# Patient Record
Sex: Female | Born: 1985 | Race: Black or African American | Hispanic: No | Marital: Single | Smoking: Never smoker
Health system: Southern US, Community
[De-identification: ages and names within clinical notes are randomized; demographics above are authoritative.]

## PROBLEM LIST (undated history)

## (undated) DIAGNOSIS — E669 Obesity, unspecified: Secondary | ICD-10-CM

## (undated) DIAGNOSIS — F410 Panic disorder [episodic paroxysmal anxiety] without agoraphobia: Secondary | ICD-10-CM

## (undated) DIAGNOSIS — Z8768 Personal history of other (corrected) conditions arising in the perinatal period: Secondary | ICD-10-CM

## (undated) DIAGNOSIS — Z8619 Personal history of other infectious and parasitic diseases: Secondary | ICD-10-CM

## (undated) DIAGNOSIS — D649 Anemia, unspecified: Secondary | ICD-10-CM

## (undated) DIAGNOSIS — Z87898 Personal history of other specified conditions: Secondary | ICD-10-CM

## (undated) DIAGNOSIS — O139 Gestational [pregnancy-induced] hypertension without significant proteinuria, unspecified trimester: Secondary | ICD-10-CM

## (undated) DIAGNOSIS — B379 Candidiasis, unspecified: Secondary | ICD-10-CM

## (undated) DIAGNOSIS — M1990 Unspecified osteoarthritis, unspecified site: Secondary | ICD-10-CM

## (undated) HISTORY — DX: Gestational (pregnancy-induced) hypertension without significant proteinuria, unspecified trimester: O13.9

## (undated) HISTORY — DX: Panic disorder (episodic paroxysmal anxiety): F41.0

## (undated) HISTORY — DX: Personal history of other infectious and parasitic diseases: Z86.19

## (undated) HISTORY — DX: Anemia, unspecified: D64.9

## (undated) HISTORY — DX: Candidiasis, unspecified: B37.9

## (undated) HISTORY — PX: INDUCED ABORTION: SHX677

## (undated) HISTORY — DX: Unspecified osteoarthritis, unspecified site: M19.90

## (undated) HISTORY — DX: Personal history of other (corrected) conditions arising in the perinatal period: Z87.68

## (undated) HISTORY — DX: Personal history of other specified conditions: Z87.898

---

## 2008-02-17 DIAGNOSIS — R87619 Unspecified abnormal cytological findings in specimens from cervix uteri: Secondary | ICD-10-CM | POA: Insufficient documentation

## 2008-02-17 HISTORY — DX: Unspecified abnormal cytological findings in specimens from cervix uteri: R87.619

## 2010-03-20 LAB — RPR
RPR: NONREACTIVE
RPR: NONREACTIVE

## 2010-03-20 LAB — GC/CHLAMYDIA PROBE AMP, GENITAL

## 2010-03-20 LAB — HIV ANTIBODY (ROUTINE TESTING W REFLEX)
HIV: NONREACTIVE
HIV: NONREACTIVE

## 2010-09-12 ENCOUNTER — Other Ambulatory Visit: Payer: Self-pay

## 2011-02-16 ENCOUNTER — Inpatient Hospital Stay (HOSPITAL_COMMUNITY): Admission: AD | Admit: 2011-02-16 | Payer: Self-pay | Source: Ambulatory Visit | Admitting: Obstetrics and Gynecology

## 2011-02-17 NOTE — L&D Delivery Note (Signed)
Delivery Note  Called by RN to room for pt c/o urge to push, complete and +2 station, prepped for delivery, pushed well over about 3 ctx,    At 12:29 PM a viable female was delivered via Vaginal, Spontaneous Delivery (Presentation: ; Occiput ).  APGAR: 9, 9; weight 6 lb 10.3 oz (3014 g).   Placenta status: Intact, Spontaneous.  Cord: 3 vessels with the following complications: None.    Anesthesia: Epidural  Episiotomy: None Lacerations: 2nd degree;Vaginal Suture Repair: 3.0 vicryl rapide Est. Blood Loss (mL): 250  Mom to postpartum.  Baby to nursery-stable. Mom plans to BF Plans OP circumcision Dr Stefano Gaul notified  Malissa Hippo 04/17/2011, 3:28 PM

## 2011-03-12 ENCOUNTER — Ambulatory Visit (HOSPITAL_COMMUNITY)
Admission: RE | Admit: 2011-03-12 | Discharge: 2011-03-12 | Disposition: A | Discharge status: Home / Self Care | Payer: Medicaid Other | Source: Ambulatory Visit | Attending: Obstetrics and Gynecology | Admitting: Obstetrics and Gynecology

## 2011-03-12 DIAGNOSIS — R809 Proteinuria, unspecified: Secondary | ICD-10-CM

## 2011-03-12 NOTE — Progress Notes (Signed)
MFM CONSULT  26 y/o G4P1021 at 35+ weeks with proteinuria.  Tina Melton has had protein ranging from trace to +1 on routine urine dipsticks through gestation.  Recent 24 hour urine on 03/02/11 revealed 378 mg total protein.  Patient has not had increased BPs and has no history of chronic hypertension.  She does report a history consistent with preeclampsia a term with her prior delivery.  She states her BPs returned to normal in the post partum period.    Tina Melton denied any other pregnancy complications or significant medical problems.  She is on no medications other than PNV.  She is obese.  She denies any significant family history of renal disease, autoimmune disorders, or genetic conditions.   Ultrasound on 02/25/11 revealed normal fetal growth and amniotic fluid volume.    Tina Melton was feeling well today and had no specific complaints.  She denied any symptoms consistent with preeclampsia.   BP 110/81. Weight 359 lbs.    An ANA was negative on 03/02/11.  CMP was WNL on 03/02/11.    Findings were discussed with Tina Melton and the following recommendations discussed.   -Recommend repeat 24 hour urine.  If this is less than 300mg , prior findings were likely a transient rise and are less of a maternal or fetal risk.  A repeat 24 hour urine could be planned after 6 weeks post partum to confirm resolution of this finding.    -If repeat 24 hour urine is increased, patient may be developing atypical preeclampsia or may have underlying renal disease that has been unmasked by pregnancy.  Close evaluation for the presence of preeclampsia in this setting is recommended including serial labs and frequent provider visits.  If there is a significant increase in proteinuria without evidence of preeclampsia, delivery at 39 weeks is recommended.  Referral to nephrology after 6 weeks post partum is also recommended in this setting.   -If repeat 24 hour urine is the same, close ongoing surveillance for the development  of preeclampsia is recommended with planned delivery at 39 weeks and repeat 24 hour urine at 6 weeks post partum.  -If preeclampsia is diagnosed, inpatient management may be of benefit due to the atypical nature of this.  If finding remain consistent with mild preeclampsia and maternal and fetal status remain stable, delivery at 37 weeks is recommended.  Deliver sooner for any evidence of severe preeclampsia or a non reassuring maternal or fetal status.   -Recommend antenatal testing with twice weekly NSTs and weekly AFI until delivery.  Repeat ultrasound for evaluation of fetal growth in 2 weeks.    -Urine culture is also recommended.  We would be please to perform any of these exams or participate further in the care of Tina Melton in any way.  Please contact us at any time, if we can be of assistance.

## 2011-03-21 LAB — HIV ANTIBODY (ROUTINE TESTING W REFLEX): HIV: NONREACTIVE

## 2011-03-21 LAB — ANTIBODY SCREEN: Antibody Screen: NEGATIVE

## 2011-03-21 LAB — ABO/RH: RH Type: POSITIVE

## 2011-03-21 LAB — GC/CHLAMYDIA PROBE AMP, GENITAL

## 2011-04-13 ENCOUNTER — Telehealth (HOSPITAL_COMMUNITY): Payer: Self-pay | Admitting: *Deleted

## 2011-04-13 ENCOUNTER — Encounter (HOSPITAL_COMMUNITY): Payer: Self-pay | Admitting: *Deleted

## 2011-04-13 NOTE — Telephone Encounter (Signed)
Preadmission screen  

## 2011-04-15 ENCOUNTER — Other Ambulatory Visit: Payer: Medicaid Other

## 2011-04-15 ENCOUNTER — Encounter (INDEPENDENT_AMBULATORY_CARE_PROVIDER_SITE_OTHER): Payer: Medicaid Other | Admitting: Obstetrics and Gynecology

## 2011-04-15 DIAGNOSIS — O99210 Obesity complicating pregnancy, unspecified trimester: Secondary | ICD-10-CM

## 2011-04-16 ENCOUNTER — Inpatient Hospital Stay (HOSPITAL_COMMUNITY)
Admission: RE | Admit: 2011-04-16 | Discharge: 2011-04-19 | DRG: 775 | Disposition: A | Discharge status: Home / Self Care | Payer: Medicaid Other | Source: Ambulatory Visit | Attending: Obstetrics and Gynecology | Admitting: Obstetrics and Gynecology

## 2011-04-16 ENCOUNTER — Encounter (HOSPITAL_COMMUNITY): Payer: Self-pay

## 2011-04-16 DIAGNOSIS — Z22330 Carrier of Group B streptococcus: Secondary | ICD-10-CM

## 2011-04-16 DIAGNOSIS — O99892 Other specified diseases and conditions complicating childbirth: Secondary | ICD-10-CM | POA: Diagnosis present

## 2011-04-16 DIAGNOSIS — O99214 Obesity complicating childbirth: Secondary | ICD-10-CM | POA: Diagnosis present

## 2011-04-16 DIAGNOSIS — O26839 Pregnancy related renal disease, unspecified trimester: Principal | ICD-10-CM | POA: Diagnosis present

## 2011-04-16 DIAGNOSIS — R809 Proteinuria, unspecified: Secondary | ICD-10-CM | POA: Diagnosis present

## 2011-04-16 DIAGNOSIS — E669 Obesity, unspecified: Secondary | ICD-10-CM | POA: Diagnosis present

## 2011-04-16 LAB — COMPREHENSIVE METABOLIC PANEL
ALT: 17 U/L (ref 0–35)
Albumin: 2.4 g/dL — ABNORMAL LOW (ref 3.5–5.2)
Alkaline Phosphatase: 132 U/L — ABNORMAL HIGH (ref 39–117)
Chloride: 101 mEq/L (ref 96–112)
Glucose, Bld: 106 mg/dL — ABNORMAL HIGH (ref 70–99)
Potassium: 4.2 mEq/L (ref 3.5–5.1)
Sodium: 134 mEq/L — ABNORMAL LOW (ref 135–145)
Total Bilirubin: 0.4 mg/dL (ref 0.3–1.2)
Total Protein: 6.2 g/dL (ref 6.0–8.3)

## 2011-04-16 LAB — CBC
HCT: 34.8 % — ABNORMAL LOW (ref 36.0–46.0)
MCV: 78.6 fL (ref 78.0–100.0)
Platelets: 297 10*3/uL (ref 150–400)
RBC: 4.43 MIL/uL (ref 3.87–5.11)
RDW: 15.4 % (ref 11.5–15.5)
WBC: 10.1 10*3/uL (ref 4.0–10.5)

## 2011-04-16 MED ORDER — OXYTOCIN BOLUS FROM INFUSION
500.0000 mL | Freq: Once | INTRAVENOUS | Status: DC
  Filled 2011-04-16: qty 500

## 2011-04-16 MED ORDER — FLEET ENEMA 7-19 GM/118ML RE ENEM: 1.0000 | ENEMA | RECTAL | Status: DC | PRN

## 2011-04-16 MED ORDER — ONDANSETRON HCL 4 MG/2ML IJ SOLN
4.0000 mg | Freq: Four times a day (QID) | INTRAMUSCULAR | Status: DC | PRN
  Administered 2011-04-17: 4 mg via INTRAVENOUS
  Filled 2011-04-16: qty 2

## 2011-04-16 MED ORDER — CITRIC ACID-SODIUM CITRATE 334-500 MG/5ML PO SOLN: 30.0000 mL | ORAL | Status: DC | PRN

## 2011-04-16 MED ORDER — OXYCODONE-ACETAMINOPHEN 5-325 MG PO TABS: 1.0000 | ORAL_TABLET | ORAL | Status: DC | PRN

## 2011-04-16 MED ORDER — OXYTOCIN 20 UNITS IN LACTATED RINGERS INFUSION - SIMPLE: 125.0000 mL/h | Freq: Once | INTRAVENOUS | Status: DC

## 2011-04-16 MED ORDER — PENICILLIN G POTASSIUM 5000000 UNITS IJ SOLR
2.5000 10*6.[IU] | INTRAVENOUS | Status: DC
  Administered 2011-04-17: 2.5 10*6.[IU] via INTRAVENOUS
  Filled 2011-04-16 (×5): qty 2.5

## 2011-04-16 MED ORDER — OXYTOCIN 20 UNITS IN LACTATED RINGERS INFUSION - SIMPLE
1.0000 m[IU]/min | INTRAVENOUS | Status: DC
  Filled 2011-04-16: qty 1000

## 2011-04-16 MED ORDER — ZOLPIDEM TARTRATE 10 MG PO TABS
10.0000 mg | ORAL_TABLET | Freq: Every evening | ORAL | Status: DC | PRN
  Administered 2011-04-16: 10 mg via ORAL
  Filled 2011-04-16: qty 1

## 2011-04-16 MED ORDER — OXYTOCIN 20 UNITS IN LACTATED RINGERS INFUSION - SIMPLE: 1.0000 m[IU]/min | INTRAVENOUS | Status: DC

## 2011-04-16 MED ORDER — LACTATED RINGERS IV SOLN
INTRAVENOUS | Status: DC
  Administered 2011-04-17: 125 mL/h via INTRAVENOUS
  Administered 2011-04-17: 950 mL via INTRAVENOUS

## 2011-04-16 MED ORDER — ACETAMINOPHEN 325 MG PO TABS: 650.0000 mg | ORAL_TABLET | ORAL | Status: DC | PRN

## 2011-04-16 MED ORDER — DINOPROSTONE 10 MG VA INST
10.0000 mg | VAGINAL_INSERT | Freq: Once | VAGINAL | Status: AC
  Administered 2011-04-16: 10 mg via VAGINAL
  Filled 2011-04-16 (×2): qty 1

## 2011-04-16 MED ORDER — TERBUTALINE SULFATE 1 MG/ML IJ SOLN: 0.2500 mg | Freq: Once | INTRAMUSCULAR | Status: AC | PRN

## 2011-04-16 MED ORDER — PENICILLIN G POTASSIUM 5000000 UNITS IJ SOLR
5.0000 10*6.[IU] | Freq: Once | INTRAVENOUS | Status: AC
  Administered 2011-04-17: 5 10*6.[IU] via INTRAVENOUS
  Filled 2011-04-16: qty 5

## 2011-04-16 MED ORDER — IBUPROFEN 600 MG PO TABS: 600.0000 mg | ORAL_TABLET | Freq: Four times a day (QID) | ORAL | Status: DC | PRN

## 2011-04-16 MED ORDER — ZOLPIDEM TARTRATE 10 MG PO TABS: 10.0000 mg | ORAL_TABLET | Freq: Every evening | ORAL | Status: DC | PRN

## 2011-04-16 MED ORDER — LACTATED RINGERS IV SOLN
500.0000 mL | INTRAVENOUS | Status: DC | PRN
  Administered 2011-04-17: 500 mL via INTRAVENOUS

## 2011-04-16 MED ORDER — LIDOCAINE HCL (PF) 1 % IJ SOLN: 30.0000 mL | INTRAMUSCULAR | Status: DC | PRN

## 2011-04-16 MED ORDER — BUTORPHANOL TARTRATE 2 MG/ML IJ SOLN
1.0000 mg | INTRAMUSCULAR | Status: DC | PRN
  Administered 2011-04-17 (×3): 1 mg via INTRAVENOUS
  Filled 2011-04-16 (×3): qty 1

## 2011-04-16 NOTE — Progress Notes (Signed)
Manfred Arch CNM notified that pt pulled out her cervidil while going to the bathroom and flushed it. Orders received to place another cervidil.

## 2011-04-17 ENCOUNTER — Encounter (HOSPITAL_COMMUNITY): Payer: Self-pay | Admitting: Anesthesiology

## 2011-04-17 ENCOUNTER — Inpatient Hospital Stay (HOSPITAL_COMMUNITY): Payer: Medicaid Other | Admitting: Anesthesiology

## 2011-04-17 ENCOUNTER — Encounter (HOSPITAL_COMMUNITY): Payer: Self-pay

## 2011-04-17 DIAGNOSIS — E669 Obesity, unspecified: Secondary | ICD-10-CM | POA: Diagnosis present

## 2011-04-17 DIAGNOSIS — Z22330 Carrier of Group B streptococcus: Secondary | ICD-10-CM

## 2011-04-17 DIAGNOSIS — R809 Proteinuria, unspecified: Secondary | ICD-10-CM

## 2011-04-17 DIAGNOSIS — O10919 Unspecified pre-existing hypertension complicating pregnancy, unspecified trimester: Secondary | ICD-10-CM

## 2011-04-17 HISTORY — DX: Proteinuria, unspecified: R80.9

## 2011-04-17 HISTORY — DX: Carrier of group B Streptococcus: Z22.330

## 2011-04-17 MED ORDER — DIPHENHYDRAMINE HCL 25 MG PO CAPS: 25.0000 mg | ORAL_CAPSULE | Freq: Four times a day (QID) | ORAL | Status: DC | PRN

## 2011-04-17 MED ORDER — IBUPROFEN 600 MG PO TABS
600.0000 mg | ORAL_TABLET | Freq: Four times a day (QID) | ORAL | Status: DC
  Administered 2011-04-17 – 2011-04-19 (×8): 600 mg via ORAL
  Filled 2011-04-17 (×8): qty 1

## 2011-04-17 MED ORDER — SENNOSIDES-DOCUSATE SODIUM 8.6-50 MG PO TABS
2.0000 | ORAL_TABLET | Freq: Every day | ORAL | Status: DC
  Administered 2011-04-17: 2 via ORAL

## 2011-04-17 MED ORDER — WITCH HAZEL-GLYCERIN EX PADS
1.0000 "application " | MEDICATED_PAD | CUTANEOUS | Status: DC | PRN
  Administered 2011-04-18: 1 via TOPICAL

## 2011-04-17 MED ORDER — MEASLES, MUMPS & RUBELLA VAC ~~LOC~~ INJ
0.5000 mL | INJECTION | Freq: Once | SUBCUTANEOUS | Status: DC
  Filled 2011-04-17: qty 0.5

## 2011-04-17 MED ORDER — TETANUS-DIPHTH-ACELL PERTUSSIS 5-2.5-18.5 LF-MCG/0.5 IM SUSP
0.5000 mL | Freq: Once | INTRAMUSCULAR | Status: AC
  Administered 2011-04-19: 0.5 mL via INTRAMUSCULAR

## 2011-04-17 MED ORDER — BENZOCAINE-MENTHOL 20-0.5 % EX AERO: 1.0000 "application " | INHALATION_SPRAY | CUTANEOUS | Status: DC | PRN

## 2011-04-17 MED ORDER — OXYCODONE-ACETAMINOPHEN 5-325 MG PO TABS: 1.0000 | ORAL_TABLET | ORAL | Status: DC | PRN

## 2011-04-17 MED ORDER — FENTANYL 2.5 MCG/ML BUPIVACAINE 1/10 % EPIDURAL INFUSION (WH - ANES)
INTRAMUSCULAR | Status: DC | PRN
  Administered 2011-04-17: 14 mL/h via EPIDURAL

## 2011-04-17 MED ORDER — DIPHENHYDRAMINE HCL 50 MG/ML IJ SOLN: 12.5000 mg | INTRAMUSCULAR | Status: DC | PRN

## 2011-04-17 MED ORDER — FENTANYL 2.5 MCG/ML BUPIVACAINE 1/10 % EPIDURAL INFUSION (WH - ANES)
14.0000 mL/h | INTRAMUSCULAR | Status: DC
  Administered 2011-04-17: 14 mL/h via EPIDURAL
  Filled 2011-04-17 (×2): qty 60

## 2011-04-17 MED ORDER — ZOLPIDEM TARTRATE 5 MG PO TABS: 5.0000 mg | ORAL_TABLET | Freq: Every evening | ORAL | Status: DC | PRN

## 2011-04-17 MED ORDER — PHENYLEPHRINE 40 MCG/ML (10ML) SYRINGE FOR IV PUSH (FOR BLOOD PRESSURE SUPPORT)
80.0000 ug | PREFILLED_SYRINGE | INTRAVENOUS | Status: DC | PRN
  Filled 2011-04-17: qty 5

## 2011-04-17 MED ORDER — SODIUM BICARBONATE 8.4 % IV SOLN
INTRAVENOUS | Status: DC | PRN
  Administered 2011-04-17: 5 mL via EPIDURAL

## 2011-04-17 MED ORDER — EPHEDRINE 5 MG/ML INJ
10.0000 mg | INTRAVENOUS | Status: DC | PRN
  Filled 2011-04-17: qty 4

## 2011-04-17 MED ORDER — PRENATAL MULTIVITAMIN CH
1.0000 | ORAL_TABLET | Freq: Every day | ORAL | Status: DC
  Administered 2011-04-18 – 2011-04-19 (×2): 1 via ORAL
  Filled 2011-04-17 (×2): qty 1

## 2011-04-17 MED ORDER — PHENYLEPHRINE 40 MCG/ML (10ML) SYRINGE FOR IV PUSH (FOR BLOOD PRESSURE SUPPORT): 80.0000 ug | PREFILLED_SYRINGE | INTRAVENOUS | Status: DC | PRN

## 2011-04-17 MED ORDER — EPHEDRINE 5 MG/ML INJ: 10.0000 mg | INTRAVENOUS | Status: DC | PRN

## 2011-04-17 MED ORDER — LACTATED RINGERS IV SOLN: 500.0000 mL | Freq: Once | INTRAVENOUS | Status: DC

## 2011-04-17 MED ORDER — DINOPROSTONE 10 MG VA INST
10.0000 mg | VAGINAL_INSERT | Freq: Once | VAGINAL | Status: AC
  Administered 2011-04-16: 10 mg via VAGINAL
  Filled 2011-04-17: qty 1

## 2011-04-17 MED ORDER — ONDANSETRON HCL 4 MG/2ML IJ SOLN: 4.0000 mg | INTRAMUSCULAR | Status: DC | PRN

## 2011-04-17 MED ORDER — OXYTOCIN 20 UNITS IN LACTATED RINGERS INFUSION - SIMPLE
1.0000 m[IU]/min | INTRAVENOUS | Status: DC
  Administered 2011-04-17: 1 m[IU]/min via INTRAVENOUS

## 2011-04-17 MED ORDER — SIMETHICONE 80 MG PO CHEW: 80.0000 mg | CHEWABLE_TABLET | ORAL | Status: DC | PRN

## 2011-04-17 MED ORDER — LANOLIN HYDROUS EX OINT: TOPICAL_OINTMENT | CUTANEOUS | Status: DC | PRN

## 2011-04-17 MED ORDER — DIBUCAINE 1 % RE OINT: 1.0000 "application " | TOPICAL_OINTMENT | RECTAL | Status: DC | PRN

## 2011-04-17 MED ORDER — ONDANSETRON HCL 4 MG PO TABS: 4.0000 mg | ORAL_TABLET | ORAL | Status: DC | PRN

## 2011-04-17 NOTE — H&P (Signed)
Tina Melton is a 26 y.o. female , [id number] at 40 weeks presenting for induction due to unexplained proteinuria, per recommendation of MFM.  Denies HA, visual symptoms, or epigastric pain.  Pregnancy remarkable for: Unexplained proteinuria, with last 24 hour urine 1/14 of 348. Morbid obesity Positive GBS bacteruria Hx occasional panic attacks Hx elevated BP last pregnancy ? SGA last pregnancy, with 5+10 weight at 43 weeks FH polydactly Hx of gestational hypertension with 2005 pregnancy   History of present pregnancy: Patient entered care at 9 weeks.  EDC of 04/15/10 was established by LMP and in agreement with 1st trimester screening.  GBS bacteruria was noted after NOB visit.  Anatomy scan was done at 18 weeks, with normal findings.  Further ultrasounds were done at 33, 36, 37, and 39 weeks, with normal growth and fluid, normal BPPs.  Early glucola and 28 week glucola were WNL.  She had proteinuria first noted at 21 weeks, with BP 120/80.  24 hour urine was recommended after that visit, but patient did not do this until 03/02/11, with result of 348.  Patient was sent to MFM for referral, with recommendation for induction at 39 weeks if only proteinuria--but patient declined induction until 40 weeks.  She did not have another 24 hour urine.  Her PIH labs have been WNL.  ANA titer was negative.  OB History    Grav Para Term Preterm Abortions TAB SAB Ect Mult Living   4 1 1  0 2 1 1  0 0 1    #1--2001 1st trimester SAB #2--2005 SVB female 5+10 43 weeks after 10 hours labor, no meds #3--TAB at 9 weeks #4--Current  Past Medical History  Diagnosis Date  . History of chicken pox   . Anemia   . History of hemorrhage specific to perinatal period     6-7 mos bleeding x2, heavy bldg with 2nd preg was told baby nicked 2 blood vessels  . History of chlamydia   . Panic attacks    History reviewed. No pertinent past surgical history.  TAB 2009  Family History: family history includes Arthritis in her  father and paternal grandmother; Autoimmune disease in her paternal grandmother; Birth defects in her son; Dementia in her maternal grandmother; Diabetes in her maternal grandfather; Hypertension in her maternal grandmother; Mental illness in her mother; Rheum arthritis in her father; and Stroke in her maternal grandmother.  Social History:  reports that she has never smoked. She has never used smokeless tobacco. She reports that she does not drink alcohol or use illicit drugs. 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  FOB, [employer]per, with high school education, employed in [employer].  ROS:  Occasional contractions, positive FM  Cervix posterior, 2 cm, 50%, vtx -2.  Blood pressure 134/72, pulse 99, temperature 98.1 F (36.7 C), temperature source Oral, resp. rate 20, height 5\' 11"  (1.803 m), weight 163.295 kg (360 lb), last menstrual period 07/10/2010.  Chest clear  Heart RRR without murmur Abd gravid, NT, significant pannus Ext WNL  FHR reactive, no decels UCs irregular, mild  Prenatal labs: ABO, Rh: B/Positive/-- (02/02 0000) Antibody: Negative (02/02 0000) Rubella:  Immune RPR: NON REACTIVE (02/28 2120)  HBsAg: Negative (02/02 0000)  HIV: Non-reactive (02/02 0000)  GBS: Positive (02/02 0000)  Normal 1st trimester screen and AFP 24 hour urine 348 02/28/11 Sickle cell negative PIH labs WNL during pregnancy Hgb 11.4 at NOB/  Assessment/Plan: IUP at 40 weeks Unexplained proteinuria  Morbid obesity GBS positive   Plan: Admit to Birthing Suite per consult with Dr. Estanislado Pandy Routine CNM orders Plan Cervidil per consult with Dr. Estanislado Pandy GBS Rx when pitocin begun or if active labor ensues before that Check [facility] labs with admission labs.   Nigel Bridgeman 04/16/11 9:30pm

## 2011-04-17 NOTE — Progress Notes (Signed)
UR chart review completed.  

## 2011-04-17 NOTE — Progress Notes (Signed)
  Subjective: Requesting pain medication.  Cervidil place at 10:30pm.    Objective: BP 108/74  Pulse 80  Temp(Src) 98 F (36.7 C) (Oral)  Resp 20  Ht 5\' 11"  (1.803 m)  Wt 163.295 kg (360 lb)  BMI 50.21 kg/m2  LMP 07/10/2010      FHT:  Category 1 (using Beacon monitor) UC:   regular, every 2-4 minutes SVE:   Dilation: 2 Effacement (%): 50 Station: -2;-1 Exam by:: Manfred Arch CNM Cervix soft, more anterior. Cervidil hanging out of vagina--removed  Labs: Lab Results  Component Value Date   WBC 10.1 04/16/2011   HGB 11.4* 04/16/2011   HCT 34.8* 04/16/2011   MCV 78.6 04/16/2011   PLT 297 04/16/2011    Assessment / Plan: Induction of labor due to unexplained proteinuria Elevated LDH (had reviewed with Dr. Estanislado Pandy) Early labor s/p cervidil  Plan: Cervidil removed. Will start low dose pitocin and GBS Rx now Stadol for pain  Tina Melton 04/17/2011, 3:02 AM

## 2011-04-17 NOTE — Progress Notes (Signed)
Patient ID: ARIELLAH FAUST, female   DOB: 14-Nov-1985, 26 y.o.   MRN: [id number] .Subjective: Getting epidural, pt very distressed, crying, screaming, states she doesn't want the baby, pt's mother at bs is tearful,    Objective: BP 101/47  Pulse 139  Temp(Src) 97.6 F (36.4 C) (Oral)  Resp 20  Ht 5\' 11"  (1.803 m)  Wt 163.295 kg (360 lb)  BMI 50.21 kg/m2  SpO2 96%  LMP 07/10/2010   FHT:  FHR: 130 bpm, variability: moderate,  accelerations:  Present,  decelerations:  Absent UC:   irregular, every 4-5 minutes SVE:   Dilation: 2.5 Effacement (%): 90 Station: -1;-2 Exam by:: Manfred Arch  Pitocin was on 1mu, turned off while awaiting for epidural  Assessment / Plan: IOL secondary to proteinuria, had cervidil overnight, now on pitocin GBS pos - rcvd PCN   Fetal Wellbeing:  Category I Pain Control:  Epidural  Dr Stefano Gaul updated  Malissa Hippo 04/17/2011, 8:52 AM

## 2011-04-17 NOTE — Anesthesia Procedure Notes (Addendum)
Epidural Patient location during procedure: OB  Preanesthetic Checklist Completed: patient identified, site marked, surgical consent, pre-op evaluation, timeout performed, IV checked, risks and benefits discussed and monitors and equipment checked  Epidural Patient position: sitting Prep: site prepped and draped and DuraPrep Patient monitoring: continuous pulse ox and blood pressure Approach: midline Injection technique: LOR air  Needle:  Needle type: Tuohy  Needle gauge: 17 G Needle length: 9 cm Needle insertion depth: 9 cm Catheter type: closed end flexible Catheter size: 19 Gauge Test dose: negative  Assessment Events: blood not aspirated, injection not painful, no injection resistance, negative IV test and no paresthesia  Additional Notes Dosing of Epidural:  1st dose, through catheter ............................................Marland Kitchen epi 1:200K + Xylocaine 40 mg  2nd dose, through catheter, after waiting 3 minutes...Marland KitchenMarland Kitchenepi 1:200K + Xylocaine 60 mg  3rd dose, through catheter after waiting 3 minutes .............................Marcaine   5mg    ( mg Marcaine are expressed as equivilent  cc's medication removed from the 0.1%Bupiv / fentanyl syringe from L&D pump)  ( 2% Xylo charted as a single dose in Epic Meds for ease of charting; actual dosing was fractionated as above, for saftey's sake)  As each dose occurred, patient was free of IV sx; and patient exhibited no evidence of SA injection.  Patient is more comfortable after epidural dosed. Please see RN's note for documentation of vital signs,and FHR which are stable.

## 2011-04-17 NOTE — Progress Notes (Signed)
Patient ID: Tina Melton, female   DOB: 19-Jul-1985, 26 y.o.   MRN: [id number] .Subjective:  Comfortable with epidural  Objective: BP 105/54  Pulse 117  Temp(Src) 97.7 F (36.5 C) (Axillary)  Resp 18  Ht 5\' 11"  (1.803 m)  Wt 163.295 kg (360 lb)  BMI 50.21 kg/m2  SpO2 96%  LMP 07/10/2010   FHT:  FHR: 130 bpm, variability: moderate,  accelerations:  Present,  decelerations:  Present early variables UC:   toco not tracing well, ctx irreg SVE:   Dilation: 2.5 Effacement (%): 90 Station: -1;-2 Exam by:: Manfred Arch  VE=6/100/0 IUPC placed Pitocin at 1mu  Assessment / Plan: Induction of labor due to proteinuria,  progressing well on pitocin GBS pos   Fetal Wellbeing:  Category I Pain Control:  Epidural  Update physician PRN  Sopheap Basic M 04/17/2011, 10:09 AM

## 2011-04-17 NOTE — Anesthesia Preprocedure Evaluation (Addendum)
Anesthesia Evaluation  Patient identified by MRN, date of birth, ID band Patient awake    Reviewed: Allergy & Precautions, H&P , Patient's Chart, lab work & pertinent test results  Airway Mallampati: II TM Distance: >3 FB Neck ROM: full    Dental  (+) Teeth Intact   Pulmonary  clear to auscultation        Cardiovascular regular Normal    Neuro/Psych    GI/Hepatic   Endo/Other  Morbid obesity  Renal/GU      Musculoskeletal   Abdominal   Peds  Hematology   Anesthesia Other Findings       Reproductive/Obstetrics (+) Pregnancy                           Anesthesia Physical Anesthesia Plan  ASA: III  Anesthesia Plan: Epidural   Post-op Pain Management:    Induction:   Airway Management Planned:   Additional Equipment:   Intra-op Plan:   Post-operative Plan:   Informed Consent: I have reviewed the patients History and Physical, chart, labs and discussed the procedure including the risks, benefits and alternatives for the proposed anesthesia with the patient or authorized representative who has indicated his/her understanding and acceptance.   Dental Advisory Given  Plan Discussed with:   Anesthesia Plan Comments: (Labs checked- platelets confirmed with RN in room. Fetal heart tracing, per RN, reported to be stable enough for sitting procedure. Discussed epidural, and patient consents to the procedure:  included risk of possible headache,backache, failed block, allergic reaction, and nerve injury. This patient was asked if she had any questions or concerns before the procedure started. )        Anesthesia Quick Evaluation  

## 2011-04-17 NOTE — Progress Notes (Signed)
  Subjective: Requesting more pain medication.  Breathing and moaning with contractions, moving frequently in bed.  Objective: BP 113/68  Pulse 85  Temp(Src) 98.1 F (36.7 C) (Oral)  Resp 22  Ht 5\' 11"  (1.803 m)  Wt 163.295 kg (360 lb)  BMI 50.21 kg/m2  LMP 07/10/2010      FHT:  Category 1  UC:   regular, every 2-3 minutes, moderate to palpation SVE:   Dilation: 2.5 Effacement (%): 90 Station: -1;-2 Exam by:: Manfred Arch Probable transverse position of vtx  Pitocin on 1 mu/min  Assessment / Plan: Induction of labor due to proteinuria Laboring after Cervidil, now on pitocin  Will continue to observe Will check in 1 hour for further descent, with AROM as option.  Nigel Bridgeman 04/17/2011, 5:44 AM

## 2011-04-18 LAB — CBC
HCT: 29.8 % — ABNORMAL LOW (ref 36.0–46.0)
MCH: 25.7 pg — ABNORMAL LOW (ref 26.0–34.0)
MCHC: 32.2 g/dL (ref 30.0–36.0)
MCV: 79.9 fL (ref 78.0–100.0)
RDW: 15.1 % (ref 11.5–15.5)
WBC: 11.1 10*3/uL — ABNORMAL HIGH (ref 4.0–10.5)

## 2011-04-18 MED ORDER — HYDROCHLOROTHIAZIDE 25 MG PO TABS
25.0000 mg | ORAL_TABLET | Freq: Every day | ORAL | Status: DC
  Administered 2011-04-18 – 2011-04-19 (×2): 25 mg via ORAL
  Filled 2011-04-18 (×2): qty 1

## 2011-04-18 NOTE — Progress Notes (Addendum)
Post Partum Day 1 Subjective: no complaints, up ad lib, voiding, tolerating PO and + flatus  Objective: Blood pressure 97/59, pulse 76, temperature 97.7 F (36.5 C), temperature source Oral, resp. rate 16, height 5\' 11"  (1.803 m), weight 163.295 kg (360 lb), last menstrual period 07/10/2010, SpO2 98.00%, unknown if currently breastfeeding.  Physical Exam:  General: alert Lochia: appropriate Uterine Fundus: firm Incision: NA DVT Evaluation: No evidence of DVT seen on physical exam.   Basename 04/18/11 0500 04/16/11 2120  HGB 9.6* 11.4*  HCT 29.8* 34.8*    Assessment/Plan: Plan for discharge tomorrow Breast Feeding. Add HCTZ.   LOS: 2 days   Tina Melton V 04/18/2011, 11:31 AM

## 2011-04-18 NOTE — Anesthesia Postprocedure Evaluation (Signed)
  Anesthesia Post-op Note  Patient: Tina Melton  Procedure(s) Performed: * No procedures listed *  Patient Location: PACU and Mother/Baby  Anesthesia Type: Epidural  Level of Consciousness: awake, alert  and oriented  Airway and Oxygen Therapy: Patient Spontanous Breathing   Post-op Assessment: Patient's Cardiovascular Status Stable and Respiratory Function Stable  Post-op Vital Signs: stable  Complications: No apparent anesthesia complications

## 2011-04-19 MED ORDER — IBUPROFEN 600 MG PO TABS: 600.0000 mg | ORAL_TABLET | Freq: Four times a day (QID) | ORAL | Status: AC | PRN

## 2011-04-19 MED ORDER — FERRALET 90 90-1 MG PO TABS: 1.0000 | ORAL_TABLET | Freq: Every day | ORAL | Status: DC

## 2011-04-19 MED ORDER — HYDROCHLOROTHIAZIDE 25 MG PO TABS: 25.0000 mg | ORAL_TABLET | Freq: Every day | ORAL | Status: DC

## 2011-04-19 NOTE — Discharge Instructions (Signed)
Postpartum Care After Vaginal Delivery After you deliver your baby, you will stay in the hospital for 24 to 72 hours, unless there were problems with the labor or delivery, or you have medical problems. While you are in the hospital, you will receive help and instructions on how to care for yourself and your baby. Your doctor will order pain medicine, in case you need it. You will have a small amount of bleeding from your vagina and should change your sanitary pad frequently. Wash your hands thoroughly with soap and water for at least 20 seconds after changing pads and using the toilet. Let the nurses know if you begin to pass blood clots or your bleeding increases. Do not flush blood clots down the toilet before having the nurse look at them, to make sure there is no placental tissue with them. If you had an intravenous (IV), it will be removed within 24 hours, if there are no problems. The first time you get out of bed or take a shower, call the nurse to help you because you may get weak, lightheaded, or even faint. If you are breastfeeding, you may feel painful contractions of your uterus for a couple of weeks. This is normal. The contractions help your uterus get back to normal size. If you are not breastfeeding, wear a supportive bra and handle your breasts as little as possible until your milk has dried up. Hormones should not be given to dry up the breasts, because they can cause blood clots. You will be given your normal diet, unless you have diabetes or other medical problems.  The nurses may put an ice pack on your episiotomy (surgically enlarged opening), if you have one, to reduce the pain and swelling. On rare occasions, you may not be able to urinate and the nurse will need to empty your bladder with a catheter. If you had a postpartum tubal ligation ("tying tubes," female sterilization), it should not make your stay in the hospital longer. You may have your baby in your room with you as much as  you like, unless you or the baby has a problem. Use the bassinet (basket) for the baby when going to and from the nursery. Do not carry the baby. Do not leave the postpartum area. If the mother is Rh negative (lacks a protein on the red blood cells) and the baby is Rh positive, the mother should get a Rho-gam shot to prevent Rh problems with future pregnancies. You may be given written instructions for you and your baby, and necessary medicines, when you are discharged from the hospital. Be sure you understand and follow the instructions as advised. HOME CARE INSTRUCTIONS   Follow instructions and take the medicines given to you.   Only take over-the-counter or prescription medicines for pain, discomfort, or fever as directed by your caregiver.   Do not take aspirin, because it can cause bleeding.   Increase your activities a little bit every day to build up your strength and endurance.   Do not drink alcohol, especially if you are breastfeeding or taking pain medicine.   Take your temperature twice a day and record it.   You may have a small amount of bleeding or spotting for 2 to 4 weeks. This is normal.   Do not use tampons or douche. Use sanitary pads.   Try to have someone stay and help you for a few days when you go home.   Try to rest or take a nap when   the baby is sleeping.   If you are breastfeeding, wear a good support bra. If you are not breastfeeding, wear a supportive bra and do not stimulate your nipples.   Eat a healthy, nutritious diet and continue to take your prenatal vitamins.   Do not drive, do any heavy activities, or travel until your caregiver tells you it is okay.   Do not have intercourse until your caregiver gives you permission to do so.   Ask your caregiver when you can begin to exercise and what type of exercises to do.   Call your caregiver if you think you are having a problem from your delivery.   Call your pediatrician if you are having a problem  with the baby.   Schedule your postpartum visit and keep it.  SEEK MEDICAL CARE IF:   You have a temperature of 100 F (37.8 C) or higher.   You have increased vaginal bleeding or are passing clots. Save any clots to show your caregiver.   You have bloody urine or pain when you urinate.   You have a bad smelling vaginal discharge.   You have increasing pain or swelling on your episiotomy.   You develop a severe headache.   You feel depressed.   The episiotomy is separating.   You become dizzy or lightheaded.   You develop a rash.   You have a reaction or problems with your medicine.   You have pain, redness, or swelling at the intravenous site.  SEEK IMMEDIATE MEDICAL CARE IF:   You have chest pain.   You develop shortness of breath.   You pass out.   You develop pain, with or without swelling or redness in your leg.   You develop heavy vaginal bleeding, with or without blood clots.   You develop stomach pain.   You develop a bad smelling vaginal discharge.  MAKE SURE YOU:   Understand these instructions.   Will watch your condition.   Will get help right away if you are not doing well or get worse.  Document Released: 11/30/2006 Document Revised: 01/22/2011 Document Reviewed: 12/12/2008 [employer] Patient Information 2012 [employer]  Breastfeeding Challenges Breastfeeding is often the best way to feed your baby. Challenges may discourage you from breastfeeding. But solutions can usually be found to help you. Some babies have conditions that may interfere with or make breastfeeding more difficult. But, in all of the following cases, breastfeeding is still best for a baby's health.  ADVANTAGES OF BREASTFEEDING  Breastfed babies tend to be healthier and less affected by disease.   Breastfed babies may have better brain development and be less likely to be overweight than formulafed babies.   Breastfeeding also benefits the mother. It will give you time  to be close to your baby and helps create a strong bond. It also:   Delays the return of your periods.   Stimulates your uterus to contract back to normal.   Helps you lose some of the weight you gained during pregnancy.   Breastfeeding is also cheaper than formula feeding. It also does not require mixing formula, heating bottles, or washing extra dishes.   Breastfeeding mothers have a lower risk of developing breast cancer.   Breastfeeding should be encouraged in women with gestational diabetes and diabetes type I and type II.  BREASTFEEDING CHALLENGES  Breastfeeding involves taking the time to get to know your baby's patterns and responding to his or her cues. Once breastfeeding is well established, feedings usually   become more regular and more widely spaced. Some mothers do not nurse their babies because they come across problems early on. If at all possible, begin breastfeeding your baby within an hour after delivery. The first milk you produce is called colostrum. It is packed with nutrients and disease-fighting substances. These will help nourish and protect your baby against infections as he or she grows up.   Some babies are unable to breastfeed because of premature birth and small size along with weakness and difficulty sucking. Sometimes with birth defects of the mouth (cleft lip or cleft palate) the mother may be able to pump breastmilk to give to her baby. Some digestive problems (breast milk jaundice, galactosemia) may be reasons not to nurse. See a lactation consultant if you have a breast infection or breast abscess, breast cancer or other cancer, previous surgery or radiation treatment, or inadequate milk supply (uncommon).  SOME MOTHERS ARE ADVISED NOT TO BREASTFEED DUE TO HEALTH PROBLEMS SUCH AS:  Serious illnesses.   Severe malnutrition.   Undergoing radiation therapy.   Taking psychiatric medication.   Active herpes lesions on the breast.   Chickenpox or shingles.     Active, untreated tuberculosis.   HIV (human immunodeficiency virus) infection.   Drug or alcohol addiction.   Undergoing radioactive iodine therapy.   Leukemia human cell Type I or II.  BREASTFEEDING SHOULD NOT BE PAINFUL  It is natural for minor problems to arise in first time breastfeeding. Problems you may have and some solutions are as follows:   Nipple soreness may be caused by:   Improper position of baby.   Improper feeding techniques.   Improper nipple care.   For many women, there is no identified cause. A simple change in your baby's position while feeding may relieve nipple soreness. Some breastfeeding mothers report nipple soreness only during the initial adjustment period.   If there is tenderness at first, it should gradually go away as the days go by. Poor latch-on and positioning are common causes of sore nipples. This is usually because the baby is not getting enough of the areola into his or her mouth, and is sucking mostly on the nipple. The areola is the colored portion around the nipple. In general, nurse early and often. Nurse with the nipple and areola in the baby's mouth, not just the nipple. And feed your baby on demand.   If you have sore nipples and put off feedings because of the pain, this can lead to your breasts becoming overly full. This may lead to plugged milk ducts in the breast followed by engorgement or even infection of the breasts. If your baby is latched on correctly, he or she should be able to nurse as long as needed without causing any pain. If it hurts, take the baby off of your breast and try again. Ask for help if it is still painful for you.   Check the positioning of your baby's body and the way she latches on and sucks. To minimize soreness, your baby's mouth should be open wide with as much of the areola in his or her mouth as possible. You should find that it feels better right away once the baby is positioned correctly.   Do not  delay feedings. Try to relax so your let-down reflex comes easily. You also can hand-express a little milk before beginning the feeding so your baby does not clamp down harder, waiting for the milk to come.   If your nipples are very   sore, it may be helpful to change positions each time you nurse. This puts the pressure on a different part of the nipple.   After nursing, you can also express a few drops of milk and gently rub it on your nipples. Human milk has natural healing properties. Let your nipples air-dry after feeding, or wear a soft-cotton shirt.   Wearing a nipple shield during nursing will not relieve sore nipples. They actually can prolong soreness by making it hard for the baby to learn to nurse without the shield.   Avoid wearing bras or clothes that are too tight and put pressure on your nipples. If you wear a bra, get one that offers good support to your breasts.   Change nursing pads often to avoid trapping in moisture. Only use cotton pads.   Avoid using soap, ointments or other chemicals on your nipples. Make sure to avoid products that must be removed before nursing. Washing with clean water is all that is necessary to keep your nipples and breasts clean.   Try rubbing pure lanolin on your nipples after breastfeeding to soothe the pain.   Making sure you get enough rest, eating healthy foods, and getting enough fluids also can help the healing process. If you have very sore nipples, you can ask your caregiver about using non-aspirin pain relievers.   Another cause of sore nursing is a condition called thrush. This is a fungal infection that can form on your nipples from the milk. Other signs of thrush include itching, flaking and drying skin, tender or pink skin. The infection also can form in the baby's mouth from having contact with your nipples. There it appears as little Dolecki spots on the inside of the cheeks, gums, or tongue. It also can appear as a diaper rash on your  baby that will not go away by using regular diaper rash ointments. If you have any of these symptoms or think you have thrush, contact your doctor and your baby's doctor, or a lactation consultant. A lactation consultant is a breastfeeding consultant or expert. You can get medication for your nipples and for your baby.   If you still have sore nipples after following the above tips, you may need to see someone who is trained in breastfeeding, like a lactation consultant.  ENGORGEMENT Engorgement is a condition after pregnancy, when your breasts feel very hard and painful. You also may have breast swelling, tenderness, warmth, redness, throbbing and flattening of the nipple. Engorgement may cause a low-grade fever. This can be confused with a breast infection. Engorgement is the result of the milk building up. It usually happens during the third to fifth day after birth. This slows circulation. When blood and lymph move through the breasts, fluid from the blood vessels can seep into the breast tissues. All of the following can cause engorgement.  Poor positioning.   Infrequent feedings.   Giving supplementary bottles of water, juice, formula, or breast milk or using a pacifier. All of these cut down on your feeding and may lead to engorgement.   Changing the breastfeeding schedule with decreasing in feeding.   The baby changes the nursing pattern.   Having a baby with a weak suck who is not able to nurse effectively.   Fatigue, stress, or anemia in the mother.   An overabundant milk supply.   Nipple damage.   Breast abnormalities.  Engorgement can lead to plugged ducts or a breast infection. So it is important to try to prevent   it before this happens. If treated properly, engorgement should only last for one to two days.  Minimize engorgement by making sure the baby is latched on and positioned correctly at your breast.   Nurse frequently after birth. Allow the baby to nurse as long as  he/she likes, as long as he/she is latched on well and sucking effectively.   In the early days when your milk is coming in, you should awaken a sleepy baby every 2 to 3 hours to breastfeed. Breastfeeding often on the affected side helps to remove the milk and keeps milk moving freely. This prevents overfilling of the breast.   Avoid additional bottles and pacifiers.   Try hand expressing or pumping a little milk to first soften the breast, areola, and nipple before breastfeeding. Or massage the breast before feeding.   Cold compresses in between feedings can help ease pain and swelling.   If you are returning to work, try to pump your milk on the same schedule your baby was fed.   Eat a well balanced diet and drink plenty of fluids.   Use a well-fitting, supportive bra that is not too tight.   If your engorgement lasts for more than two days even after treating it, contact a lactation consultant.   Use a breast pump to keep up with your nursing schedule.   Use a breast pump if your baby is not taking enough milk or you feel you may be getting engorged.  PLUGGED DUCTS AND INFECTION Plugged ducts and breast infection (mastitis) are common sources of sore breasts postpartum. It is common for many women to have a plugged duct in the breast at some point if breastfeeding.   A plugged milk duct feels like a tender, sore, lump in the breast. It is not accompanied by a fever or other symptoms. It happens when a milk duct does not properly drain. Then, pressure builds up behind the plug, and surrounding tissue becomes inflamed. A plugged duct usually only occurs in one breast at a time.   A breast infection (mastitis), on the other hand, is soreness or a lump in the breast that can be accompanied by a fever and/or flu-like symptoms. You may feel run down or very achy. Some women with a breast infection also have nausea and vomiting. You also may have yellowish discharge from the nipple that looks  like colostrum. Or the breasts feel warm or hot to the touch and appear pink or red. A breast infection can occur when other family members have a cold or the flu. Like a plugged duct, it usually only occurs in one breast. It is not always easy to tell the difference between a breast infection and a plugged duct. Both have similar symptoms and can improve within 24 to 48 hours.   Treatment for plugged ducts and breast infections is similar. But some breast infections need to also be treated with an antibiotic.   If mastitis is not treated quickly, it may lead to a breast abscess.   It may help to massage the area, starting behind the sore spot. Use your fingers in a circular motion and massage toward the nipple.   Breastfeed more often on the affected side. This helps loosen the plug, keeps the milk moving freely, and the breast from becoming overly full. Nursing every two hours, both day and night on the affected side first can be helpful.   Getting extra sleep or relaxing with your feet up can help speed   healing. Often a plugged duct or breast infection is the first sign that a mother is doing too much and becoming overly tired.   Wear a well-fitting supportive bra that is not too tight, since this can constrict milk ducts.   If you do not feel better within 24 hours of trying these steps, and you have a fever or your symptoms worsen, call your doctor. You may need an antibiotic. Also, if you have a breast infection in which both breasts look affected, or there is pus or blood in the milk, red streaks near the area, or your symptoms came on severely and suddenly, see your doctor right away.   Even if you need an antibiotic, continuing to breastfeed during treatment is best for both you and your baby. Most antibiotics will not affect your baby through your breast milk.  THRUSH Thrush (yeast) is a fungal infection that can form on your nipples or in your breast because it thrives on milk. The  infection forms from an overgrowth of the candida organism. Candida usually exists in our bodies and is kept at healthy levels by the natural bacteria in our bodies. But, when the natural balance of bacteria is upset, candida can overgrow, causing an infection. Some of the things that can cause thrush include:   Having an overly moist environment on your skin or nipples that are sore or cracked.   Taking antibiotics, birth control pills or steroids.   Having a diet that contains large amounts of sugar or foods with yeast.   Having a chronic illness like HIV infection, diabetes, or anemia.  If you have sore nipples that last more than a few days even after you make sure your baby's latch and positioning is correct, or you suddenly get sore nipples after several weeks of unpainful nursing, you could have thrush. Some other signs of thrush include:   Pink, flaky, shiny, itchy or cracked nipples.   Deep pink and blistered nipples.   You also could have shooting pains deep in the breast during or after feedings, or achy breasts.  The infection also can form in your baby's mouth from having contact with your nipples, and appear as little Chaviano spots on the inside of the cheeks, gums, or tongue. It also can appear as a diaper rash (small red dots around a rash) on your baby that will not go away by using regular diaper rash ointments. Many babies with thrush refuse to nurse, or are gassy or cranky.  Solution:   If you or your baby have any of these symptoms, contact your doctor and your baby's doctor so you both can be correctly diagnosed.   You can get medication for your nipples and for your baby. Medication for a mother is usually an ointment for the nipples. Your baby can be given a liquid medication for his/her mouth, and/or an ointment for any diaper rash.   Change disposable nursing pads often. Wash any towels or clothing that come in contact with the yeast in very hot water (above 122 F or  50 C).   Wear a clean bra every day. Wash your hands often, and wash your baby's hands often, especially if he or she sucks on his/her fingers.   Only use cotton pads.   Boil any pacifiers, bottle nipples, or toys your baby puts in his or her mouth once a day for 20 minutes to kill the thrush. After one week of treatment, discard pacifiers and nipples and buy new   ones.   Boil daily for 20 minutes all breast pump parts that touch the milk.   Make sure other family members are free of thrush or other fungal infections. If they have symptoms, get them treatment.  NURSING STRIKE A nursing strike is when your baby has been nursing well for months, then suddenly loses interest in breastfeeding and begins to refuse the breast. A nursing strike can mean several things are happening with your baby and that she or he is trying to communicate with you to let you know that something is wrong. Not all babies will react the same to different situations that can cause a nursing strike. Some will continue to breastfeed without a problem, others may just become fussy at the breast, and others will refuse the breast entirely. Some of the major causes of a nursing strike include:  Mouth pain from teething, a fungal infection, or a cold sore.   An ear infection.   Pain from a certain nursing position.   Being upset about a long separation from the mother or a major change in routine.   Being interested in other things around him or her.   A cold or stuffy nose that makes breathing difficult.   Reduced milk supply from supplementing with bottles or overuse of a pacifier.   Responding to the mother's strong reaction if the baby has bitten her.   Being upset about hearing arguing or people talking in a harsh voice with other family members while nursing.   Reacting to stress, over-stimulation, or having been repeatedly put off when wanting to nurse.   If your baby is on a nursing strike, it is normal to  feel frustrated and upset, especially if your baby is unhappy. It is important not to feel guilty or that you have done something wrong. Your breasts also may become uncomfortable as the milk builds up.  Treatment  Try to express your milk manually or with a breast pump on the same schedule as the baby used to breastfeed to avoid engorgement and plugged ducts.   Try another feeding method temporarily to give your baby your milk, such as a cup, dropper, or spoon. Keep track of your baby's wet diapers to make sure he/she is getting enough milk (5 to 6 per day).   Keep offering your breast to the baby. If the baby is frustrated, stop and try again later. Try when the baby is sleeping or very sleepy.   Try various breastfeeding positions.   Focus on the baby with all of your attention and comfort him or her with extra touching and cuddling.   Try nursing while rocking and in a quiet room free of distractions.  INVERTED OR LARGE NIPPLES Some women have nipples that naturally are inverted, or that turn inward instead of protruding, or that are flat and do not protrude. Inverted or flat nipples can sometimes make it harder to breastfeed because your baby can have a harder time latching on. But remember that for breastfeeding to work, your baby has to latch on to both the nipple and the breast, so even inverted nipples can work just fine. Very large nipples can make it hard for the baby to get enough of the areola into his or her mouth to compress the milk ducts and get enough milk.  Know what type of nipples you have before you have your baby, so you can be prepared in case you have a problem getting your baby to latch on   correctly.   Talk with a lactation consultant at the hospital or at a breastfeeding clinic for extra help if you have flat, inverted, or very large nipples.   Sometimes a lactation consultant can help inverted nipples to be pulled out with a small device before your baby is brought to  your breast.   In many cases, inverted nipples will protrude more as the baby starts to latch on and as time passes. The baby's sucking will help.   Flat nipples cause fewer problems than inverted nipples. Good latch-on and positioning are usually enough to ensure that a baby latched to a flat nipple breastfeeds well.   The latch for babies of mothers with very large nipples will improve with time as the baby grows. In some cases, it might take several weeks to get the baby to latch well. A good milk supply helps insure that her baby will get enough milk.  RETURNING TO WORK More and more women are breastfeeding when they return to work because they believe in the benefits of breastfeeding. You can purchase or rent effective breast pumps and storage containers for your milk. Many employers are willing to set up special rooms for mothers who pump. But others are not as educated about the benefits of breastfeeding. Also, many women are not able to take off as much time as they'd like after having their babies and might have to return to work before breastfeeding is well established. After you have your baby, take as much time off as possible. This will help you get your breastfeeding established well and may reduce the number of months you may need to pump your milk while you are at work.   If you plan to have your baby take a bottle of expressed breast milk while you are at work, you can introduce your baby to a bottle when he or she is around four weeks old. Otherwise, the baby might not accept the bottle later on. Once your baby is comfortable taking a bottle, it is a good idea to have Dad or another family member offer a bottle of pumped breast milk on a regular basis so the baby stays in practice.   Let your employer or human resources manager know that you plan to continue breastfeeding once you return to work. Before you return to work, or even before you have your baby, start talking with your  employer about breastfeeding. Do not be afraid to request a clean and private area where you can pump your milk. If you do not have your own office space, you can ask to use a supervisor's office during certain times. Or you can ask to have a clean, clutter free corner of a storage room. All you need is a chair, a small table, and an outlet if you are using an electric pump. Many electric pumps also can run on batteries and do not require an outlet. You can lock the door and place a small sign on it that asks for some privacy. You can pump your breast milk during lunch or other breaks. You could suggest to your employer that you are willing to make up work time for time spent pumping milk.   After pumping, you can refrigerate your milk, place it in a cooler, or freeze it for the baby to be fed later. Many breast pumps come with carrying cases that have a section to store your milk with ice packs. If you do not have access to a refrigerator,   you can leave it at room temperatures for up to 6 hours.   Many employers are not aware of state laws that state they have to allow you to breastfeed at your job. Under these laws, your employer is required to set up a space for you to breastfeed and/or allow paid/unpaid time for breastfeeding employees. To see if your state has a breastfeeding law for employers, go to http://www.llli.org/Law/LawBills.html or call us at 1-800-LALECHE (in [place]).  JAUNDICE  Jaundice is a condition that is common in many newborns. It appears as a yellowing of the skin and eyes. It is caused by an excess of bilirubin, a yellow pigment that is a product in the blood. All babies are born with extra red blood cells that undergo a process of being broken down and eliminated from the body. Bilirubin levels in the blood can be high because of:  Higher production of it in a newborn.   Increased ability of the newborn intestine to absorb it.   Limited ability of the newborn liver to handle large  amounts of it.  Many cases of jaundice do not need to be treated. Your baby's doctor will carefully monitor your baby's bilirubin levels. Sometimes infants have to be temporarily separated from their mothers to receive special treatment with phototherapy (aiming lights on the baby). In these cases, breastfeeding is encouraged but supplements may also be given to the baby. American Academy of Pediatrics advises against stopping breastfeeding in jaundiced babies and suggests continuing frequent breastfeeding, even during treatment. If your baby is jaundiced or develops jaundice, it is important to discuss with your baby's caregiver all possible treatment options. Share that you do not want to interrupt nursing if this is at all possible.  REFLUX  It is not unusual for babies to spit up after nursing. Usually, babies can spit up and show no other signs of illness. The spitting up disappears as the baby's digestive system matures. As long as the baby has 6 to 8 wet diapers and at least 2 bowel movements in a 24 hour period (under 6 weeks of age), and your baby is gaining weight (at least 4 ounces a week) you can be assured your baby is getting enough milk.  However, some babies have a condition called gastroesophageal reflux (GER). This happens when the muscle at the opening of the stomach opens at the wrong times, allowing milk and food to come back up into the the tube in the throat (esophagus). Symptoms of GER can include:   Crying as if in discomfort.   Waking up frequently at night.   Problems swallowing.   Frequent red or sore throat.   Signs of asthma, bronchitis, wheezing or problems breathing.   Projectile vomiting.   Arching of the back as if in severe pain.   Slow weight gain.   Gagging or choking.   Frequent hiccupping or burping.   Severe spitting up, or spitting up after every feeding, or hours after eating.   Refusal to eat.  Many healthy babies might have some of these  symptoms and do not have GER. But there are babies who might only have a few of these symptoms and have a severe case of GER. Not all babies with GER spit up or vomit.  Some babies with GER do not have a serious medical problem. But caring for them can be hard since they tend to be very fussy and wake up frequently at night. More severe cases of GER may need   to be treated with medication if the baby, in addition to spitting up, also refuses to nurse, gains weight poorly or is losing weight, or has periods of gagging or choking.   If your baby spits up after every feeding and has any of the other symptoms mentioned above, it is best to see his or her doctor for a correct diagnosis. Other than GER, your baby could have another condition that needs treatment. If there are no other signs of illness, he/she could just be sensitive to a food in your diet or a medication he/she is receiving. If your baby has GER, it is important to try to continue to breastfeed since breast milk still is more easily digested than formula. Try smaller, more frequent feedings, thorough burping, and putting the baby in an upright position during and after feedings.  CLEFT PALATE AND CLEFT LIP  Cleft palate and cleft lip are some of the most common birth defects that happen as a baby is developing in the womb. A cleft, or opening, in either the palate or lip can happen together or separately and both can be corrected through surgery. Both conditions can prevent babies from breastfeeding because a baby cannot form a good seal around the nipple and areola with his or her mouth, or get milk out of the breast well.   Cleft palate can happen on one or both sides of a baby's mouth and be partial or complete. Right after birth, a mother whose baby has a cleft palate can try to breastfeed her baby, and she can start expressing her milk right away to keep up her supply. Even if her baby cannot latch on well to her breast, the baby can be fed  breast milk by cup. In some hospitals, babies with cleft palate are fitted with a mouthpiece called an obturator. This fits into the cleft and seals it for easier feeding. The baby should be able to exclusively breastfeed after surgery.   Cleft lip can happen on one or both sides of a baby's lip. But a mother can try different breastfeeding positions and use her thumb or breast to help fill in the opening left by the lip to form a seal around the breast. With cleft lip repair, breastfeeding may only have to be stopped for a few hours.   If your baby is born with a cleft palate or cleft lip, talk with a lactation consultant in the hospital for assistance as soon as possible. Human milk and early breastfeeding is still best for your baby's health.  TWINS OR MULTIPLES Mothers of twins or multiples might feel overwhelmed with the idea of breastfeeding more than one baby at a time. The benefits of human milk to both these mothers and babies are the same as for all mothers and babies. When breastfeeding twins, your breast milk will increase to the amount the babies will need. You will have to take in more calories and liquids when nursing twins. If the babies are premature and unable to nurse, you can pump your breasts and freeze the milk until the babies are ready to nurse. But mothers of multiples get even more benefits from breastfeeding:  Their uterus contracts, which is helpful because it has stretched even more to hold more than one baby.   Hormones are released that relax the mother, which is helpful with the added stress of caring for more than one baby.   Eight to ten hours per week are saved because there is no   need to prepare formula or bottles and the mother's milk is available right away.   Breastfeeding early and often for a mother of multiples is important to keep up her milk supply. A good latch-on for each baby is important to avoid sore nipples. Many mothers find that it is easier to nurse  the babies together rather than separately, and that it gets easier as the babies get older. There are many breastfeeding holds that make it easier to nurse more than one baby at a time. If you are having multiples, talk with a lactation consultant about more ways you can successfully breastfeed your babies.  BREASTFEEDING DURING PREGNANCY While most mothers who are nursing a toddler stop breastfeeding if they find out they are pregnant, it is an individual choice to decide whether to keep breastfeeding during the pregnancy. It is not unsafe for the unborn child if you continue to breastfeed an older child during this time. But, if you are having some problems in your pregnancy such as uterine pain or bleeding, a history of preterm labor or problems gaining weight during pregnancy, your doctor may advise you to wean. Your child also may decide to wean on his or her own because pregnancy changes the amount and flavor of your milk. Some women also choose to wean at this time because they have nipple soreness caused by pregnancy hormones, are nauseous, tire more easily, or find that their growing stomachs make breastfeeding uncomfortable. You will need more calories when you breastfeed while pregnant. Your milk production usually slows down around the fourth month of pregnancy.  BREASTFEEDING AFTER BREAST SURGERY   If you have had breast surgery, including breast implants, you might be worried about whether you will be able to breastfeed. The most important things that affect whether you can produce enough milk for your baby are how your surgery was done and where your incisions are, and the reasons for your surgery. For example, women who have had incisions in the fold under the breasts are less likely to have problems producing milk than women who have had incisions around or across the areola. Incisions around the areola can cut into milk ducts and nerves, where milk is produced and travels. Women who have had  breast surgery to augment breasts that never fully developed may not have enough glands to produce a full milk supply.   If you had breast surgery and are worried about how it will affect breastfeeding, talk with a lactation consultant. If you are planning breast surgery and are worried about how it will affect breastfeeding, talk with your surgeon about ways he or she can preserve as much of the breast tissue and milk ducts as possible.  INDUCING LACTATION  Many mothers who adopt want to breastfeed their babies and can do it successfully with some help. It is successful ? to  of the time it is tried. Many will need to supplement their breast milk with donated breast milk or infant formula. But some adoptive mothers can breastfeed exclusively, especially if they have been pregnant before. Lactation is a hormonal response to a physical action. So the stimulation of the baby nursing causes the body to see a need for and produce milk. The more the baby nurses, the more a woman's body will produce milk.   Beginning a combination of hormone treatment several months before the baby is born should help facilitate lactation in many cases.   One thing you can do to prepare is to pump   every 3 hours around the clock for two to three weeks before your baby arrives. Or you can wait until the baby arrives and starts to nurse. Breast milk can be frozen until you are ready to nurse the baby. A supplemental nursing system (SNS) or a lactation aid can help ensure that your baby gets enough nutrition and that your breasts are stimulated to produce milk at the same time.   If you are an adoptive mother who wants to breastfeed, you should see both a lactation consultant and your doctor for help in establishing an initial milk supply.  FOR MORE INFORMATION [employer]: www.llli.org Document Released: 07/27/2005 Document Revised: 01/22/2011 Document Reviewed: 10/19/2007 ExitCare Patient Information 2012  [employer]. 

## 2011-04-19 NOTE — Progress Notes (Signed)
Post Partum Day 2 Subjective: no complaints, up ad lib, voiding, tolerating PO, + flatus and BM since delivery.  Breastfeeding established and going well per pt.  Undecided on BC--didn't use any for "7 yrs" until got pregnant this time.  VB lightening.  S.o. lying on her sleeping and newborn in bassinet by window.  Objective: Blood pressure 100/60, pulse 70, temperature 97.8 F (36.6 C), temperature source Oral, resp. rate 18, height 5\' 11"  (1.803 m), weight 163.295 kg (360 lb), last menstrual period 07/10/2010, SpO2 98.00%, unknown if currently breastfeeding.  Physical Exam:  General: alert, cooperative, no distress and morbidly obese Lochia: appropriate Uterine Fundus: firm, below umbilicus Incision: n/a DVT Evaluation: No evidence of DVT seen on physical exam. Negative Homan's sign. No significant calf/ankle edema.   Basename 04/18/11 0500 04/16/11 2120  HGB 9.6* 11.4*  HCT 29.8* 34.8*    Assessment/Plan: Discharge home and Breastfeeding Mild PP anemia--will continue Fe supp.  Disc'd measures if constipation arises.  Plans to practice abstinence until decides [facility], but is considering LT method.  Continue HCTZ x1 week after d/c home today.  Will have Smart Start BP check this week. Rx for Ferralet 90, Motrin, HCTZ, and continue OTC PNV.  F/u 6 weeks at [facility] or prn.   LOS: 3 days   Lio Wehrly H 04/19/2011, 9:26 AM

## 2011-05-29 DIAGNOSIS — F410 Panic disorder [episodic paroxysmal anxiety] without agoraphobia: Secondary | ICD-10-CM | POA: Insufficient documentation

## 2011-05-29 DIAGNOSIS — Z6835 Body mass index (BMI) 35.0-35.9, adult: Secondary | ICD-10-CM | POA: Insufficient documentation

## 2011-05-29 DIAGNOSIS — Z6841 Body Mass Index (BMI) 40.0 and over, adult: Secondary | ICD-10-CM | POA: Insufficient documentation

## 2011-06-01 ENCOUNTER — Ambulatory Visit: Payer: Medicaid Other | Admitting: Obstetrics and Gynecology

## 2011-06-04 ENCOUNTER — Ambulatory Visit (INDEPENDENT_AMBULATORY_CARE_PROVIDER_SITE_OTHER): Payer: Medicaid Other | Admitting: Obstetrics and Gynecology

## 2011-06-04 ENCOUNTER — Encounter: Payer: Self-pay | Admitting: Obstetrics and Gynecology

## 2011-06-04 DIAGNOSIS — Z309 Encounter for contraceptive management, unspecified: Secondary | ICD-10-CM

## 2011-06-04 NOTE — Progress Notes (Signed)
Tina Melton  is 5 weeks 6 days postpartum following a spontaneous vaginal delivery at 67 gestational weeks Date 04/17/2011 female baby named major delivered by shelly cnm  Breastfeeding: yes Bottlefeeding:  yes  Post-partum blues / depression:  yes  EPDS score: 4  History of abnormal Pap:  no  Last Pap: Date  09/12/2010 wnl. Gestational diabetes:  no  Contraception:  Desires nexplanon  Normal urinary function:  yes Normal GI function:  yes Returning to work:  yes Subjective:     Tina Melton is a 26 y.o. female who presents for a postpartum visit.  I have fully reviewed the prenatal and intrapartum course.    Patient is sexually active.Last intercourse 2-3 DAYS ago  The following portions of the patient's history were reviewed and updated as appropriate: allergies, current medications, past family history, past medical history, past social history, past surgical history and problem list. EPDS4  Review of Systems Pertinent items are noted in HPI.   Objective:    BP 118/74  Ht 5\' 11"  (1.803 m)  Wt 347 lb (157.398 kg)  BMI 48.40 kg/m2  LMP 05/23/2011  Breastfeeding? Yes  General:  alert, cooperative and no distress     Lungs: clear to auscultation bilaterally  Heart:  regular rate and rhythm, S1, S2 normal, no murmur  Abdomen: soft, non-tender; bowel sounds normal; no masses,  no organomegaly   Vulva:  normal  Vagina: normal vagina  Cervix:  normal  Corpus: normal size, contour, position, consistency, mobility, non-tender  Adnexa:  normal adnexa             Assessment:     Normal postpartum exam.  Pap smear not done at today's visit.   Plan:     1. Contraception: Nexplanon 3. Follow up in: for Nexplanon or as needed Explained the need for abstinence between now and the Nexplanon insertion.    HAYGOOD,VANESSA P MD 06/04/2011 6:38 PM

## 2011-06-04 NOTE — Patient Instructions (Signed)
Contraceptive Implant Information A contraceptive implant is a plastic rod that is inserted under the skin. It is usually inserted under the skin of your upper arm. It continually releases small amounts of progestin (synthetic progesterone) into the bloodstream. This prevents an egg from being released from the ovary. It also thickens the cervical mucus to prevent sperm from entering the cervix, and it thins the uterine lining to prevent a fertilized egg from attaching to the uterus. They can be effective for up to 3 years. Implants do not provide protection against sexually transmitted diseases (STDs).  The procedure to insert an implant usually takes about 10 minutes. There may be minor bruising, swelling, and discomfort at the insertion site for a couple days. The implant begins to work within the first day. Other contraceptive protection should be used for 2 weeks. Follow up with your caregiver to get rechecked as directed. Your caregiver will make sure you are a good candidate for the contraceptive implant. Discuss with your caregiver the possible side effects of the implant ADVANTAGES  It prevents pregnancy for up to 3 years.   It is easily reversible.   It is convenient.   The progestins may protect against uterine and ovarian cancer.   It can be used when breastfeeding.   It can be used by women who cannot take estrogen.  DISADVANTAGES  You may have irregular or unplanned vaginal bleeding.   You may develop side effects, including headache, weight gain, acne, breast tenderness, or mood changes.   You may have tissue or nerve damage after insertion (rare).   It may be difficult and uncomfortable to remove.   Certain medications may interfere with the effectiveness of the implants.  REMOVAL OF IMPLANT The implant should be removed in 3 years or as directed by your caregiver. The implants effect wears off in a few hours after removal. Your ability to get pregnant (fertility) is  restored within a couple of weeks. New implants can be inserted as soon as the old ones are removed if desired. DO NOT GET THE IMPLANT IF:   You are pregnant.   You have a history of breast cancer, osteoporosis, blood clots, heart disease, diabetes, high blood pressure, liver disease, tumors, or stroke.    You have undiagnosed vaginal bleeding.   You have overly sensitive to certain parts of the implant.  Document Released: 01/22/2011 Document Reviewed: 01/20/2011 ExitCare Patient Information 2012 [employer]. 

## 2011-06-18 NOTE — Discharge Summary (Signed)
Obstetric Discharge Summary Reason for Admission: induction of labor and delivery due to unexplained proteinuria Prenatal Procedures: NST and ultrasound Intrapartum Procedures: spontaneous vaginal delivery Postpartum Procedures: none Complications-Operative and Postpartum: none Hemoglobin  Date Value Range Status  04/18/2011 9.6* 12.0-15.0 (g/dL) Final     HCT  Date Value Range Status  04/18/2011 29.8* 36.0-46.0 (%) Final    Physical Exam:  General: alert and cooperative Lochia: appropriate Uterine Fundus: firm Incision: NA DVT Evaluation: Calf/Ankle edema is present.  Discharge Diagnoses: Term Pregnancy-delivered Morbid obesity Anemia Proteinuria of unexplained etiology  Discharge Information: Date: 06/18/2011 Activity: unrestricted Diet: routine Medications: Ibuprofen and hydrochlorothiazide Condition: improved Instructions: refer to practice specific booklet and the nurse will evaluate the patient in 1 week Discharge to: home Follow-up Information    Follow up with [facility]. Schedule an appointment as soon as possible for a visit in 6 weeks. (or call as needed for any questions or concerns)          Newborn Data: Live born female  Birth Weight: 6 lb 10.3 oz (3014 g) APGAR: 9, 9 The parents plan an outpatient circumcision  Home with mother.  Paiten Boies V 06/18/2011, 11:40 AM

## 2012-05-18 ENCOUNTER — Encounter (HOSPITAL_COMMUNITY): Payer: Self-pay | Admitting: Emergency Medicine

## 2012-05-18 ENCOUNTER — Emergency Department (HOSPITAL_COMMUNITY)
Admission: EM | Admit: 2012-05-18 | Discharge: 2012-05-18 | Disposition: A | Discharge status: Home / Self Care | Payer: Self-pay | Attending: Emergency Medicine | Admitting: Emergency Medicine

## 2012-05-18 ENCOUNTER — Emergency Department (HOSPITAL_COMMUNITY): Payer: Self-pay

## 2012-05-18 DIAGNOSIS — Z8659 Personal history of other mental and behavioral disorders: Secondary | ICD-10-CM | POA: Insufficient documentation

## 2012-05-18 DIAGNOSIS — Z8619 Personal history of other infectious and parasitic diseases: Secondary | ICD-10-CM | POA: Insufficient documentation

## 2012-05-18 DIAGNOSIS — Z87898 Personal history of other specified conditions: Secondary | ICD-10-CM | POA: Insufficient documentation

## 2012-05-18 DIAGNOSIS — Z8739 Personal history of other diseases of the musculoskeletal system and connective tissue: Secondary | ICD-10-CM | POA: Insufficient documentation

## 2012-05-18 DIAGNOSIS — Z8768 Personal history of other (corrected) conditions arising in the perinatal period: Secondary | ICD-10-CM | POA: Insufficient documentation

## 2012-05-18 DIAGNOSIS — Z862 Personal history of diseases of the blood and blood-forming organs and certain disorders involving the immune mechanism: Secondary | ICD-10-CM | POA: Insufficient documentation

## 2012-05-18 DIAGNOSIS — S0990XA Unspecified injury of head, initial encounter: Secondary | ICD-10-CM

## 2012-05-18 DIAGNOSIS — E669 Obesity, unspecified: Secondary | ICD-10-CM | POA: Insufficient documentation

## 2012-05-18 HISTORY — DX: Obesity, unspecified: E66.9

## 2012-05-18 MED ORDER — IBUPROFEN 600 MG PO TABS: 600.0000 mg | ORAL_TABLET | Freq: Four times a day (QID) | ORAL | Status: DC | PRN

## 2012-05-18 MED ORDER — OXYCODONE-ACETAMINOPHEN 5-325 MG PO TABS
2.0000 | ORAL_TABLET | Freq: Once | ORAL | Status: AC
  Administered 2012-05-18: 2 via ORAL
  Filled 2012-05-18: qty 2
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
## 2012-05-18 NOTE — ED Provider Notes (Signed)
I saw and evaluated the patient, reviewed the resident's note and I agree with the findings and plan.  Patient seen examined. No focal neurological deficits. X-rays negative. She is stable for discharge  Toy Baker, MD 05/18/12 2257

## 2012-05-19 NOTE — ED Provider Notes (Signed)
I saw and evaluated the patient, reviewed the resident's note and I agree with the findings and plan.  Sammy Douthitt T Loel Betancur, MD 05/19/12 1145 

## 2013-09-22 ENCOUNTER — Ambulatory Visit (INDEPENDENT_AMBULATORY_CARE_PROVIDER_SITE_OTHER): Payer: Self-pay | Admitting: General Surgery

## 2013-10-12 ENCOUNTER — Ambulatory Visit (INDEPENDENT_AMBULATORY_CARE_PROVIDER_SITE_OTHER): Payer: Self-pay | Admitting: General Surgery

## 2013-12-18 ENCOUNTER — Encounter (HOSPITAL_COMMUNITY): Payer: Self-pay | Admitting: Emergency Medicine

## 2014-03-10 IMAGING — CT CT HEAD W/O CM
1 series · 16 of 30 positions shown, 20 images · non-contrast
Comparison: None.

CLINICAL DATA: Head injury to the posterior left skull.  No loss of
consciousness.

CT HEAD WITHOUT CONTRAST
TECHNIQUE: Contiguous axial images were obtained from the base of
the skull through the vertex without contrast.

[Series 2: head trauma 4.8 h37s · axial · 0.44mm/px · z∈[-37,+118]mm · 16 of 36 slices shown, 20 images]
[im 2/36  brain]
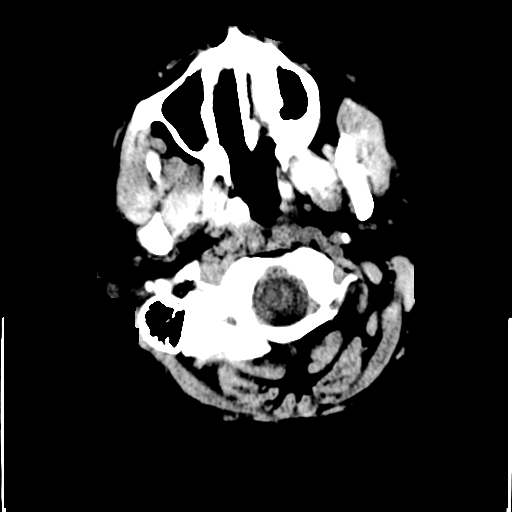
[im 2/36  bone]
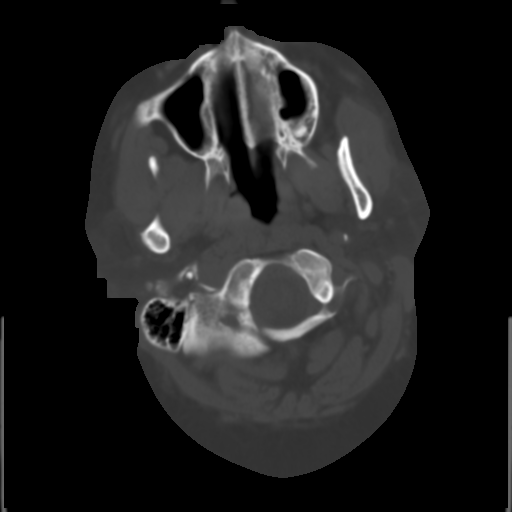
[im 4/36  brain]
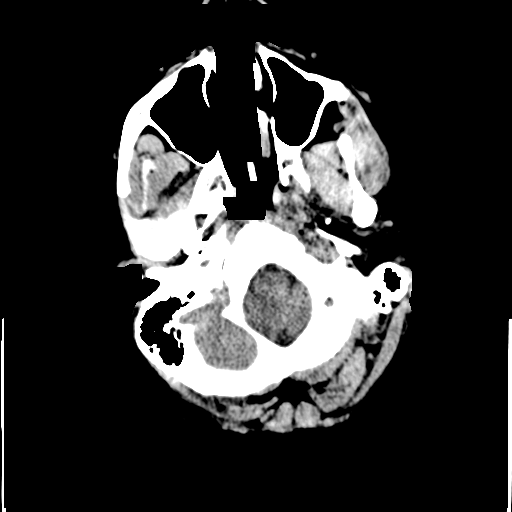
[im 7/36  brain]
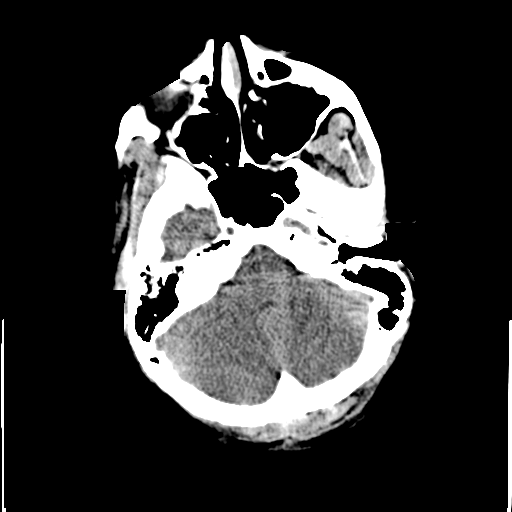
[im 9/36  brain]
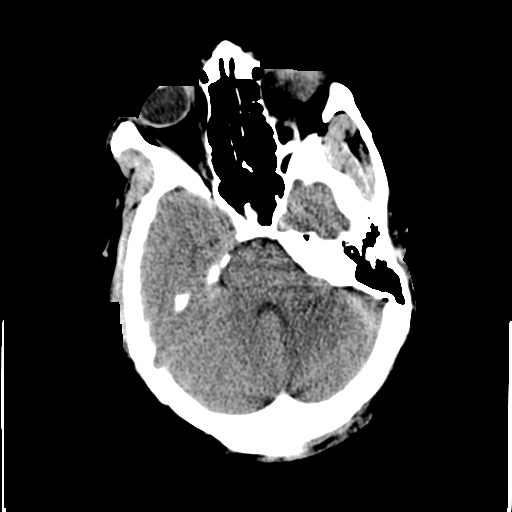
[im 10/36  brain]
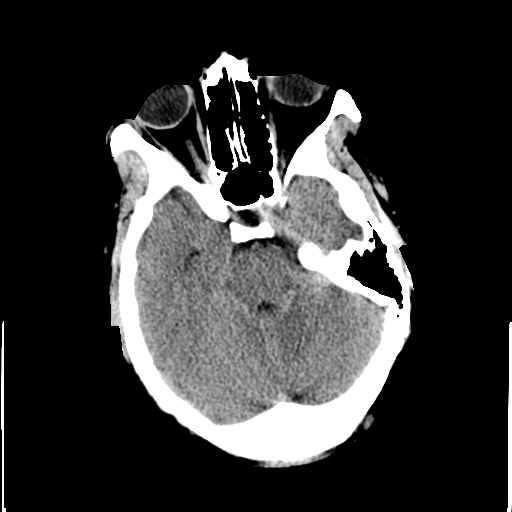
[im 10/36  bone]
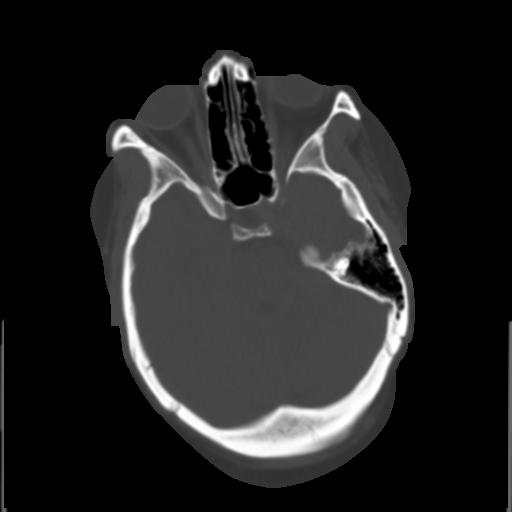
[im 13/36  brain]
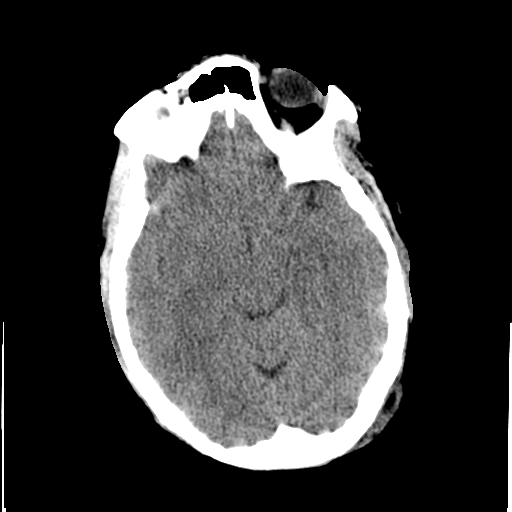
[im 15/36  brain]
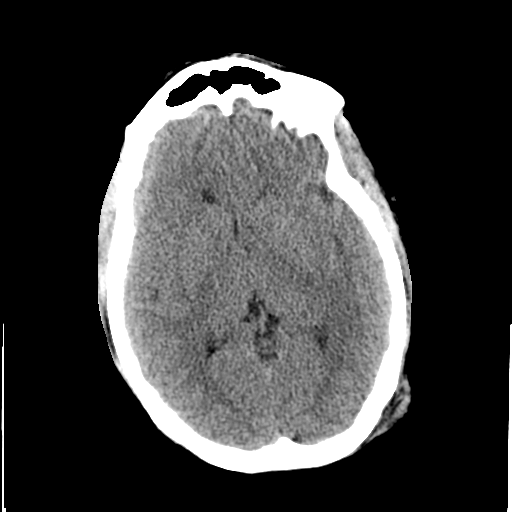
[im 17/36  brain]
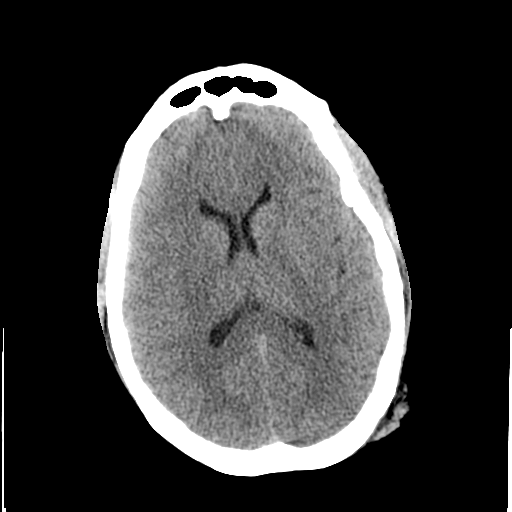
[im 19/36  brain]
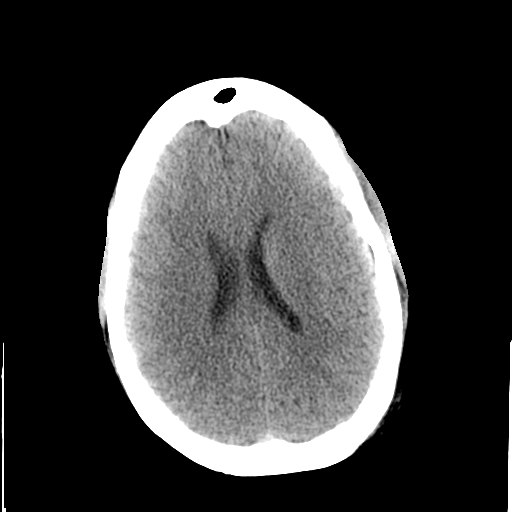
[im 19/36  bone]
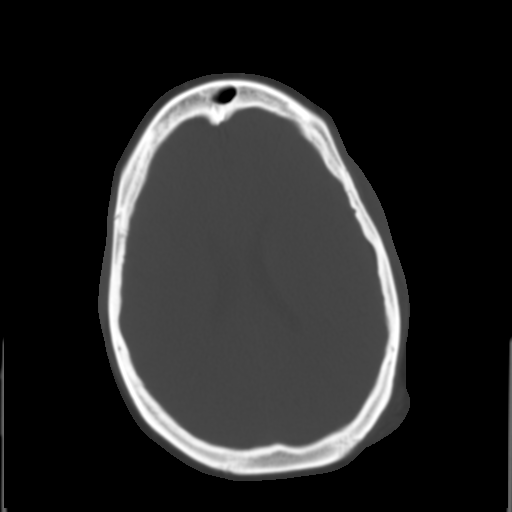
[im 21/36  brain]
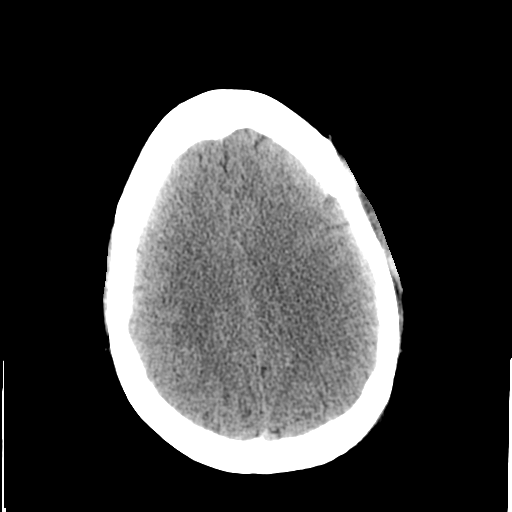
[im 23/36  brain]
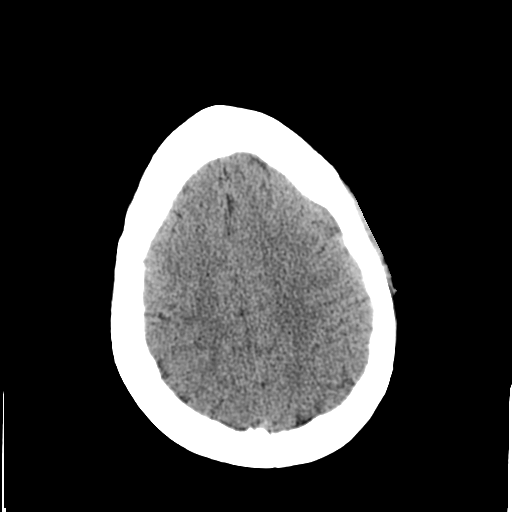
[im 26/36  brain]
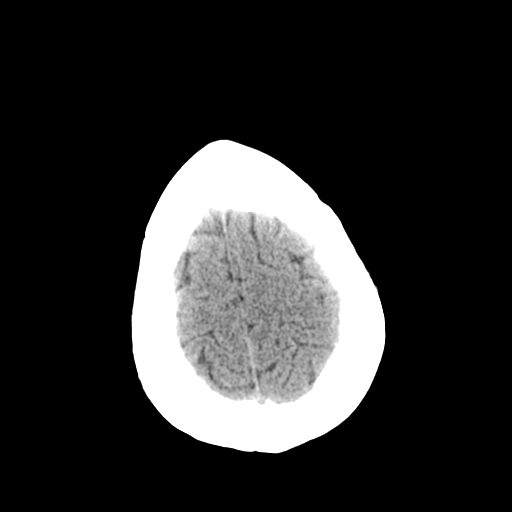
[im 27/36  brain]
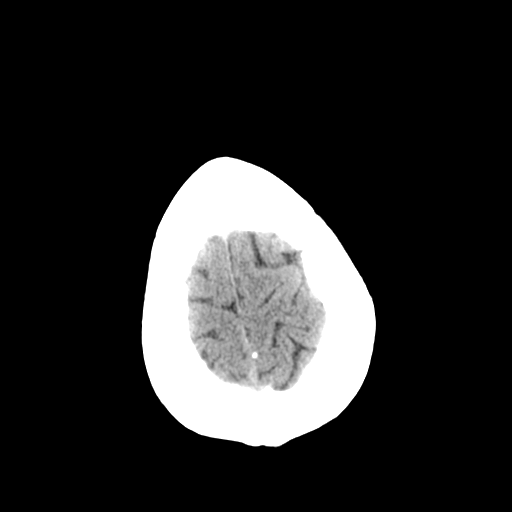
[im 27/36  bone]
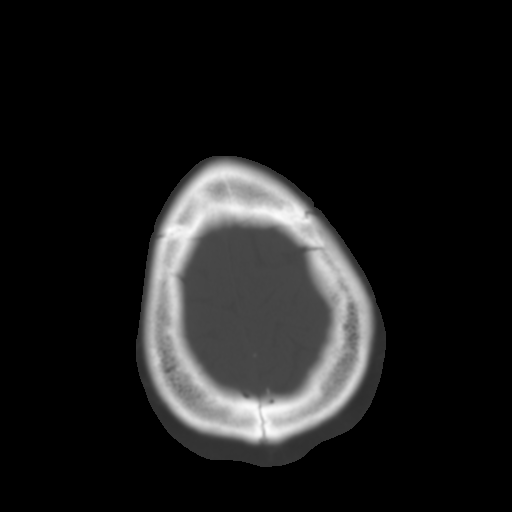
[im 29/36  brain]
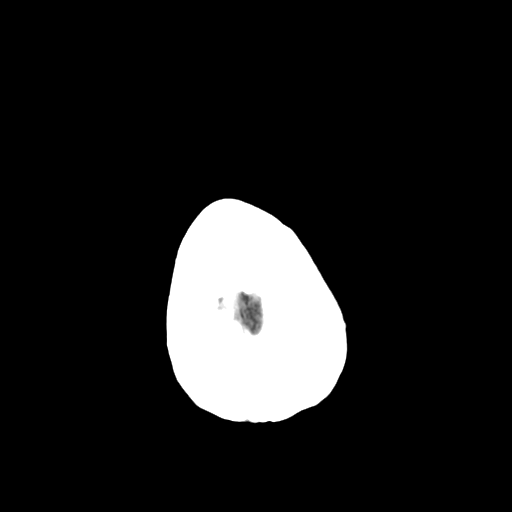
[im 32/36  brain]
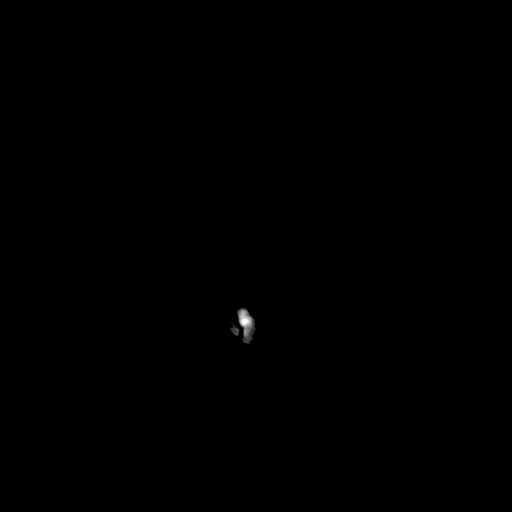
[im 34/36  brain]
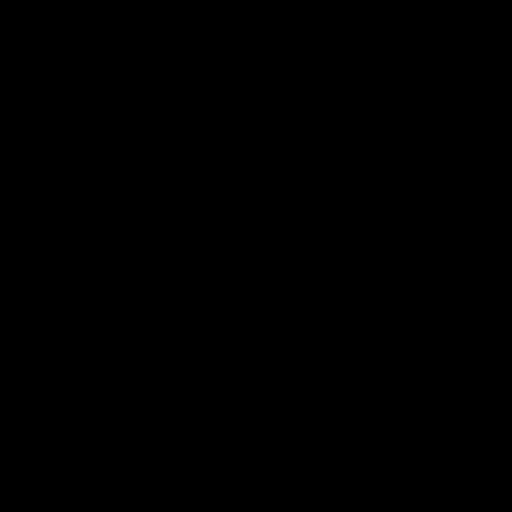

[16 of 30 positions shown; findings below may reference images not displayed]

FINDINGS: The ventricles and sulci are symmetrical without
significant effacement, displacement, or dilatation. No mass effect
or midline shift. No abnormal extra-axial fluid collections. The
grey-white matter junction is distinct. Basal cisterns are not
effaced. No acute intracranial hemorrhage. No depressed skull
fractures.  Subcutaneous scalp hematoma over the left posterior
parietal region.  No underlying skull fractures.  Visualized
paranasal sinuses and mastoid air cells are not opacified.
IMPRESSION: No acute intracranial abnormalities.

## 2015-05-11 ENCOUNTER — Encounter (HOSPITAL_COMMUNITY): Payer: Self-pay | Admitting: Emergency Medicine

## 2015-05-11 ENCOUNTER — Emergency Department (HOSPITAL_COMMUNITY)
Admission: EM | Admit: 2015-05-11 | Discharge: 2015-05-11 | Disposition: A | Discharge status: Home / Self Care | Payer: Self-pay | Attending: Emergency Medicine | Admitting: Emergency Medicine

## 2015-05-11 ENCOUNTER — Emergency Department (HOSPITAL_COMMUNITY): Payer: Self-pay

## 2015-05-11 DIAGNOSIS — K1120 Sialoadenitis, unspecified: Secondary | ICD-10-CM | POA: Insufficient documentation

## 2015-05-11 DIAGNOSIS — Z3202 Encounter for pregnancy test, result negative: Secondary | ICD-10-CM | POA: Insufficient documentation

## 2015-05-11 DIAGNOSIS — K1121 Acute sialoadenitis: Secondary | ICD-10-CM

## 2015-05-11 DIAGNOSIS — Z8619 Personal history of other infectious and parasitic diseases: Secondary | ICD-10-CM | POA: Insufficient documentation

## 2015-05-11 DIAGNOSIS — M1990 Unspecified osteoarthritis, unspecified site: Secondary | ICD-10-CM | POA: Insufficient documentation

## 2015-05-11 DIAGNOSIS — Z862 Personal history of diseases of the blood and blood-forming organs and certain disorders involving the immune mechanism: Secondary | ICD-10-CM | POA: Insufficient documentation

## 2015-05-11 DIAGNOSIS — E669 Obesity, unspecified: Secondary | ICD-10-CM | POA: Insufficient documentation

## 2015-05-11 DIAGNOSIS — Z8659 Personal history of other mental and behavioral disorders: Secondary | ICD-10-CM | POA: Insufficient documentation

## 2015-05-11 LAB — CBC WITH DIFFERENTIAL/PLATELET
BASOS ABS: 0 10*3/uL (ref 0.0–0.1)
BASOS PCT: 0 %
EOS ABS: 0.1 10*3/uL (ref 0.0–0.7)
Eosinophils Relative: 2 %
HEMATOCRIT: 36 % (ref 36.0–46.0)
Hemoglobin: 11.2 g/dL — ABNORMAL LOW (ref 12.0–15.0)
Lymphocytes Relative: 26 %
Lymphs Abs: 1 10*3/uL (ref 0.7–4.0)
MCH: 23.9 pg — ABNORMAL LOW (ref 26.0–34.0)
MCHC: 31.1 g/dL (ref 30.0–36.0)
MCV: 76.9 fL — ABNORMAL LOW (ref 78.0–100.0)
MONO ABS: 0.4 10*3/uL (ref 0.1–1.0)
Monocytes Relative: 11 %
NEUTROS ABS: 2.3 10*3/uL (ref 1.7–7.7)
NEUTROS PCT: 61 %
Platelets: 294 10*3/uL (ref 150–400)
RBC: 4.68 MIL/uL (ref 3.87–5.11)
RDW: 15.2 % (ref 11.5–15.5)
WBC: 3.7 10*3/uL — ABNORMAL LOW (ref 4.0–10.5)

## 2015-05-11 LAB — BASIC METABOLIC PANEL
ANION GAP: 10 (ref 5–15)
BUN: 9 mg/dL (ref 6–20)
CALCIUM: 8.9 mg/dL (ref 8.9–10.3)
CO2: 22 mmol/L (ref 22–32)
CREATININE: 0.7 mg/dL (ref 0.44–1.00)
Chloride: 108 mmol/L (ref 101–111)
GLUCOSE: 100 mg/dL — AB (ref 65–99)
Potassium: 4.2 mmol/L (ref 3.5–5.1)
Sodium: 140 mmol/L (ref 135–145)

## 2015-05-11 LAB — I-STAT BETA HCG BLOOD, ED (MC, WL, AP ONLY)

## 2015-05-11 MED ORDER — IBUPROFEN 800 MG PO TABS: 800.0000 mg | ORAL_TABLET | Freq: Three times a day (TID) | ORAL | Status: DC | PRN

## 2015-05-11 MED ORDER — CLINDAMYCIN HCL 300 MG PO CAPS: 300.0000 mg | ORAL_CAPSULE | Freq: Four times a day (QID) | ORAL | Status: DC

## 2015-05-11 MED ORDER — SODIUM CHLORIDE 0.9 % IV BOLUS (SEPSIS)
500.0000 mL | Freq: Once | INTRAVENOUS | Status: AC
  Administered 2015-05-11: 500 mL via INTRAVENOUS

## 2015-05-11 MED ORDER — ONDANSETRON HCL 4 MG PO TABS: 4.0000 mg | ORAL_TABLET | Freq: Three times a day (TID) | ORAL | Status: DC | PRN

## 2015-05-11 MED ORDER — ONDANSETRON HCL 4 MG/2ML IJ SOLN
4.0000 mg | Freq: Once | INTRAMUSCULAR | Status: AC
  Administered 2015-05-11: 4 mg via INTRAVENOUS
  Filled 2015-05-11: qty 2

## 2015-05-11 MED ORDER — IOPAMIDOL (ISOVUE-300) INJECTION 61%
INTRAVENOUS | Status: AC
  Administered 2015-05-11: 09:00:00
  Filled 2015-05-11: qty 75

## 2015-05-11 MED ORDER — MORPHINE SULFATE (PF) 4 MG/ML IV SOLN
4.0000 mg | Freq: Once | INTRAVENOUS | Status: AC
  Administered 2015-05-11: 4 mg via INTRAVENOUS
  Filled 2015-05-11: qty 1

## 2015-05-11 NOTE — Discharge Instructions (Signed)
Read the information below.  Use the prescribed medication as directed.  Please discuss all new medications with your pharmacist.  You may return to the Emergency Department at any time for worsening condition or any new symptoms that concern you.  If you develop fevers, uncontrolled pain, difficulty swallowing or breathing, return to the ER immediately for a recheck.      Parotitis Parotitis is soreness and inflammation of one or both parotid glands. The parotid glands produce saliva. They are located on each side of the face, below and in front of the earlobes. The saliva produced comes out of tiny openings (ducts) inside the cheeks. In most cases, parotitis goes away over time or with treatment. If your parotitis is caused by certain long-term (chronic) diseases, it may come back again.  CAUSES  Parotitis can be caused by:  Viral infections. Mumps is one viral infection that can cause parotitis.  Bacterial infections.  Blockage of the salivary ducts due to a salivary stone.  Narrowing of the salivary ducts.  Swelling of the salivary ducts.  Dehydration.  Autoimmune conditions, such as sarcoidosis or Sjogren syndrome.  Air from activities such as scuba diving, glass blowing, or playing an instrument (rare).  Human immunodeficiency virus (HIV) or acquired immunodeficiency syndrome (AIDS).  Tuberculosis. SIGNS AND SYMPTOMS   The ears may appear to be pushed up and out from their normal position.  Redness (erythema) of the skin over the parotid glands.  Pain and tenderness over the parotid glands.  Swelling in the parotid gland area.  Yellowish-Melby fluid (pus) coming from the ducts inside the cheeks.  Dry mouth.  Bad taste in the mouth. DIAGNOSIS  Your health care provider may determine that you have parotitis based on your symptoms and a physical exam. A sample of fluid may also be taken from the parotid gland and tested to find the cause of your infection. X-rays or  computed tomography (CT) scans may be taken if your health care provider thinks you might have a salivary stone blocking your salivary duct. TREATMENT  Treatment varies depending upon the cause of your parotitis. If your parotitis is caused by mumps, no treatment is needed. The condition will go away on its own after 7 to 10 days. In other cases, treatment may include:  Antibiotic medicine if your infection was caused by bacteria.  Pain medicines.  Gland massage.  Eating sour candy to increase your saliva production.  Removal of salivary stones. Your health care provider may flush stones out with fluids or remove them with tweezers.  Surgery to remove the parotid glands. HOME CARE INSTRUCTIONS   If you were prescribed an antibiotic medicine, finish it all even if you start to feel better.  Put warm compresses on the sore area.  Take medicines only as directed by your health care provider.  Drink enough fluids to keep your urine clear or pale yellow. SEEK IMMEDIATE MEDICAL CARE IF:   You have increasing pain or swelling that is not controlled with medicine.  You have a fever. MAKE SURE YOU:  Understand these instructions.  Will watch your condition.  Will get help right away if you are not doing well or get worse.   This information is not intended to replace advice given to you by your health care provider. Make sure you discuss any questions you have with your health care provider.   Document Released: 07/25/2001 Document Revised: 02/23/2014 Document Reviewed: 06/28/2014 Elsevier Interactive Patient Education 2016 [place]  Resource Guide Financial Assistance The [employer] 211 is a great source of information about community services available.  Access by dialing 2-1-1 from anywhere in [place], or by website -  CustodianSupply.fi.   Other Local Resources (Updated 02/2015)  [place]    Phone Number and Address    [place] medical care - 1st and 3rd Saturday of every month  Must not qualify for public or private insurance and must have limited income [phone] [address], [place]  Child care  Emergency assistance for housing and [employer]  Medicaid [phone] [address], [place] [address]   [facility] Department  Low-cost medical care for children, communicable diseases, sexually-transmitted diseases, immunizations, maternity care, womens health and family planning [phone] [address] [place], [place] [address]  [facility] Medication Management Clinic   Medication assistance for [facility] residents  Must meet income requirements [phone] [place], [place].    [place]  Child care  Emergency assistance for housing and [employer]  Medicaid [phone] [address] [place], [place] [address]  [facility] and [place]   Low-cost medical care,   Monday through Friday, 9 am to 6 pm.   Accepts Medicare/Medicaid, and self-pay [phone] [address] [place], [place] [address]  [facility] care - Monday through Friday, 8:30 am - 5:30 pm  Accepts Medicaid and self-pay [phone] [address], [place], [place] [address]   [facility]  Primary medical care, including for those with sickle cell disease  Accepts Medicare, Medicaid, insurance and self-pay [phone] [place], [place]  [facility]   Primary medical care  Accepts Medicare, [place], insurance and self-pay [phone] [address], [place], [place] [address]   [facility] Department of Social Services  Child care  Emergency  assistance for housing and [employer]  Medicaid [phone] [address] [place], [place] [address]  [place] Department of Health and [employer]  Child care  Emergency assistance for housing and [employer]  Medicaid [phone] [place], [place] [address]   [facility] Medication Assistance Program  Medication assistance for [facility] residents with no insurance only  Must have a primary care doctor [phone], [place], [place]  [facility]   Primary medical care  [place], [place], insurance  [phone] [address] [place], [place]  [facility]   Medication assistance [phone]  [facility]   Primary medical care  Accepts Medicare, [place], insurance and self-pay [phone] [address], [place] [address]  [facility]   Primary medical care  Accepts Medicare, [place], insurance and self-pay [phone] [address] [place], [place] [address]  [facility]  For [place] residents between the ages of 91 and 29 who do not have any form of health insurance, Medicare, [place], or [place] benefits.  Services are provided free of charge to uninsured patients who fall within federal poverty guidelines.    Hours: Tuesdays and Thursdays, 4:15 - 8 pm [phone] [address], [place], [place] [address]  [facility]     Primary medical care  Dental care  Nutritional counseling  Pharmacy  Accepts Medicaid, Medicare, most insurance.  Fees are adjusted based on ability to pay.   [place], [address], [place],  [place] [facility], [place], [place] [facility] [place], [place]  [facility]  Womens health and family planning [phone] [place]. [place], [place] care  Emergency assistance for housing and [employer]  Medicaid [phone] [address] [place], [place] [address]   [facility]    Ages 2 and older  Hours: Mondays and Thursdays, 7:00 am - 9:00 am Patients are seen on a first come, first served basis. [phone], ext. [place], [place]  Child care  Emergency assistance for housing and [employer]  Medicaid [phone] [address], [place] [address]  [facility]  Medication assistance  Rental assistance  Food pantry  Medication assistance  Housing assistance  Emergency food distribution  Utility assistance [address], [employer]  [address] [place], [place] [address] Hours: Tuesdays and Thursdays from 9am - 12 noon by appointment only  [address] [place], [place] [address]  [facility] [place] private insurance, [place], and [place].  Payment is based on a sliding scale for those without insurance.  Hours: Mondays, Tuesdays and Thursdays, 8:30 am - 5:30 pm.   [phone] [place], [place]  [facility] - Family Medicine at [facility], [place], and [place].  Payment is based on a sliding scale for those without insurance. [phone] [address] [place], [place]  [facility] - Pediatrics at [address], research (medical), [employer], and [place].  Payment is  based on a sliding scale for those without insurance [phone] [address] [place], [employer], [place]  [facility] - Pediatrics at [employer], [place], and [place].  Payment is based on a sliding scale for those without insurance. [phone] [place], [place]  [facility] - Pediatrics at [facility], [place], and [place].  Payment is based on a sliding scale for those without insurance. [phone], ext. [phone] [place], [place].    Miguel Barrera care.  Accepts Medicaid and self-pay. [place], [place]
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
## 2015-05-11 NOTE — ED Notes (Signed)
Last night felt a knot below left ear; this morning large area anterior to left ear and moving into cheek/jaw area is swollen and painful. No redness noted, but is warmer to touch.

## 2018-04-19 ENCOUNTER — Emergency Department (HOSPITAL_COMMUNITY)
Admission: EM | Admit: 2018-04-19 | Discharge: 2018-04-20 | Disposition: A | Discharge status: Home / Self Care | Payer: Self-pay | Attending: Emergency Medicine | Admitting: Emergency Medicine

## 2018-04-19 ENCOUNTER — Emergency Department (HOSPITAL_COMMUNITY): Payer: Self-pay

## 2018-04-19 ENCOUNTER — Encounter (HOSPITAL_COMMUNITY): Payer: Self-pay

## 2018-04-19 ENCOUNTER — Other Ambulatory Visit: Payer: Self-pay

## 2018-04-19 DIAGNOSIS — Z79899 Other long term (current) drug therapy: Secondary | ICD-10-CM | POA: Insufficient documentation

## 2018-04-19 DIAGNOSIS — J101 Influenza due to other identified influenza virus with other respiratory manifestations: Secondary | ICD-10-CM | POA: Insufficient documentation

## 2018-04-19 LAB — COMPREHENSIVE METABOLIC PANEL
ALT: 17 U/L (ref 0–44)
AST: 16 U/L (ref 15–41)
Albumin: 3.8 g/dL (ref 3.5–5.0)
Alkaline Phosphatase: 63 U/L (ref 38–126)
Anion gap: 6 (ref 5–15)
BILIRUBIN TOTAL: 0.7 mg/dL (ref 0.3–1.2)
BUN: 9 mg/dL (ref 6–20)
CALCIUM: 8.3 mg/dL — AB (ref 8.9–10.3)
CO2: 23 mmol/L (ref 22–32)
Chloride: 106 mmol/L (ref 98–111)
Creatinine, Ser: 0.76 mg/dL (ref 0.44–1.00)
GFR calc Af Amer: >60 mL/min (ref >60)
GFR calc non Af Amer: >60 mL/min (ref >60)
Glucose, Bld: 103 mg/dL — ABNORMAL HIGH (ref 70–99)
Potassium: 3.8 mmol/L (ref 3.5–5.1)
Sodium: 135 mmol/L (ref 135–145)
Total Protein: 7.5 g/dL (ref 6.5–8.1)

## 2018-04-19 LAB — CBC
HEMATOCRIT: 36.2 % (ref 36.0–46.0)
HEMOGLOBIN: 11.2 g/dL — AB (ref 12.0–15.0)
MCH: 25.2 pg — ABNORMAL LOW (ref 26.0–34.0)
MCHC: 30.9 g/dL (ref 30.0–36.0)
MCV: 81.5 fL (ref 80.0–100.0)
Platelets: 254 10*3/uL (ref 150–400)
RBC: 4.44 MIL/uL (ref 3.87–5.11)
RDW: 14.4 % (ref 11.5–15.5)
WBC: 4.8 10*3/uL (ref 4.0–10.5)
nRBC: 0 % (ref 0.0–0.2)

## 2018-04-19 LAB — URINALYSIS, ROUTINE W REFLEX MICROSCOPIC
Bilirubin Urine: NEGATIVE
Glucose, UA: NEGATIVE mg/dL
HGB URINE DIPSTICK: NEGATIVE
Ketones, ur: NEGATIVE mg/dL
Leukocytes,Ua: NEGATIVE
Nitrite: NEGATIVE
PH: 7 (ref 5.0–8.0)
Protein, ur: NEGATIVE mg/dL
Specific Gravity, Urine: 1.019 (ref 1.005–1.030)

## 2018-04-19 LAB — PREGNANCY, URINE: Preg Test, Ur: NEGATIVE

## 2018-04-19 LAB — INFLUENZA PANEL BY PCR (TYPE A & B)
Influenza A By PCR: NEGATIVE
Influenza B By PCR: POSITIVE — AB

## 2018-04-19 MED ORDER — ONDANSETRON HCL 4 MG/2ML IJ SOLN
4.0000 mg | Freq: Once | INTRAMUSCULAR | Status: DC
  Filled 2018-04-19: qty 2

## 2018-04-19 MED ORDER — METOCLOPRAMIDE HCL 5 MG/ML IJ SOLN
10.0000 mg | Freq: Once | INTRAMUSCULAR | Status: AC
  Administered 2018-04-19: 10 mg via INTRAVENOUS
  Filled 2018-04-19: qty 2

## 2018-04-19 MED ORDER — KETOROLAC TROMETHAMINE 30 MG/ML IJ SOLN
15.0000 mg | Freq: Once | INTRAMUSCULAR | Status: AC
  Administered 2018-04-19: 15 mg via INTRAVENOUS
  Filled 2018-04-19: qty 1

## 2018-04-19 MED ORDER — ACETAMINOPHEN 500 MG PO TABS
1000.0000 mg | ORAL_TABLET | Freq: Once | ORAL | Status: AC
  Administered 2018-04-19: 1000 mg via ORAL
  Filled 2018-04-19: qty 2

## 2018-04-19 MED ORDER — SODIUM CHLORIDE 0.9 % IV BOLUS
2000.0000 mL | Freq: Once | INTRAVENOUS | Status: AC
  Administered 2018-04-19: 2000 mL via INTRAVENOUS

## 2018-04-19 NOTE — ED Notes (Signed)
Bed: XA15 Expected date:  Expected time:  Means of arrival:  Comments: 33 yr old URI

## 2018-04-19 NOTE — ED Triage Notes (Signed)
Per EMS, Pt has had flu like symptoms for since Sunday. Pts son has also had flu like symptoms. Called EMS due to symptoms getting worse (fever, chills, H/A, body ache, H/A, N/V)

## 2018-04-19 NOTE — ED Provider Notes (Signed)
[facility] DEPT Provider Note   CSN: [id number] Arrival date & time: 04/19/18  2134    History   Chief Complaint Chief Complaint  Patient presents with  . Influenza    HPI Tina Melton is a 33 y.o. female.     33 year old female with a history of anemia, arthritis, panic attacks presents to the emergency department for flulike illness x2 days.  She states that her son began having similar flulike symptoms on Friday; is still unwell.  She has been experiencing a fairly fairly constant, throbbing, frontal headache.  It has been unrelieved with Tylenol.  Symptoms have also included sore throat, cough, myalgias, vomiting and diarrhea.  She has been using DayQuil and NyQuil for management of her associated symptoms without relief.  No neck stiffness, syncope, melena, hematochezia.  No hx of abdominal surgeries.  The history is provided by the patient. No language interpreter was used.  Influenza    Past Medical History:  Diagnosis Date  . Anemia   . Arthritis   . History of chicken pox   . History of chlamydia   . History of hemorrhage specific to perinatal period    6-7 mos bleeding x2, heavy bldg with 2nd preg was told baby nicked 2 blood vessels  . Obesity   . Panic attacks   . Pregnancy induced hypertension   . Yeast infection     Patient Active Problem List   Diagnosis Date Noted  . Panic attacks 05/29/2011  . BMI 50.0-59.9, adult ([place]) 05/29/2011  . Lactating mother 04/19/2011  . Obesity 04/17/2011  . 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 Proteinuria 04/17/2011  . NSVD (normal spontaneous vaginal delivery) 04/17/2011    Past Surgical History:  Procedure Laterality Date  . INDUCED ABORTION       OB History    Gravida  4   Para  2   Term  2   Preterm  0   AB  2   Living  2     SAB  1   TAB  1   Ectopic  0   Multiple  0   Live Births  2            Home Medications    Prior to Admission medications   Medication  Sig Start Date End Date Taking? Authorizing Provider  acetaminophen (TYLENOL) 500 MG tablet Take 1,000 mg by mouth every 6 (six) hours as needed for mild pain or headache.   Yes [provider]  Phenyleph-Doxylamine-DM-APAP (NYQUIL SEVERE COLD/FLU) 5-6.25-10-325 MG/15ML LIQD Take 15 mLs by mouth at bedtime as needed (flu symptoms).   Yes [provider]  Pseudoephedrine-APAP-DM (DAYQUIL MULTI-SYMPTOM COLD/FLU PO) Take 15 mLs by mouth every 6 (six) hours as needed (flu symptoms).   Yes [provider]  benzonatate (TESSALON) 100 MG capsule Take 1 capsule (100 mg total) by mouth 3 (three) times daily as needed for cough. 04/20/18   Antonietta Breach, PA-C  ibuprofen (ADVIL,MOTRIN) 600 MG tablet Take 1 tablet (600 mg total) by mouth every 6 (six) hours as needed for fever, headache, mild pain or moderate pain. 04/20/18   Antonietta Breach, PA-C  ondansetron (ZOFRAN) 4 MG tablet Take 1 tablet (4 mg total) by mouth every 8 (eight) hours as needed for nausea or vomiting. 04/20/18   Antonietta Breach, PA-C    Family History Family History  Problem Relation Age of Onset  . Mental illness Mother        schizophrenia and  panic attacks  . Rheum arthritis Father   . Arthritis Father   . Birth defects Son        extra digit  . Hypertension Maternal Grandmother   . Stroke Maternal Grandmother   . Dementia Maternal Grandmother   . Diabetes Maternal Grandfather   . Autoimmune disease Paternal Grandmother   . Arthritis Paternal Grandmother   . Diabetes Paternal Grandmother     Social History Social History   Tobacco Use  . Smoking status: Never Smoker  . Smokeless tobacco: Never Used  Substance Use Topics  . Alcohol use: No  . Drug use: No     Allergies   Lemon juice   Review of Systems Review of Systems Ten systems reviewed and are negative for acute change, except as noted in the HPI.    Physical Exam Updated Vital Signs BP 123/82 (BP Location: Right Arm)   Pulse 82   Temp  98.1 F (36.7 C)   Resp 20   Ht 5\' 11"  (1.803 m)   Wt (!) 152.4 kg   SpO2 99%   BMI 46.86 kg/m   Physical Exam Vitals signs and nursing note reviewed.  Constitutional:      General: She is not in acute distress.    Appearance: She is well-developed. She is not diaphoretic.     Comments: Obese, nontoxic appearing female.  HENT:     Head: Normocephalic and atraumatic.  Eyes:     General: No scleral icterus.    Conjunctiva/sclera: Conjunctivae normal.  Neck:     Musculoskeletal: Normal range of motion.  Cardiovascular:     Rate and Rhythm: Regular rhythm. Tachycardia present.     Pulses: Normal pulses.  Pulmonary:     Effort: Pulmonary effort is normal. No respiratory distress.     Breath sounds: No stridor. No wheezing, rhonchi or rales.     Comments: Lungs CTAB. Respirations even and unlabored. Musculoskeletal: Normal range of motion.  Skin:    General: Skin is warm and dry.     Coloration: Skin is not pale.     Findings: No erythema or rash.  Neurological:     Mental Status: She is alert and oriented to person, place, and time.  Psychiatric:        Behavior: Behavior normal.      ED Treatments / Results  Labs (all labs ordered are listed, but only abnormal results are displayed) Labs Reviewed  CBC - Abnormal; Notable for the following components:      Result Value   Hemoglobin 11.2 (*)    MCH 25.2 (*)    All other components within normal limits  COMPREHENSIVE METABOLIC PANEL - Abnormal; Notable for the following components:   Glucose, Bld 103 (*)    Calcium 8.3 (*)    All other components within normal limits  INFLUENZA PANEL BY PCR (TYPE A & B) - Abnormal; Notable for the following components:   Influenza B By PCR POSITIVE (*)    All other components within normal limits  PREGNANCY, URINE  URINALYSIS, ROUTINE W REFLEX MICROSCOPIC    EKG None  Radiology Dg Chest 2 View  Result Date: 04/19/2018 CLINICAL DATA:  Cough and fever EXAM: CHEST - 2 VIEW  COMPARISON:  None. FINDINGS: The heart size and mediastinal contours are within normal limits. Both lungs are clear. The visualized skeletal structures are unremarkable. IMPRESSION: No active cardiopulmonary disease. Electronically Signed   By: Ulyses Jarred M.D.   On: 04/19/2018 23:58  Procedures Procedures (including critical care time)  Medications Ordered in ED Medications  acetaminophen (TYLENOL) tablet 1,000 mg (1,000 mg Oral Given 04/19/18 2256)  sodium chloride 0.9 % bolus 2,000 mL (0 mLs Intravenous Stopped 04/20/18 0106)  metoCLOPramide (REGLAN) injection 10 mg (10 mg Intravenous Given 04/19/18 2300)  ketorolac (TORADOL) 30 MG/ML injection 15 mg (15 mg Intravenous Given 04/19/18 2300)  ketorolac (TORADOL) 30 MG/ML injection 15 mg (15 mg Intravenous Given 04/20/18 0045)    2:30 AM Tolerating PO fluids.   Initial Impression / Assessment and Plan / ED Course  I have reviewed the triage vital signs and the nursing notes.  Pertinent labs & imaging results that were available during my care of the patient were reviewed by me and considered in my medical decision making (see chart for details).        33 year old female presents for febrile illness with associated headache, myalgias, cough, congestion, vomiting.  She tested positive for influenza B.  She has no leukocytosis or electrolyte derangements.  Liver and kidney function preserved.  Chest x-ray without evidence of secondary pneumonia.  She has had no hypoxia or respiratory distress while in the emergency department.  Tachycardia has improved with management of fever as well as 2 L IV fluids.  She is now tolerating oral fluids without vomiting.  Reports improvement in her headache following Toradol and Reglan.  Have counseled on continued supportive flu management.  Will discharge with Tessalon as well as Zofran.  Encouraged continued use of Tylenol and Motrin.  Return precautions discussed and provided. Patient discharged in stable  condition with no unaddressed concerns.   Final Clinical Impressions(s) / ED Diagnoses   Final diagnoses:  Influenza B    ED Discharge Orders         Ordered    ondansetron (ZOFRAN) 4 MG tablet  Every 8 hours PRN     04/20/18 0247    benzonatate (TESSALON) 100 MG capsule  3 times daily PRN     04/20/18 0247    ibuprofen (ADVIL,MOTRIN) 600 MG tablet  Every 6 hours PRN     04/20/18 0247           Antonietta Breach, PA-C 04/20/18 0301    Daleen Bo, MD 04/20/18 [phone]

## 2018-04-20 MED ORDER — IBUPROFEN 600 MG PO TABS: 600.0000 mg | ORAL_TABLET | Freq: Four times a day (QID) | ORAL | 0 refills | Status: DC | PRN

## 2018-04-20 MED ORDER — BENZONATATE 100 MG PO CAPS: 100.0000 mg | ORAL_CAPSULE | Freq: Three times a day (TID) | ORAL | 0 refills | Status: DC | PRN

## 2018-04-20 MED ORDER — ONDANSETRON HCL 4 MG PO TABS: 4.0000 mg | ORAL_TABLET | Freq: Three times a day (TID) | ORAL | 0 refills | Status: DC | PRN

## 2018-04-20 MED ORDER — KETOROLAC TROMETHAMINE 30 MG/ML IJ SOLN
15.0000 mg | Freq: Once | INTRAMUSCULAR | Status: AC
  Administered 2018-04-20: 15 mg via INTRAVENOUS
  Filled 2018-04-20: qty 1

## 2018-04-20 NOTE — Discharge Instructions (Signed)
We recommend 1000 mg Tylenol every 8 hours for management of fever and headache.  Take this medication with ibuprofen as prescribed.  You may use Tessalon as prescribed for management of cough.  If you continue to experience nausea and vomiting, take Zofran as prescribed.  Drink plenty of clear liquids to prevent dehydration.  Get plenty of rest over the next few days.  Flu symptoms may last 5 to 7 days before they largely improve.  We recommend follow-up with your primary care doctor to ensure resolution of symptoms.

## 2021-07-24 ENCOUNTER — Encounter (HOSPITAL_COMMUNITY): Payer: Self-pay | Admitting: Family Medicine

## 2021-07-24 ENCOUNTER — Inpatient Hospital Stay (HOSPITAL_COMMUNITY): Payer: Medicaid Other

## 2021-07-24 ENCOUNTER — Inpatient Hospital Stay (HOSPITAL_COMMUNITY)
Admission: AD | Admit: 2021-07-24 | Discharge: 2021-07-24 | Disposition: A | Discharge status: Home / Self Care | Payer: Medicaid Other | Attending: Family Medicine | Admitting: Family Medicine

## 2021-07-24 DIAGNOSIS — O209 Hemorrhage in early pregnancy, unspecified: Secondary | ICD-10-CM | POA: Diagnosis not present

## 2021-07-24 DIAGNOSIS — N898 Other specified noninflammatory disorders of vagina: Secondary | ICD-10-CM | POA: Insufficient documentation

## 2021-07-24 DIAGNOSIS — O09521 Supervision of elderly multigravida, first trimester: Secondary | ICD-10-CM | POA: Diagnosis not present

## 2021-07-24 DIAGNOSIS — R109 Unspecified abdominal pain: Secondary | ICD-10-CM | POA: Diagnosis not present

## 2021-07-24 DIAGNOSIS — Z3A01 Less than 8 weeks gestation of pregnancy: Secondary | ICD-10-CM | POA: Insufficient documentation

## 2021-07-24 DIAGNOSIS — Z3A00 Weeks of gestation of pregnancy not specified: Secondary | ICD-10-CM | POA: Diagnosis not present

## 2021-07-24 DIAGNOSIS — O3680X0 Pregnancy with inconclusive fetal viability, not applicable or unspecified: Secondary | ICD-10-CM | POA: Insufficient documentation

## 2021-07-24 DIAGNOSIS — O26891 Other specified pregnancy related conditions, first trimester: Secondary | ICD-10-CM | POA: Diagnosis not present

## 2021-07-24 LAB — CBC
HCT: 40 % (ref 36.0–46.0)
Hemoglobin: 12.9 g/dL (ref 12.0–15.0)
MCH: 27.4 pg (ref 26.0–34.0)
MCHC: 32.3 g/dL (ref 30.0–36.0)
MCV: 84.9 fL (ref 80.0–100.0)
Platelets: 330 10*3/uL (ref 150–400)
RBC: 4.71 MIL/uL (ref 3.87–5.11)
RDW: 13.8 % (ref 11.5–15.5)
WBC: 8.3 10*3/uL (ref 4.0–10.5)
nRBC: 0 % (ref 0.0–0.2)

## 2021-07-24 LAB — URINALYSIS, ROUTINE W REFLEX MICROSCOPIC
Bilirubin Urine: NEGATIVE
Glucose, UA: NEGATIVE mg/dL
Hgb urine dipstick: NEGATIVE
Ketones, ur: 20 mg/dL — AB
Nitrite: NEGATIVE
Protein, ur: NEGATIVE mg/dL
Specific Gravity, Urine: 1.031 — ABNORMAL HIGH (ref 1.005–1.030)
pH: 5 (ref 5.0–8.0)

## 2021-07-24 LAB — WET PREP, GENITAL
Sperm: NONE SEEN
Trich, Wet Prep: NONE SEEN
WBC, Wet Prep HPF POC: >=10 — AB (ref <10)

## 2021-07-24 LAB — HCG, QUANTITATIVE, PREGNANCY: hCG, Beta Chain, Quant, S: 4413 m[IU]/mL — ABNORMAL HIGH (ref <5)

## 2021-07-24 MED ORDER — TERCONAZOLE 0.4 % VA CREA: 1.0000 | TOPICAL_CREAM | Freq: Every day | VAGINAL | 0 refills | Status: DC

## 2021-07-24 NOTE — MAU Note (Signed)
Tina Melton is a 36 y.o. at Unknown here in MAU reporting: Bleeding yesterday like the start of a period but not as much today. Having epigastric pain.  LMP: 06/16/21 Onset of complaint: yesterday Pain score: 3/10 Vitals:   07/24/21 2006  BP: (!) 141/84  Pulse: 78  Resp: 16  Temp: 98.9 F (37.2 C)  SpO2: 98%     FHT: Lab orders placed from triage: Urinalysis

## 2021-07-24 NOTE — MAU Provider Note (Signed)
History     CSN: [id number]  Arrival date and time: 07/24/21 1660   Event Date/Time   First Provider Initiated Contact with Patient 07/24/21 2052      Chief Complaint  Patient presents with   Vaginal Bleeding   Abdominal Pain   HPI Tina Melton is a 36 y.o. [id number] at 11w3dby LMP who presents to MAU for vaginal bleeding. Patient reports bleeding started yesterday, however today bleeding is only when she wipes. She denies passing clots or tissue. She also reports some upper abdominal cramping that started yesterday as well. Pain is intermittent and is aggravated by positioning- mostly when sitting straight up. She reports some milky Zerbe discharge that sometimes itches and has an odor. Denies urinary s/s. LMP was 06/16/2021.  Does not have an OBGYN.    OB History     Gravida  5   Para  2   Term  2   Preterm  0   AB  2   Living  2      SAB  1   IAB  1   Ectopic  0   Multiple  0   Live Births  2           Past Medical History:  Diagnosis Date   Anemia    Arthritis    History of chicken pox    History of chlamydia    History of hemorrhage specific to perinatal period    6-7 mos bleeding x2, heavy bldg with 2nd preg was told baby nicked 2 blood vessels   Obesity    Panic attacks    Pregnancy induced hypertension    Yeast infection     Past Surgical History:  Procedure Laterality Date   INDUCED ABORTION      Family History  Problem Relation Age of Onset   Mental illness Mother        schizophrenia and panic attacks   Rheum arthritis Father    Arthritis Father    Birth defects Son        extra digit   Hypertension Maternal Grandmother    Stroke Maternal Grandmother    Dementia Maternal Grandmother    Diabetes Maternal Grandfather    Autoimmune disease Paternal Grandmother    Arthritis Paternal Grandmother    Diabetes Paternal Grandmother     Social History   Tobacco Use   Smoking status: Never   Smokeless tobacco: Never   Substance Use Topics   Alcohol use: No   Drug use: No    Allergies:  Allergies  Allergen Reactions   Lemon Juice Anaphylaxis    No medications prior to admission.    Review of Systems  Constitutional: Negative.   Respiratory: Negative.    Cardiovascular: Negative.   Gastrointestinal:  Positive for abdominal pain.  Genitourinary:  Positive for vaginal bleeding (spotting) and vaginal discharge.       Vaginal itching/odor  Musculoskeletal: Negative.   Neurological: Negative.    Physical Exam   Patient Vitals for the past 24 hrs:  BP Temp Temp src Pulse Resp SpO2 Height Weight  07/24/21 2314 (!) 132/92 -- -- 74 -- 99 % -- --  07/24/21 2006 (!) 141/84 98.9 F (37.2 C) Oral 78 16 98 % '5\' 11"'$  (1.803 m) 111.4 kg     Physical Exam Vitals and nursing note reviewed.  Constitutional:      General: She is not in acute distress. Eyes:     Extraocular Movements: Extraocular movements intact.  Pupils: Pupils are equal, round, and reactive to light.  Cardiovascular:     Rate and Rhythm: Normal rate.  Abdominal:     Palpations: Abdomen is soft.     Tenderness: There is no abdominal tenderness.  Genitourinary:    Comments: Patient self swabbed Musculoskeletal:        General: Normal range of motion.     Cervical back: Normal range of motion.  Skin:    General: Skin is warm and dry.  Neurological:     General: No focal deficit present.     Mental Status: She is alert and oriented to person, place, and time.  Psychiatric:        Mood and Affect: Mood normal.        Behavior: Behavior normal.        Thought Content: Thought content normal.        Judgment: Judgment normal.   US OB LESS THAN 14 WEEKS WITH OB TRANSVAGINAL  Result Date: 07/24/2021 CLINICAL DATA:  Vaginal bleeding and epigastric pain. EXAM: OBSTETRIC <14 WK [place] AND TRANSVAGINAL OB US TECHNIQUE: Both transabdominal and transvaginal ultrasound examinations were performed for complete evaluation of the gestation  as well as the maternal uterus, adnexal regions, and pelvic cul-de-sac. Transvaginal technique was performed to assess early pregnancy. COMPARISON:  None Available. FINDINGS: Intrauterine gestational sac: None Yolk sac:  Not Visualized. Embryo:  Not Visualized. Cardiac Activity: Not Visualized. Heart Rate: N/A  bpm Subchorionic hemorrhage:  None visualized. Maternal uterus/adnexae: Multiple small heterogeneous uterine fibroids are seen. The largest measures approximately 1.9 cm x 1.4 cm x 1.6 cm. The bilateral ovaries are visualized and are normal in appearance. There is no evidence of pelvic free fluid. IMPRESSION: 1. No evidence of an intrauterine pregnancy. Correlation with follow-up pelvic ultrasound and serial beta HCG levels is recommended if this remains of clinical concern. 2. Multiple heterogeneous uterine fibroids. Electronically Signed   By: Virgina Norfolk M.D.   On: 07/24/2021 22:05    MAU Course  Procedures  MDM UA unremarkable CBC unremarkable, HCG >4,000 Wet prep +yeast, GC/CT pending [place] with results as above. No evidence of IUP. Cannot rule out ectopic pregnancy or miscarriage.  D/w Dr. Kennon Rounds who recommends MTX. Reviewed recommendations with patient, however this is a highly desired pregnancy. We discussed follow up HCG in 48 hours, which patient is agreeable to. Due to patient's work schedule, she will return to MAU Sunday morning, 6/11 for repeat bHCG.   Assessment and Plan  Pregnancy of unknown location Vaginal bleeding  - Discharge home in stable condition - Rx for Terazol sent to pharmacy - Strict return precautions reviewed - Return to MAU on Sunday 6/11 for repeat bHCG or sooner for worsening symptoms   Renee Harder, CNM 07/24/2021, 11:23 PM

## 2021-07-27 ENCOUNTER — Inpatient Hospital Stay (HOSPITAL_COMMUNITY)
Admission: AD | Admit: 2021-07-27 | Discharge: 2021-07-27 | Disposition: A | Discharge status: Home / Self Care | Payer: Medicaid Other | Attending: Obstetrics and Gynecology | Admitting: Obstetrics and Gynecology

## 2021-07-27 ENCOUNTER — Other Ambulatory Visit (HOSPITAL_COMMUNITY)
Admit: 2021-07-27 | Discharge: 2021-07-27 | Disposition: A | Discharge status: Home / Self Care | Payer: Medicaid Other | Attending: Obstetrics and Gynecology | Admitting: Obstetrics and Gynecology

## 2021-07-27 DIAGNOSIS — Z3A01 Less than 8 weeks gestation of pregnancy: Secondary | ICD-10-CM | POA: Insufficient documentation

## 2021-07-27 DIAGNOSIS — O3411 Maternal care for benign tumor of corpus uteri, first trimester: Secondary | ICD-10-CM | POA: Insufficient documentation

## 2021-07-27 DIAGNOSIS — O3680X0 Pregnancy with inconclusive fetal viability, not applicable or unspecified: Secondary | ICD-10-CM

## 2021-07-27 DIAGNOSIS — D259 Leiomyoma of uterus, unspecified: Secondary | ICD-10-CM | POA: Diagnosis not present

## 2021-07-27 LAB — HCG, QUANTITATIVE, PREGNANCY: hCG, Beta Chain, Quant, S: 8665 m[IU]/mL — ABNORMAL HIGH (ref <5)

## 2021-07-27 MED ORDER — TERCONAZOLE 0.4 % VA CREA: 1.0000 | TOPICAL_CREAM | Freq: Every day | VAGINAL | 0 refills | Status: DC

## 2021-07-27 NOTE — MAU Provider Note (Signed)
History   Chief Complaint:  Follow-up   Tina Melton is  36 y.o. [id number] Patient's last menstrual period was 06/16/2021 (exact date).. Patient is here for follow up of quantitative HCG and ongoing surveillance of pregnancy status.   She is 58w6dweeks gestation  by LMP.    Since her last visit, the patient is without new complaint.   The patient reports bleeding as  none now.    General ROS:  negative  Her previous Quantitative HCG values are: 6/8, 4,413   Physical Exam  Blood pressure 126/78, pulse 86, temperature 98.2 F (36.8 C), temperature source Oral, resp. rate 15, last menstrual period 06/16/2021, SpO2 100 %. Focused Gynecological Exam: normal external genitalia, vulva, vagina, cervix, uterus and adnexa, examination not indicated  Labs: Results for orders placed or performed during the hospital encounter of 07/27/21 (from the past 24 hour(s))  hCG, quantitative, pregnancy   Collection Time: 07/27/21  7:54 AM  Result Value Ref Range   hCG, Beta Chain, Quant, S 8,665 (H) <5 mIU/mL    Ultrasound Studies:   [place] LESS THAN 14 WEEKS WITH OB TRANSVAGINAL  Result Date: 07/24/2021 CLINICAL DATA:  Vaginal bleeding and epigastric pain. EXAM: OBSTETRIC <14 WK [place] TRANSVAGINAL OB [place]CHNIQUE: Both transabdominal and transvaginal ultrasound examinations were performed for complete evaluation of the gestation as well as the maternal uterus, adnexal regions, and pelvic cul-de-sac. Transvaginal technique was performed to assess early pregnancy. COMPARISON:  None Available. FINDINGS: Intrauterine gestational sac: None Yolk sac:  Not Visualized. Embryo:  Not Visualized. Cardiac Activity: Not Visualized. Heart Rate: N/A  bpm Subchorionic hemorrhage:  None visualized. Maternal uterus/adnexae: Multiple small heterogeneous uterine fibroids are seen. The largest measures approximately 1.9 cm x 1.4 cm x 1.6 cm. The bilateral ovaries are visualized and are normal in appearance. There is no evidence  of pelvic free fluid. IMPRESSION: 1. No evidence of an intrauterine pregnancy. Correlation with follow-up pelvic ultrasound and serial beta HCG levels is recommended if this remains of clinical concern. 2. Multiple heterogeneous uterine fibroids. Electronically Signed   By: TVirgina NorfolkM.D.   On: 07/24/2021 22:05    Assessment:  519w6deeks gestation here for ongoing surveillance of pregnancy.  Plan: The patient is instructed to follow up in 1 week for [place] will call you to schedule.     RaNoni Saupe, NP 07/27/2021 10:00 AM

## 2021-07-27 NOTE — MAU Note (Signed)
Pt reports to mau for follow up lab work. Reports bleeding has almost completely stopped and denies pain today.

## 2021-07-27 NOTE — Discharge Instructions (Signed)
MAU [phone]

## 2021-07-28 LAB — GC/CHLAMYDIA PROBE AMP (~~LOC~~) NOT AT ARMC
Chlamydia: NEGATIVE
Comment: NEGATIVE
Comment: NORMAL
Neisseria Gonorrhea: NEGATIVE

## 2021-08-11 ENCOUNTER — Encounter (HOSPITAL_COMMUNITY): Payer: Self-pay | Admitting: Family Medicine

## 2021-08-11 ENCOUNTER — Inpatient Hospital Stay (EMERGENCY_DEPARTMENT_HOSPITAL): Payer: Medicaid Other | Admitting: Anesthesiology

## 2021-08-11 ENCOUNTER — Inpatient Hospital Stay (HOSPITAL_COMMUNITY): Payer: Medicaid Other

## 2021-08-11 ENCOUNTER — Encounter (HOSPITAL_COMMUNITY)
Admission: AD | Disposition: A | Discharge status: Home / Self Care | Payer: Self-pay | Source: Home / Self Care | Attending: Family Medicine

## 2021-08-11 ENCOUNTER — Other Ambulatory Visit: Payer: Self-pay

## 2021-08-11 ENCOUNTER — Inpatient Hospital Stay (HOSPITAL_COMMUNITY)
Admission: AD | Admit: 2021-08-11 | Discharge: 2021-08-11 | Disposition: A | Discharge status: Home / Self Care | Payer: Medicaid Other | Attending: Family Medicine | Admitting: Family Medicine

## 2021-08-11 ENCOUNTER — Inpatient Hospital Stay (HOSPITAL_COMMUNITY): Payer: Medicaid Other | Admitting: Anesthesiology

## 2021-08-11 DIAGNOSIS — Z9889 Other specified postprocedural states: Secondary | ICD-10-CM

## 2021-08-11 DIAGNOSIS — K661 Hemoperitoneum: Secondary | ICD-10-CM | POA: Insufficient documentation

## 2021-08-11 DIAGNOSIS — Z3A00 Weeks of gestation of pregnancy not specified: Secondary | ICD-10-CM | POA: Diagnosis not present

## 2021-08-11 DIAGNOSIS — O3410 Maternal care for benign tumor of corpus uteri, unspecified trimester: Secondary | ICD-10-CM | POA: Insufficient documentation

## 2021-08-11 DIAGNOSIS — R1030 Lower abdominal pain, unspecified: Secondary | ICD-10-CM | POA: Diagnosis not present

## 2021-08-11 DIAGNOSIS — O009 Unspecified ectopic pregnancy without intrauterine pregnancy: Secondary | ICD-10-CM

## 2021-08-11 DIAGNOSIS — O26891 Other specified pregnancy related conditions, first trimester: Secondary | ICD-10-CM | POA: Diagnosis not present

## 2021-08-11 DIAGNOSIS — O00102 Left tubal pregnancy without intrauterine pregnancy: Secondary | ICD-10-CM

## 2021-08-11 DIAGNOSIS — D259 Leiomyoma of uterus, unspecified: Secondary | ICD-10-CM

## 2021-08-11 DIAGNOSIS — Z3A01 Less than 8 weeks gestation of pregnancy: Secondary | ICD-10-CM | POA: Diagnosis not present

## 2021-08-11 HISTORY — PX: LAPAROSCOPIC UNILATERAL SALPINGECTOMY: SHX5934

## 2021-08-11 HISTORY — DX: Unspecified ectopic pregnancy without intrauterine pregnancy: O00.90

## 2021-08-11 LAB — CBC
HCT: 44.2 % (ref 36.0–46.0)
Hemoglobin: 13.7 g/dL (ref 12.0–15.0)
MCH: 27.1 pg (ref 26.0–34.0)
MCHC: 31 g/dL (ref 30.0–36.0)
MCV: 87.4 fL (ref 80.0–100.0)
Platelets: 346 10*3/uL (ref 150–400)
RBC: 5.06 MIL/uL (ref 3.87–5.11)
RDW: 14.5 % (ref 11.5–15.5)
WBC: 7.8 10*3/uL (ref 4.0–10.5)
nRBC: 0 % (ref 0.0–0.2)

## 2021-08-11 LAB — URINALYSIS, ROUTINE W REFLEX MICROSCOPIC
Bilirubin Urine: NEGATIVE
Glucose, UA: NEGATIVE mg/dL
Hgb urine dipstick: NEGATIVE
Ketones, ur: NEGATIVE mg/dL
Nitrite: POSITIVE — AB
Protein, ur: NEGATIVE mg/dL
Specific Gravity, Urine: 1.023 (ref 1.005–1.030)
WBC, UA: >50 WBC/hpf — ABNORMAL HIGH (ref 0–5)
pH: 5 (ref 5.0–8.0)

## 2021-08-11 LAB — BASIC METABOLIC PANEL
Anion gap: 12 (ref 5–15)
BUN: 6 mg/dL (ref 6–20)
CO2: 20 mmol/L — ABNORMAL LOW (ref 22–32)
Calcium: 8.8 mg/dL — ABNORMAL LOW (ref 8.9–10.3)
Chloride: 104 mmol/L (ref 98–111)
Creatinine, Ser: 0.66 mg/dL (ref 0.44–1.00)
GFR, Estimated: >60 mL/min (ref >60)
Glucose, Bld: 84 mg/dL (ref 70–99)
Potassium: 3.8 mmol/L (ref 3.5–5.1)
Sodium: 136 mmol/L (ref 135–145)

## 2021-08-11 LAB — TYPE AND SCREEN
ABO/RH(D): B POS
Antibody Screen: NEGATIVE

## 2021-08-11 LAB — ABO/RH: ABO/RH(D): B POS

## 2021-08-11 SURGERY — SALPINGECTOMY, UNILATERAL, LAPAROSCOPIC
Anesthesia: General | Laterality: Left

## 2021-08-11 MED ORDER — FENTANYL CITRATE (PF) 250 MCG/5ML IJ SOLN
INTRAMUSCULAR | Status: DC | PRN
  Administered 2021-08-11: 150 ug via INTRAVENOUS
  Administered 2021-08-11: 100 ug via INTRAVENOUS

## 2021-08-11 MED ORDER — KETOROLAC TROMETHAMINE 30 MG/ML IJ SOLN
INTRAMUSCULAR | Status: DC | PRN
  Administered 2021-08-11: 30 mg via INTRAVENOUS

## 2021-08-11 MED ORDER — ONDANSETRON HCL 4 MG/2ML IJ SOLN
INTRAMUSCULAR | Status: DC | PRN
  Administered 2021-08-11: 4 mg via INTRAVENOUS

## 2021-08-11 MED ORDER — PHENYLEPHRINE 80 MCG/ML (10ML) SYRINGE FOR IV PUSH (FOR BLOOD PRESSURE SUPPORT)
PREFILLED_SYRINGE | INTRAVENOUS | Status: AC
  Filled 2021-08-11: qty 20

## 2021-08-11 MED ORDER — CHLORHEXIDINE GLUCONATE 0.12 % MT SOLN
OROMUCOSAL | Status: AC
  Filled 2021-08-11: qty 15

## 2021-08-11 MED ORDER — FENTANYL CITRATE (PF) 250 MCG/5ML IJ SOLN
INTRAMUSCULAR | Status: AC
  Filled 2021-08-11: qty 5

## 2021-08-11 MED ORDER — PROPOFOL 10 MG/ML IV BOLUS
INTRAVENOUS | Status: DC | PRN
  Administered 2021-08-11: 140 mg via INTRAVENOUS

## 2021-08-11 MED ORDER — DEXAMETHASONE SODIUM PHOSPHATE 10 MG/ML IJ SOLN
INTRAMUSCULAR | Status: DC | PRN
  Administered 2021-08-11: 10 mg via INTRAVENOUS

## 2021-08-11 MED ORDER — FENTANYL CITRATE (PF) 100 MCG/2ML IJ SOLN
INTRAMUSCULAR | Status: AC
  Filled 2021-08-11: qty 2

## 2021-08-11 MED ORDER — SCOPOLAMINE 1 MG/3DAYS TD PT72
MEDICATED_PATCH | TRANSDERMAL | Status: AC
  Administered 2021-08-11: 1.5 mg via TRANSDERMAL
  Filled 2021-08-11: qty 1

## 2021-08-11 MED ORDER — DOCUSATE SODIUM 100 MG PO CAPS: 100.0000 mg | ORAL_CAPSULE | Freq: Two times a day (BID) | ORAL | 2 refills | Status: AC

## 2021-08-11 MED ORDER — LACTATED RINGERS IV SOLN: INTRAVENOUS | Status: DC

## 2021-08-11 MED ORDER — SCOPOLAMINE 1 MG/3DAYS TD PT72: 1.0000 | MEDICATED_PATCH | TRANSDERMAL | Status: DC

## 2021-08-11 MED ORDER — MIDAZOLAM HCL 2 MG/2ML IJ SOLN
INTRAMUSCULAR | Status: AC
  Filled 2021-08-11: qty 2

## 2021-08-11 MED ORDER — DEXAMETHASONE SODIUM PHOSPHATE 10 MG/ML IJ SOLN
INTRAMUSCULAR | Status: AC
Start: 2021-08-11 — End: ?
  Filled 2021-08-11: qty 1

## 2021-08-11 MED ORDER — SILVER NITRATE-POT NITRATE 75-25 % EX MISC
CUTANEOUS | Status: DC | PRN
  Administered 2021-08-11: 3 via TOPICAL

## 2021-08-11 MED ORDER — DOCUSATE SODIUM 100 MG PO CAPS: 100.0000 mg | ORAL_CAPSULE | Freq: Two times a day (BID) | ORAL | 2 refills | Status: DC

## 2021-08-11 MED ORDER — CHLORHEXIDINE GLUCONATE 0.12 % MT SOLN: 15.0000 mL | Freq: Once | OROMUCOSAL | Status: DC

## 2021-08-11 MED ORDER — CELECOXIB 200 MG PO CAPS: 200.0000 mg | ORAL_CAPSULE | Freq: Once | ORAL | Status: AC

## 2021-08-11 MED ORDER — BUPIVACAINE HCL (PF) 0.25 % IJ SOLN
INTRAMUSCULAR | Status: DC | PRN
  Administered 2021-08-11: 30 mL

## 2021-08-11 MED ORDER — IBUPROFEN 600 MG PO TABS
600.0000 mg | ORAL_TABLET | Freq: Four times a day (QID) | ORAL | 0 refills | Status: DC | PRN
Start: 2021-08-11 — End: 2021-10-13

## 2021-08-11 MED ORDER — AMISULPRIDE (ANTIEMETIC) 5 MG/2ML IV SOLN
INTRAVENOUS | Status: AC
  Filled 2021-08-11: qty 4

## 2021-08-11 MED ORDER — BUPIVACAINE HCL (PF) 0.25 % IJ SOLN
INTRAMUSCULAR | Status: AC
  Filled 2021-08-11: qty 30

## 2021-08-11 MED ORDER — LIDOCAINE 2% (20 MG/ML) 5 ML SYRINGE
INTRAMUSCULAR | Status: AC
  Filled 2021-08-11: qty 5

## 2021-08-11 MED ORDER — CELECOXIB 200 MG PO CAPS
ORAL_CAPSULE | ORAL | Status: AC
  Administered 2021-08-11: 200 mg via ORAL
  Filled 2021-08-11: qty 1

## 2021-08-11 MED ORDER — SUGAMMADEX SODIUM 200 MG/2ML IV SOLN
INTRAVENOUS | Status: DC | PRN
  Administered 2021-08-11: 400 mg via INTRAVENOUS

## 2021-08-11 MED ORDER — ACETAMINOPHEN 500 MG PO TABS
ORAL_TABLET | ORAL | Status: AC
  Administered 2021-08-11: 1000 mg via ORAL
  Filled 2021-08-11: qty 2

## 2021-08-11 MED ORDER — PROMETHAZINE HCL 25 MG/ML IJ SOLN: 6.2500 mg | INTRAMUSCULAR | Status: DC | PRN

## 2021-08-11 MED ORDER — ONDANSETRON HCL 4 MG/2ML IJ SOLN
INTRAMUSCULAR | Status: AC
  Filled 2021-08-11: qty 2

## 2021-08-11 MED ORDER — PROPOFOL 10 MG/ML IV BOLUS
INTRAVENOUS | Status: AC
  Filled 2021-08-11: qty 20

## 2021-08-11 MED ORDER — FENTANYL CITRATE (PF) 100 MCG/2ML IJ SOLN
25.0000 ug | INTRAMUSCULAR | Status: DC | PRN
  Administered 2021-08-11: 50 ug via INTRAVENOUS

## 2021-08-11 MED ORDER — OXYCODONE-ACETAMINOPHEN 5-325 MG PO TABS: 1.0000 | ORAL_TABLET | Freq: Four times a day (QID) | ORAL | 0 refills | Status: DC | PRN

## 2021-08-11 MED ORDER — ORAL CARE MOUTH RINSE: 15.0000 mL | Freq: Once | OROMUCOSAL | Status: DC

## 2021-08-11 MED ORDER — SODIUM CHLORIDE 0.9 % IR SOLN
Status: DC | PRN
  Administered 2021-08-11: 1000 mL

## 2021-08-11 MED ORDER — AMISULPRIDE (ANTIEMETIC) 5 MG/2ML IV SOLN
10.0000 mg | Freq: Once | INTRAVENOUS | Status: AC | PRN
Start: 2021-08-11 — End: 2021-08-11
  Administered 2021-08-11: 10 mg via INTRAVENOUS

## 2021-08-11 MED ORDER — PHENYLEPHRINE HCL (PRESSORS) 10 MG/ML IV SOLN
INTRAVENOUS | Status: DC | PRN
  Administered 2021-08-11: 100 ug via INTRAVENOUS

## 2021-08-11 MED ORDER — ACETAMINOPHEN 500 MG PO TABS: 1000.0000 mg | ORAL_TABLET | Freq: Once | ORAL | Status: AC

## 2021-08-11 MED ORDER — POVIDONE-IODINE 10 % EX SWAB
2.0000 | Freq: Once | CUTANEOUS | Status: AC
  Administered 2021-08-11: 2 via TOPICAL

## 2021-08-11 MED ORDER — LIDOCAINE 2% (20 MG/ML) 5 ML SYRINGE
INTRAMUSCULAR | Status: DC | PRN
  Administered 2021-08-11: 80 mg via INTRAVENOUS

## 2021-08-11 MED ORDER — ROCURONIUM BROMIDE 10 MG/ML (PF) SYRINGE
PREFILLED_SYRINGE | INTRAVENOUS | Status: DC | PRN
  Administered 2021-08-11: 70 mg via INTRAVENOUS

## 2021-08-11 MED ORDER — ROCURONIUM BROMIDE 10 MG/ML (PF) SYRINGE
PREFILLED_SYRINGE | INTRAVENOUS | Status: AC
  Filled 2021-08-11: qty 10

## 2021-08-11 SURGICAL SUPPLY — 32 items
DERMABOND ADVANCED (GAUZE/BANDAGES/DRESSINGS) ×1
DERMABOND ADVANCED .7 DNX12 (GAUZE/BANDAGES/DRESSINGS) ×2 IMPLANT
DRSG OPSITE POSTOP 3X4 (GAUZE/BANDAGES/DRESSINGS) ×2 IMPLANT
DURAPREP 26ML APPLICATOR (WOUND CARE) ×3 IMPLANT
GLOVE BIOGEL PI IND STRL 7.0 (GLOVE) ×8 IMPLANT
GLOVE BIOGEL PI INDICATOR 7.0 (GLOVE) ×4
GLOVE ECLIPSE 7.0 STRL STRAW (GLOVE) ×3 IMPLANT
GLOVE SURG ENC MOIS LTX SZ7 (GLOVE) ×3 IMPLANT
GOWN STRL REUS W/ TWL LRG LVL3 (GOWN DISPOSABLE) ×6 IMPLANT
GOWN STRL REUS W/TWL LRG LVL3 (GOWN DISPOSABLE) ×3
IRRIG SUCT STRYKERFLOW 2 WTIP (MISCELLANEOUS) ×3
IRRIGATION SUCT STRKRFLW 2 WTP (MISCELLANEOUS) ×2 IMPLANT
KIT TURNOVER KIT B (KITS) ×3 IMPLANT
PACK LAPAROSCOPY BASIN (CUSTOM PROCEDURE TRAY) ×3 IMPLANT
PACK TRENDGUARD 450 HYBRID PRO (MISCELLANEOUS) ×1 IMPLANT
POUCH SPECIMEN RETRIEVAL 10MM (ENDOMECHANICALS) ×2 IMPLANT
PROTECTOR NERVE ULNAR (MISCELLANEOUS) ×6 IMPLANT
SET TUBE SMOKE EVAC HIGH FLOW (TUBING) ×3 IMPLANT
SHEARS HARMONIC ACE PLUS 36CM (ENDOMECHANICALS) ×2 IMPLANT
SLEEVE ENDOPATH XCEL 5M (ENDOMECHANICALS) ×3 IMPLANT
SPONGE T-LAP 18X18 ~~LOC~~+RFID (SPONGE) ×3 IMPLANT
SUT VIC AB 3-0 PS2 18 (SUTURE) ×1
SUT VIC AB 3-0 PS2 18XBRD (SUTURE) ×2 IMPLANT
SUT VICRYL 0 UR6 27IN ABS (SUTURE) ×6 IMPLANT
SUT VICRYL 4-0 PS2 18IN ABS (SUTURE) ×3 IMPLANT
SYSTEM CARTER THOMASON II (TROCAR) ×2 IMPLANT
TOWEL GREEN STERILE FF (TOWEL DISPOSABLE) ×6 IMPLANT
TRAY FOLEY W/BAG SLVR 14FR (SET/KITS/TRAYS/PACK) ×3 IMPLANT
TRENDGUARD 450 HYBRID PRO PACK (MISCELLANEOUS) ×3
TROCAR XCEL NON-BLD 11X100MML (ENDOMECHANICALS) ×2 IMPLANT
TROCAR XCEL NON-BLD 5MMX100MML (ENDOMECHANICALS) ×3 IMPLANT
WARMER LAPAROSCOPE (MISCELLANEOUS) ×3 IMPLANT

## 2021-08-11 NOTE — Anesthesia Procedure Notes (Signed)
Procedure Name: Intubation Date/Time: 08/11/2021 7:32 PM  Performed by: Gwenyth Allegra, CRNAPre-anesthesia Checklist: Patient identified, Emergency Drugs available, Suction available, Patient being monitored and Timeout performed Patient Re-evaluated:Patient Re-evaluated prior to induction Oxygen Delivery Method: Circle system utilized Preoxygenation: Pre-oxygenation with 100% oxygen Induction Type: IV induction Ventilation: Mask ventilation without difficulty Laryngoscope Size: Mac and 4 Grade View: Grade II Tube type: Oral Tube size: 7.0 mm Placement Confirmation: ETT inserted through vocal cords under direct vision, positive ETCO2 and breath sounds checked- equal and bilateral Secured at: 22 cm Tube secured with: Tape

## 2021-08-11 NOTE — H&P (Signed)
[facility] OB[facility]N History and physical   History     CSN: [id number]  Arrival date & time 08/11/21  1319   Chief Complaint  Patient presents with   Abdominal Pain    Tina Melton is a 36 yo female G5P2022 presenting for a evaluation of lower abdominal pain, found to have a 4 cm live left adnexal ectopic pregnancy.    She had been following B hcg due to pregnancy of unknown location. She had a rise of B-Hcg from 6/8 to 6/11 [phone]). A repeat ob US was ordered but never completed.    Since her initial MAU evaluation on 6/8, she has been having intermittent severe sharp lower abdominal pain. No vaginal bleeding. Reports pain is so severe that it has caused N/V. Last pain overnight, no pain currently. Last ate 1700 yesterday evening. No previous surgeries.    OB History  Gravida Para Term Preterm AB Living  5 2 2  0 2 2  SAB IAB Ectopic Multiple Live Births  1 1 0 0 2    # Outcome Date GA Lbr Len/2nd Weight Sex Delivery Anes PTL Lv  5 Current           4 Term 04/17/11 [redacted]w[redacted]d 04:18 / 00:11 3014 g M Vag-Spont EPI  LIV     Name: Ethington,BOY Jose     Apgar1: 9  Apgar5: 9  3 IAB 2009 [redacted]w[redacted]d         2 Term 2005 [redacted]w[redacted]d 10:00 2551 g M Vag-Spont None  LIV  1 SAB 2001 [redacted]w[redacted]d           Past Medical History:  Diagnosis Date   Anemia    Arthritis    History of chicken pox    History of chlamydia    History of hemorrhage specific to perinatal period    6-7 mos bleeding x2, heavy bldg with 2nd preg was told baby nicked 2 blood vessels   Obesity    Panic attacks    Pregnancy induced hypertension    Yeast infection     Past Surgical History:  Procedure Laterality Date   INDUCED ABORTION      Family History  Problem Relation Age of Onset   Mental illness Mother        schizophrenia and panic attacks   Rheum arthritis Father    Arthritis Father    Birth defects Son        extra digit   Hypertension Maternal Grandmother    Stroke Maternal Grandmother    Dementia Maternal  Grandmother    Diabetes Maternal Grandfather    Autoimmune disease Paternal Grandmother    Arthritis Paternal Grandmother    Diabetes Paternal Grandmother     Social History   Tobacco Use   Smoking status: Never   Smokeless tobacco: Never  Substance Use Topics   Alcohol use: No   Drug use: No    Allergies  Allergen Reactions   Lemon Juice Anaphylaxis    Medications Prior to Admission  Medication Sig Dispense Refill Last Dose   acetaminophen (TYLENOL) 500 MG tablet Take 1,000 mg by mouth every 6 (six) hours as needed for mild pain or headache.      terconazole (TERAZOL 7) 0.4 % vaginal cream Place 1 applicator vaginally at bedtime. Use for seven days 45 g 0    Review of Systems  Constitutional:  Negative for fatigue and fever.  Respiratory:  Negative for chest tightness and shortness of breath.   Cardiovascular:  Negative  for chest pain.  Gastrointestinal:  Positive for abdominal pain, nausea and vomiting. Negative for constipation and diarrhea.  Genitourinary:  Positive for frequency. Negative for vaginal bleeding.  Skin:  Negative for color change and pallor.  Neurological:  Negative for dizziness, syncope, weakness, light-headedness, numbness and headaches.  Psychiatric/Behavioral:  Negative for behavioral problems.    Physical Exam  BP 131/72   Pulse 73   Temp 98.1 F (36.7 C)   Resp 18   Ht 5\' 11"  (1.803 m)   Wt 114.3 kg   LMP 06/16/2021 (Exact Date)   BMI 35.15 kg/m   Physical Exam Constitutional:      General: She is not in acute distress.    Appearance: She is well-developed. She is not ill-appearing or diaphoretic.  HENT:     Head: Normocephalic and atraumatic.  Eyes:     Extraocular Movements: Extraocular movements intact.  Cardiovascular:     Rate and Rhythm: Normal rate.  Pulmonary:     Effort: Pulmonary effort is normal.  Abdominal:     General: Abdomen is flat.     Palpations: Abdomen is soft.     Tenderness: There is no abdominal  tenderness. There is no guarding or rebound.  Genitourinary:    Comments: deferred Skin:    General: Skin is warm and dry.     Capillary Refill: Capillary refill takes less than 2 seconds.  Neurological:     General: No focal deficit present.     Mental Status: She is alert and oriented to person, place, and time.  Psychiatric:        Mood and Affect: Mood normal.        Behavior: Behavior normal.      MAU Course/MDM  UA  OB US  Pictures shown to patient.   Labs and Imaging   Results for orders placed or performed during the hospital encounter of 08/11/21 (from the past 24 hour(s))  Urinalysis, Routine w reflex microscopic Urine, Clean Catch     Status: Abnormal   Collection Time: 08/11/21  3:33 PM  Result Value Ref Range   Color, Urine AMBER (A) YELLOW   APPearance HAZY (A) CLEAR   Specific Gravity, Urine 1.023 1.005 - 1.030   pH 5.0 5.0 - 8.0   Glucose, UA NEGATIVE NEGATIVE mg/dL   Hgb urine dipstick NEGATIVE NEGATIVE   Bilirubin Urine NEGATIVE NEGATIVE   Ketones, ur NEGATIVE NEGATIVE mg/dL   Protein, ur NEGATIVE NEGATIVE mg/dL   Nitrite POSITIVE (A) NEGATIVE   Leukocytes,Ua SMALL (A) NEGATIVE   RBC / HPF 0-5 0 - 5 RBC/hpf   WBC, UA >50 (H) 0 - 5 WBC/hpf   Bacteria, UA MANY (A) NONE SEEN   Squamous Epithelial / LPF 0-5 0 - 5   Mucus PRESENT    US OB Transvaginal  Result Date: 08/11/2021 CLINICAL DATA:  Lower abdominal pain EXAM: TRANSVAGINAL OB ULTRASOUND TECHNIQUE: Transvaginal ultrasound was performed for complete evaluation of the gestation as well as the maternal uterus, adnexal regions, and pelvic cul-de-sac. COMPARISON:  07/24/2021 FINDINGS: Intrauterine gestational sac: No intrauterine gestational sac visualized. Gestational sac present in the left adnexa Yolk sac:  Visualized in the left adnexa Embryo:  Visualized in the left adnexa Cardiac Activity: Appears to be present on cine images but heart rate could not be documented. CRL:   8.3 mm   6 w 5 d                   [place] EDC:  04/01/2022 Subchorionic hemorrhage:  None visualized. Maternal uterus/adnexae: Multiple uterine fibroids, the largest is seen within the posterior uterine corpus and measures 2.7 cm. Trace anechoic free fluid. Right ovary within normal limits and measures 2.7 x 1.6 x 2.3 cm. The left ovary measures 2 by 3.2 x 2.3 cm. Left adnexal ectopic pregnancy measures 4.4 x 4.5 x 3.9 cm. IMPRESSION: 1. No IUP identified. 4.4 x 4.5 x 3.9 cm live ectopic pregnancy in the left adnexa. No evidence for ruptured ectopic at this time. There is trace anechoic free fluid in the pelvis. Critical Value/emergent results were called by telephone at the time of interpretation on 08/11/2021 at 4:02 pm to provider [facility] , who verbally acknowledged these results. Electronically Signed   By: Jasmine Pang M.D.   On: 08/11/2021 16:02    Assessment and Plan   Live ectopic pregnancy in left adnexa    4 cm live ectopic as noted in [place] above. Last meal yesterday evening. Hemodynamically stable and with benign abdomen currently.    Plan for diagnostic laparoscopy with removal of ectopic pregnancy tonight with Dr. Vergie Living.     Allayne Stack, DO

## 2021-08-12 ENCOUNTER — Encounter (HOSPITAL_COMMUNITY): Payer: Self-pay | Admitting: Obstetrics and Gynecology

## 2021-08-13 ENCOUNTER — Other Ambulatory Visit: Payer: Self-pay | Admitting: Family Medicine

## 2021-08-13 DIAGNOSIS — N3000 Acute cystitis without hematuria: Secondary | ICD-10-CM

## 2021-08-13 LAB — SURGICAL PATHOLOGY

## 2021-08-13 MED ORDER — CEFADROXIL 500 MG PO CAPS: 500.0000 mg | ORAL_CAPSULE | Freq: Two times a day (BID) | ORAL | 0 refills | Status: AC

## 2021-08-14 LAB — CULTURE, OB URINE: Culture: >=100000 — AB

## 2021-08-18 ENCOUNTER — Telehealth: Payer: Self-pay

## 2021-08-18 NOTE — Telephone Encounter (Signed)
Notified pt of appt scheduled with Dr. Ilda Basset. Provided facility address.

## 2021-09-18 ENCOUNTER — Encounter: Payer: Self-pay | Admitting: *Deleted

## 2021-09-18 ENCOUNTER — Ambulatory Visit (INDEPENDENT_AMBULATORY_CARE_PROVIDER_SITE_OTHER): Payer: Medicaid Other | Admitting: Obstetrics and Gynecology

## 2021-09-18 VITALS — BP 148/87 | HR 77 | Wt 251.0 lb

## 2021-09-18 DIAGNOSIS — L02212 Cutaneous abscess of back [any part, except buttock]: Secondary | ICD-10-CM | POA: Diagnosis not present

## 2021-09-18 DIAGNOSIS — Z9889 Other specified postprocedural states: Secondary | ICD-10-CM | POA: Diagnosis not present

## 2021-09-18 DIAGNOSIS — Z87448 Personal history of other diseases of urinary system: Secondary | ICD-10-CM

## 2021-09-18 HISTORY — DX: Personal history of other diseases of urinary system: Z87.448

## 2021-09-18 MED ORDER — SULFAMETHOXAZOLE-TRIMETHOPRIM 400-80 MG PO TABS: 1.0000 | ORAL_TABLET | Freq: Two times a day (BID) | ORAL | 0 refills | Status: AC

## 2021-09-18 NOTE — Progress Notes (Signed)
Patient here for F/U ectopic pregnancy.   Laparoscopic left salpingectomy on 08/11/21  Last pap: 06/2021 Dr.Reese at [facility]   CC: Left sided pain and tenderness. No vaginal bleeding today.  Painful knot on upper right side of pt back.   LMP: 09/09/21 -09/14/21.

## 2021-09-18 NOTE — Progress Notes (Signed)
Obstetrics and Gynecology Visit Return Patient Evaluation  Appointment Date: 09/18/2021  Primary Care Provider: Practice, [place] for [facility]  Chief Complaint: regular post op visit  History of Present Illness:  Tina Melton is a 36 y.o. s/p 6/26 l/s left salpingectomy for ruptured ectopic pregnancy. She was discharged to home from the pacu.   Post op: patient still a little sore at the umblicus. She has had a period since the surgery  History hematuria: patient has had two recent episodes of hematuria most recently about a week ago. No prior history. No lower urinary tract s/s. She is sure the bleeding is not vaginal  Back knot: noticed about two weeks ago on the right back near the bra strap; no prior history and no axilla or breast s/s. She states it feels that it's gotten bigger.   Review of Systems: as noted in the History of Present Illness.  Patient Active Problem List   Diagnosis Date Noted   Ectopic pregnancy without intrauterine pregnancy 08/11/2021   Panic attacks 05/29/2011   Obesity 04/17/2011   Proteinuria 04/17/2011   Medications:  Tina Melton had no medications administered during this visit. Current Outpatient Medications  Medication Sig Dispense Refill   acetaminophen (TYLENOL) 500 MG tablet Take 1,000 mg by mouth every 6 (six) hours as needed for mild pain or headache.     ibuprofen (ADVIL) 600 MG tablet Take 1 tablet (600 mg total) by mouth every 6 (six) hours as needed. 20 tablet 0   oxyCODONE-acetaminophen (PERCOCET/ROXICET) 5-325 MG tablet Take 1 tablet by mouth every 6 (six) hours as needed. 10 tablet 0   terconazole (TERAZOL 7) 0.4 % vaginal cream Place 1 applicator vaginally at bedtime. Use for seven days 45 g 0   No current facility-administered medications for this visit.    Allergies: is allergic to lemon juice.  Physical Exam:  BP (!) 148/87   Pulse 77   Wt 251 lb (113.9 kg)   LMP  09/09/2021 (Approximate)   BMI 35.01 kg/m  Body mass index is 35.01 kg/m. General appearance: Well nourished, well developed female in no acute distress.  Back: 4-5cm fluctuant mildly ttp abscess like area over the right scapula. Normal overlying skin and normal axilla and brast.  Abdomen: diffusely non tender to palpation, non distended, and no masses, hernias. Well healed l/s port sites x 3 Neuro/Psych:  Normal mood and affect.    Assessment: pt stable  Plan:  1. History of hematuria - Urinalysis, Routine w reflex microscopic - Urine Culture - Ambulatory referral to Urology  2. Back abscess Will send in 2wk course of bactrim and pt told to follow up with PCP. Will order back u/s - [place] CHEST SOFT TISSUE; Future  3. Status post surgery Healing well. Pt may want to try again, declines birth control. I told her that since she has had a period that she could get pregnant again. If she does, pt told to call us asap to follow her hormone levels and get early u/s as she is at risk for repeat ectopic.    RTC: PRN  Durene Romans MD Attending Center for [facility])

## 2021-09-19 ENCOUNTER — Ambulatory Visit: Payer: Medicaid Other | Admitting: Obstetrics and Gynecology

## 2021-09-19 ENCOUNTER — Encounter: Payer: Self-pay | Admitting: Obstetrics and Gynecology

## 2021-09-19 LAB — URINALYSIS, ROUTINE W REFLEX MICROSCOPIC
Bilirubin, UA: NEGATIVE
Glucose, UA: NEGATIVE
Ketones, UA: NEGATIVE
Nitrite, UA: NEGATIVE
Protein,UA: NEGATIVE
RBC, UA: NEGATIVE
Specific Gravity, UA: 1.02 (ref 1.005–1.030)
Urobilinogen, Ur: 0.2 mg/dL (ref 0.2–1.0)
pH, UA: 6 (ref 5.0–7.5)

## 2021-09-19 LAB — MICROSCOPIC EXAMINATION
Casts: NONE SEEN /lpf
RBC, Urine: NONE SEEN /hpf (ref 0–2)
WBC, UA: >30 /hpf — AB (ref 0–5)

## 2021-09-21 LAB — URINE CULTURE

## 2021-09-22 ENCOUNTER — Ambulatory Visit: Payer: Medicaid Other | Admitting: Family Medicine

## 2021-09-22 MED ORDER — NITROFURANTOIN MONOHYD MACRO 100 MG PO CAPS: 100.0000 mg | ORAL_CAPSULE | Freq: Two times a day (BID) | ORAL | 0 refills | Status: AC

## 2021-09-22 NOTE — Addendum Note (Signed)
Addended by: Aletha Halim on: 09/22/2021 01:25 PM   Modules accepted: Orders

## 2021-09-29 ENCOUNTER — Ambulatory Visit
Admission: RE | Admit: 2021-09-29 | Discharge: 2021-09-29 | Disposition: A | Discharge status: Home / Self Care | Payer: Medicaid Other | Source: Ambulatory Visit | Attending: Obstetrics and Gynecology | Admitting: Obstetrics and Gynecology

## 2021-09-29 DIAGNOSIS — R222 Localized swelling, mass and lump, trunk: Secondary | ICD-10-CM | POA: Diagnosis not present

## 2021-09-29 DIAGNOSIS — L02212 Cutaneous abscess of back [any part, except buttock]: Secondary | ICD-10-CM

## 2021-10-07 ENCOUNTER — Other Ambulatory Visit: Payer: Self-pay | Admitting: Obstetrics and Gynecology

## 2021-10-07 DIAGNOSIS — R222 Localized swelling, mass and lump, trunk: Secondary | ICD-10-CM | POA: Insufficient documentation

## 2021-10-13 ENCOUNTER — Ambulatory Visit: Payer: Medicaid Other | Admitting: Surgery

## 2021-10-13 ENCOUNTER — Ambulatory Visit (INDEPENDENT_AMBULATORY_CARE_PROVIDER_SITE_OTHER): Payer: Medicaid Other | Admitting: Surgery

## 2021-10-13 ENCOUNTER — Encounter: Payer: Self-pay | Admitting: Surgery

## 2021-10-13 VITALS — BP 133/88 | HR 100 | Temp 98.0°F | Ht 71.0 in | Wt 249.0 lb

## 2021-10-13 DIAGNOSIS — D171 Benign lipomatous neoplasm of skin and subcutaneous tissue of trunk: Secondary | ICD-10-CM | POA: Diagnosis not present

## 2021-10-13 NOTE — H&P (View-Only) (Signed)
10/13/2021  Reason for Visit:  Right upper back mass  Requesting Provider:  Aletha Halim, MD  History of Present Illness: Tina Melton is a 36 y.o. female presenting for evaluation of a right upper back mass.  The patient had surgery for ectopic pregnancy in June 2023 and noticed after that she had a mass in the right upper back, posterior to the right shoulder, and reports that it has been growing in size.  She also reports that it is tender, particularly over the lateral aspect of it.  She had an ultrasound on 09/29/21 which saw a 10.9 x 3.3 x 10.5 cm mass, possibly a lipoma.  Denies any range of motion issues, but the bra strap rubs on the mass which makes it more aggravated.  Denies any redness of the skin or drainage.  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   History of chicken pox    History of chlamydia    History of hemorrhage specific to perinatal period    6-7 mos bleeding x2, heavy bldg with 2nd preg was told baby nicked 2 blood vessels   Obesity    Panic attacks    Pregnancy induced hypertension    Yeast infection      Past Surgical History: Past Surgical History:  Procedure Laterality Date   INDUCED ABORTION     LAPAROSCOPIC UNILATERAL SALPINGECTOMY Left 08/11/2021   Procedure: LAPAROSCOPIC LEFT SALPINGECTOMY WITH REMOVAL OF ECTOPIC PREGNANCY;  Surgeon: Aletha Halim, MD;  Location: [place];  Service: Gynecology;  Laterality: Left;    Home Medications: Prior to Admission medications   Not on File    Allergies: Allergies  Allergen Reactions   Lemon Juice Anaphylaxis   Lemon Oil Anaphylaxis    Social History:  reports that she has never smoked. She has never used smokeless tobacco. She reports that she does not drink alcohol and does not use drugs.   Family History: Family History  Problem Relation Age of Onset   Mental illness Mother        schizophrenia and panic attacks   Rheum  arthritis Father    Arthritis Father    Birth defects Son        extra digit   Hypertension Maternal Grandmother    Stroke Maternal Grandmother    Dementia Maternal Grandmother    Diabetes Maternal Grandfather    Autoimmune disease Paternal Grandmother    Arthritis Paternal Grandmother    Diabetes Paternal Grandmother     Review of Systems: Review of Systems  Constitutional:  Positive for weight loss (intentional weight loss 130 lbs over 2 years). Negative for chills and fever.  Respiratory:  Negative for shortness of breath.   Cardiovascular:  Negative for chest pain.  Gastrointestinal:  Negative for abdominal pain, nausea and vomiting.  Genitourinary:  Negative for dysuria.  Musculoskeletal:  Negative for myalgias.  Skin:  Negative for rash.       Right upper back mass  Neurological:  Negative for dizziness.  Psychiatric/Behavioral:  Negative for depression.     Physical Exam BP 133/88   Pulse 100   Temp 98 F (36.7 C)   Ht '5\' 11"'$  (1.803 m)   Wt 249 lb (112.9 kg)   LMP 10/04/2021 (Exact Date)   SpO2 100%   BMI 34.73 kg/m  CONSTITUTIONAL: No acute distress HEENT:  Normocephalic, atraumatic, extraocular motion intact. NECK: Trachea is midline, and there is  no jugular venous distension.  RESPIRATORY:  Lungs are clear, and breath sounds are equal bilaterally. Normal respiratory effort without pathologic use of accessory muscles. CARDIOVASCULAR: Heart is regular without murmurs, gallops, or rubs. MUSCULOSKELETAL:  Normal muscle strength and tone in all four extremities.  No peripheral edema or cyanosis. SKIN:  The patient has a 10 cm mass in the right upper back, posterior to the right shoulder, which is soft, mobile, and tender over the lateral aspect of it.  I performed a bedside ultrasound of the mass and it is echogenic rather than cystic in appearance, more consistent with a lipoma.  It does appear to be multilobulated, particularly as it goes lateral.    NEUROLOGIC:   Motor and sensation is grossly normal.  Cranial nerves are grossly intact. PSYCH:  Alert and oriented to person, place and time. Affect is normal.  Laboratory Analysis: Labs on 08/11/21: Na 136, K 3.8, Cl 104, CO2 20, BUN 6, Cr 0.66.  WBC 7.8, Hgb 13.7, Hct 44.2, Plt 346.  Imaging: U/S Chest soft tissue on 09/29/21: IMPRESSION: A 10.9 x 3.3 x 10.5 cm well-circumscribed slightly hypoechoic mass is noted over the right scapular region in the region of clinical concern. Although this may represent a benign lipoma, it does not have an echodense texture typical of a lipoma. Further evaluation of this mass with MRI suggested.    Assessment and Plan: This is a 36 y.o. female with a right upper back lipoma.  --Discussed with the patient that based on the characteristics of the mass on exam and bedside ultrasound, I think this is a lipoma.  Discussed with her that this is a benign mass, and likely it appears that it's grown as the patient has been losing weight.  The mass is causing symptoms of pain, particularly over the lateral aspect of it, and it may be due to mass effect.   --Discussed that this mass can be excised, but given the size, I think it would be best to do this in the operating room under general anesthesia.  Discussed that given the size also, we would likely leave a drain to help with fluid control and the layers to heal together more easily.  Reviewed the surgery at length with her including the incision, risks of bleeding, infection, injury to surrounding structures, that this would be an outpatient procedure, activity restrictions, pain control, and she's willing to proceed. --Will schedule her for surgery on 10/21/21.  I spent 40 minutes dedicated to the care of this patient on the date of this encounter to include pre-visit review of records, face-to-face time with the patient discussing diagnosis and management, and any post-visit coordination of care.   Melvyn Neth,  [facility]

## 2021-10-13 NOTE — Patient Instructions (Signed)
We have spoken today about having your lipoma removed in the OR. This will be done at [facility] by Dr Hampton Abbot.  Please see your Blue surgery sheet for more information.   Our surgery scheduler will call you to look at surgery dates and to go over surgery information.   Lipoma Removal Lipoma removal is a surgical procedure to remove a noncancerous (benign) tumor that is made up of fat cells (lipoma). Most lipomas are small and painless and do not require treatment. They can form in many areas of the body but are most common under the skin of the back, shoulders, arms, and thighs. You may need lipoma removal if you have a lipoma that is large, growing, or causing discomfort. Lipoma removal may also be done for cosmetic reasons. Tell a health care provider about: Any allergies you have. All medicines you are taking, including vitamins, herbs, eye drops, creams, and over-the-counter medicines. Any problems you or family members have had with anesthetic medicines. Any blood disorders you have. Any surgeries you have had. Any medical conditions you have. Whether you are pregnant or may be pregnant. What are the risks? Generally, this is a safe procedure. However, problems may occur, including: Infection. Bleeding. Allergic reactions to medicines. Damage to nerves or blood vessels near the lipoma. Scarring.  What happens before the procedure? Staying hydrated Follow instructions from your health care provider about hydration, which may include: Up to 2 hours before the procedure - you may continue to drink clear liquids, such as water, clear fruit juice, black coffee, and plain tea.  Medicines Ask your health care provider about: Changing or stopping your regular medicines. This is especially important if you are taking diabetes medicines or blood thinners. Taking medicines such as aspirin and ibuprofen. These medicines can thin your blood. Do not take these medicines before your procedure if  your health care provider instructs you not to. You may be given antibiotic medicine to help prevent infection. General instructions Ask your health care provider how your surgical site will be marked or identified. You will have a physical exam. Your health care provider will check the size of the lipoma and whether it can be moved easily. You may have imaging tests, such as: X-rays. CT scan. MRI. Plan to have someone take you home from the hospital or clinic. What happens during the procedure? To reduce your risk of infection: Your health care team will wash or sanitize their hands. Your skin will be washed with soap. You will be given one or more of the following: A medicine to help you relax (sedative). A medicine to numb the area (local anesthetic). A medicine to make you fall asleep (general anesthetic). A medicine that is injected into an area of your body to numb everything below the injection site (regional anesthetic). An incision will be made over the lipoma or very near the lipoma. The incision may be made in a natural skin line or crease. Tissues, nerves, and blood vessels near the lipoma will be moved out of the way. The lipoma and the capsule that surrounds it will be separated from the surrounding tissues. The lipoma will be removed. The incision may be closed with stitches (sutures). A bandage (dressing) will be placed over the incision. What happens after the procedure? Do not drive for 24 hours if you received a sedative. Your blood pressure, heart rate, breathing rate, and blood oxygen level will be monitored until the medicines you were given have worn off.  Surgical [facility] Surgical drains are used to remove extra fluid that normally builds up in a surgical wound after surgery. A surgical drain helps to heal a surgical wound. Different kinds of surgical drains include: Active drains. These drains use suction to pull drainage away from the surgical wound.  Drainage flows through a tube to a container outside of the body. With these drains, you need to keep the bulb or the drainage container flat (compressed) at all times, except while you empty it. Flattening the bulb or container creates suction. Passive drains. These drains allow fluid to drain naturally, by gravity. Drainage flows through a tube to a bandage (dressing) or a container outside of the body. Passive drains do not need to be emptied. A drain is placed during surgery. Right after surgery, drainage is usually bright red and a little thicker than water. The drainage may gradually turn yellow or pink and become thinner. It is likely that your health care provider will remove the drain when the drainage stops or when the amount decreases to 1-2 Tbsp (15-30 mL) during a 24-hour period. Supplies needed: Tape. Germ-free cleaning solution (sterile saline). Cotton swabs. Split gauze drain sponge: 4 x 4 inches (10 x 10 cm). Gauze square: 4 x 4 inches (10 x 10 cm). How to care for your surgical drain Care for your drain as told by your health care provider. This is important to help prevent infection. If your drain is placed at your back, or any other hard-to-reach area, ask another person to assist you in performing the following tasks: General care Keep the skin around the drain dry and covered with a dressing at all times. Check your drain area every day for signs of infection. Check for: Redness, swelling, or pain. Pus or a bad smell. Cloudy drainage. Tenderness or pressure at the drain exit site. Changing the dressing Follow instructions from your health care provider about how to change your dressing. Change your dressing at least once a day. Change it more often if needed to keep the dressing dry. Make sure you: Gather your supplies. Wash your hands with soap and water before you change your dressing. If soap and water are not available, use hand sanitizer. Remove the old dressing.  Avoid using scissors to do that. Wash your hands with soap and water again after removing the old dressing. Use sterile saline to clean your skin around the drain. You may need to use a cotton swab to clean the skin. Place the tube through the slit in a drain sponge. Place the drain sponge so that it covers your wound. Place the gauze square or another drain sponge on top of the drain sponge that is on the wound. Make sure the tube is between those layers. Tape the dressing to your skin. Tape the drainage tube to your skin 1-2 inches (2.5-5 cm) below the place where the tube enters your body. Taping keeps the tube from pulling on any stitches (sutures) that you have. Wash your hands with soap and water. Write down the color of your drainage and how often you change your dressing. How to empty your active drain  Make sure that you have a measuring cup that you can empty your drainage into. Wash your hands with soap and water. If soap and water are not available, use hand sanitizer. Loosen any pins or clips that hold the tube in place. If your health care provider tells you to strip the tube to prevent clots and  tube blockages: Hold the tube at the skin with one hand. Use your other hand to pinch the tubing with your thumb and first finger. Gently move your fingers down the tube while squeezing very lightly. This clears any drainage, clots, or tissue from the tube. You may need to do this several times each day to keep the tube clear. Do not pull on the tube. Open the bulb cap or the drain plug. Do not touch the inside of the cap or the bottom of the plug. Turn the device upside down and gently squeeze. Empty all of the drainage into the measuring cup. Compress the bulb or the container and replace the cap or the plug. To compress the bulb or the container, squeeze it firmly in the middle while you close the cap or plug the container. Write down the amount of drainage that you have in each  24-hour period. If you have less than 2 Tbsp (30 mL) of drainage during 24 hours, contact your health care provider. Flush the drainage down the toilet. Wash your hands with soap and water. Contact a health care provider if: You have redness, swelling, or pain around your drain area. You have pus or a bad smell coming from your drain area. You have a fever or chills. The skin around your drain is warm to the touch. The amount of drainage that you have is increasing instead of decreasing. You have drainage that is cloudy. There is a sudden stop or a sudden decrease in the amount of drainage that you have. Your drain tube falls out. Your active drain does not stay compressed after you empty it. Summary Surgical drains are used to remove extra fluid that normally builds up in a surgical wound after surgery. Different kinds of surgical drains include active drains and passive drains. Active drains use suction to pull drainage away from the surgical wound, and passive drains allow fluid to drain naturally. It is important to care for your drain to prevent infection. If your drain is placed at your back, or any other hard-to-reach area, ask another person to assist you. Contact your health care provider if you have redness, swelling, or pain around your drain area. This information is not intended to replace advice given to you by your health care provider. Make sure you discuss any questions you have with your health care provider. Document Revised: 04/09/2021 Document Reviewed: 03/09/2018 Elsevier Patient Education  [place].

## 2021-10-13 NOTE — Addendum Note (Signed)
Addended by: Riki Sheer on: 10/13/2021 10:52 AM   Modules accepted: Orders

## 2021-10-13 NOTE — Progress Notes (Signed)
10/13/2021  Reason for Visit:  Right upper back mass  Requesting Provider:  Aletha Halim, MD  History of Present Illness: Tina Melton is a 36 y.o. female presenting for evaluation of a right upper back mass.  The patient had surgery for ectopic pregnancy in June 2023 and noticed after that she had a mass in the right upper back, posterior to the right shoulder, and reports that it has been growing in size.  She also reports that it is tender, particularly over the lateral aspect of it.  She had an ultrasound on 09/29/21 which saw a 10.9 x 3.3 x 10.5 cm mass, possibly a lipoma.  Denies any range of motion issues, but the bra strap rubs on the mass which makes it more aggravated.  Denies any redness of the skin or drainage.  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   History of chicken pox    History of chlamydia    History of hemorrhage specific to perinatal period    6-7 mos bleeding x2, heavy bldg with 2nd preg was told baby nicked 2 blood vessels   Obesity    Panic attacks    Pregnancy induced hypertension    Yeast infection      Past Surgical History: Past Surgical History:  Procedure Laterality Date   INDUCED ABORTION     LAPAROSCOPIC UNILATERAL SALPINGECTOMY Left 08/11/2021   Procedure: LAPAROSCOPIC LEFT SALPINGECTOMY WITH REMOVAL OF ECTOPIC PREGNANCY;  Surgeon: Aletha Halim, MD;  Location: [facility];  Service: Gynecology;  Laterality: Left;    Home Medications: Prior to Admission medications   Not on File    Allergies: Allergies  Allergen Reactions   Lemon Juice Anaphylaxis   Lemon Oil Anaphylaxis    Social History:  reports that she has never smoked. She has never used smokeless tobacco. She reports that she does not drink alcohol and does not use drugs.   Family History: Family History  Problem Relation Age of Onset   Mental illness Mother        schizophrenia and panic attacks   Rheum  arthritis Father    Arthritis Father    Birth defects Son        extra digit   Hypertension Maternal Grandmother    Stroke Maternal Grandmother    Dementia Maternal Grandmother    Diabetes Maternal Grandfather    Autoimmune disease Paternal Grandmother    Arthritis Paternal Grandmother    Diabetes Paternal Grandmother     Review of Systems: Review of Systems  Constitutional:  Positive for weight loss (intentional weight loss 130 lbs over 2 years). Negative for chills and fever.  Respiratory:  Negative for shortness of breath.   Cardiovascular:  Negative for chest pain.  Gastrointestinal:  Negative for abdominal pain, nausea and vomiting.  Genitourinary:  Negative for dysuria.  Musculoskeletal:  Negative for myalgias.  Skin:  Negative for rash.       Right upper back mass  Neurological:  Negative for dizziness.  Psychiatric/Behavioral:  Negative for depression.     Physical Exam BP 133/88   Pulse 100   Temp 98 F (36.7 C)   Ht '5\' 11"'$  (1.093 m)   Wt 249 lb (112.9 kg)   LMP 10/04/2021 (Exact Date)   SpO2 100%   BMI 34.73 kg/m  CONSTITUTIONAL: No acute distress HEENT:  Normocephalic, atraumatic, extraocular motion intact. NECK: Trachea is midline, and there is  no jugular venous distension.  RESPIRATORY:  Lungs are clear, and breath sounds are equal bilaterally. Normal respiratory effort without pathologic use of accessory muscles. CARDIOVASCULAR: Heart is regular without murmurs, gallops, or rubs. MUSCULOSKELETAL:  Normal muscle strength and tone in all four extremities.  No peripheral edema or cyanosis. SKIN:  The patient has a 10 cm mass in the right upper back, posterior to the right shoulder, which is soft, mobile, and tender over the lateral aspect of it.  I performed a bedside ultrasound of the mass and it is echogenic rather than cystic in appearance, more consistent with a lipoma.  It does appear to be multilobulated, particularly as it goes lateral.    NEUROLOGIC:   Motor and sensation is grossly normal.  Cranial nerves are grossly intact. PSYCH:  Alert and oriented to person, place and time. Affect is normal.  Laboratory Analysis: Labs on 08/11/21: Na 136, K 3.8, Cl 104, CO2 20, BUN 6, Cr 0.66.  WBC 7.8, Hgb 13.7, Hct 44.2, Plt 346.  Imaging: U/S Chest soft tissue on 09/29/21: IMPRESSION: A 10.9 x 3.3 x 10.5 cm well-circumscribed slightly hypoechoic mass is noted over the right scapular region in the region of clinical concern. Although this may represent a benign lipoma, it does not have an echodense texture typical of a lipoma. Further evaluation of this mass with MRI suggested.    Assessment and Plan: This is a 36 y.o. female with a right upper back lipoma.  --Discussed with the patient that based on the characteristics of the mass on exam and bedside ultrasound, I think this is a lipoma.  Discussed with her that this is a benign mass, and likely it appears that it's grown as the patient has been losing weight.  The mass is causing symptoms of pain, particularly over the lateral aspect of it, and it may be due to mass effect.   --Discussed that this mass can be excised, but given the size, I think it would be best to do this in the operating room under general anesthesia.  Discussed that given the size also, we would likely leave a drain to help with fluid control and the layers to heal together more easily.  Reviewed the surgery at length with her including the incision, risks of bleeding, infection, injury to surrounding structures, that this would be an outpatient procedure, activity restrictions, pain control, and she's willing to proceed. --Will schedule her for surgery on 10/21/21.  I spent 40 minutes dedicated to the care of this patient on the date of this encounter to include pre-visit review of records, face-to-face time with the patient discussing diagnosis and management, and any post-visit coordination of care.   Melvyn Neth,  [facility]

## 2021-10-14 ENCOUNTER — Telehealth: Payer: Self-pay | Admitting: Surgery

## 2021-10-14 NOTE — Telephone Encounter (Signed)
Outgoing call is made,left message for patient to call.  Please inform her of the following regarding scheduled surgery.   Pre-Admission date/time, and Surgery date.  Surgery Date: 10/21/21 Preadmission Testing Date: 10/15/21 (phone 1p-5p)  Also patient to call at [phone], between 1-3:00pm the day before surgery, to find out what time to arrive for surgery.

## 2021-10-14 NOTE — Telephone Encounter (Signed)
Patient calls back, she is informed of all dates regarding her surgery and verbalized understanding. 

## 2021-10-15 ENCOUNTER — Encounter
Admission: RE | Admit: 2021-10-15 | Discharge: 2021-10-15 | Disposition: A | Discharge status: Home / Self Care | Payer: Medicaid Other | Source: Ambulatory Visit | Attending: Surgery | Admitting: Surgery

## 2021-10-15 VITALS — Ht 71.0 in | Wt 245.0 lb

## 2021-10-15 DIAGNOSIS — Z01812 Encounter for preprocedural laboratory examination: Secondary | ICD-10-CM

## 2021-10-15 NOTE — Patient Instructions (Addendum)
Your procedure is scheduled on: Tuesday October 21, 2021. Report to Day Surgery inside [facility] 2nd floor, stop by admissions desk before getting on elevator. To find out your arrival time please call [phone] between 1PM - 3PM on Friday October 17, 2021.  Remember: Instructions that are not followed completely may result in serious medical risk,  up to and including death, or upon the discretion of your surgeon and anesthesiologist your  surgery may need to be rescheduled.     _X__ 1. Do not eat food after midnight the night before your procedure.                 No chewing gum or hard candies. You may drink clear liquids up to 2 hours                 before you are scheduled to arrive for your surgery- DO not drink clear                 liquids within 2 hours of the start of your surgery.                 Clear Liquids include:  water, apple juice without pulp, clear Gatorade, G2 or                  Gatorade Zero (avoid Red/Purple/Blue), Black Coffee or Tea (Do not add                 anything to coffee or tea).  __X__2.  On the morning of surgery brush your teeth with toothpaste and water, you                may rinse your mouth with mouthwash if you wish.  Do not swallow any toothpaste or mouthwash.     _X__ 3.  No Alcohol for 24 hours before or after surgery.   _X__ 4.  Do Not Smoke or use e-cigarettes For 24 Hours Prior to Your Surgery.                 Do not use any chewable tobacco products for at least 6 hours prior to                 Surgery.  _X__  5.  Do not use any recreational drugs (marijuana, cocaine, heroin, ecstasy, MDMA or other)                For at least one week prior to your surgery.  Combination of these drugs with anesthesia                May have life threatening results.  ____  6.  Bring all medications with you on the day of surgery if instructed.   __X__  7.  Notify your doctor if there is any change in your medical  condition      (cold, fever, infections).     Do not wear jewelry, make-up, hairpins, clips or nail polish. Do not wear lotions, powders, or perfumes. You may wear deodorant. Do not shave 48 hours prior to surgery. Men may shave face and neck. Do not bring valuables to the hospital.    [facility] is not responsible for any belongings or valuables.  Contacts, dentures or bridgework may not be worn into surgery. Leave your suitcase in the car. After surgery it may be brought to your room. For patients admitted to the hospital, discharge time is determined  by your treatment team.   Patients discharged the day of surgery will not be allowed to drive home.   Make arrangements for someone to be with you for the first 24 hours of your Same Day Discharge.   __X__ Take these medicines the morning of surgery with A SIP OF WATER:    1. None   2.   3.   4.  5.  6.  ____ Fleet Enema (as directed)   __X__ Use CHG Soap (or Dial Antibacterial Soap) as directed  ____ Use Benzoyl Peroxide Gel as instructed  ____ Use inhalers on the day of surgery  ____ Stop metformin 2 days prior to surgery    ____ Take 1/2 of usual insulin dose the night before surgery. No insulin the morning          of surgery.   ____ Call your PCP, cardiologist, or Pulmonologist if taking Coumadin/Plavix/aspirin and ask when to stop before your surgery.   __X__ One Week prior to surgery- Stop Anti-inflammatories such as Ibuprofen, Aleve, Advil, Motrin, meloxicam (MOBIC), diclofenac, etodolac, ketorolac, Toradol, Daypro, piroxicam, Goody's or BC powders. OK TO USE TYLENOL IF NEEDED   __X__ Do not start any new vitamins and or supplements until after surgery.    ____ Bring C-Pap to the hospital.    If you have any questions regarding your pre-procedure instructions,  Please call Pre-admit Testing at [phone]

## 2021-10-17 ENCOUNTER — Ambulatory Visit (INDEPENDENT_AMBULATORY_CARE_PROVIDER_SITE_OTHER): Payer: Medicaid Other | Admitting: Urology

## 2021-10-17 ENCOUNTER — Encounter: Payer: Self-pay | Admitting: Urology

## 2021-10-17 VITALS — BP 132/85 | HR 74 | Ht 71.0 in | Wt 245.0 lb

## 2021-10-17 DIAGNOSIS — A749 Chlamydial infection, unspecified: Secondary | ICD-10-CM

## 2021-10-17 DIAGNOSIS — R3915 Urgency of urination: Secondary | ICD-10-CM

## 2021-10-17 DIAGNOSIS — R109 Unspecified abdominal pain: Secondary | ICD-10-CM | POA: Insufficient documentation

## 2021-10-17 DIAGNOSIS — N3941 Urge incontinence: Secondary | ICD-10-CM | POA: Diagnosis not present

## 2021-10-17 DIAGNOSIS — R350 Frequency of micturition: Secondary | ICD-10-CM | POA: Diagnosis not present

## 2021-10-17 DIAGNOSIS — B3731 Acute candidiasis of vulva and vagina: Secondary | ICD-10-CM | POA: Insufficient documentation

## 2021-10-17 DIAGNOSIS — Z87448 Personal history of other diseases of urinary system: Secondary | ICD-10-CM | POA: Diagnosis not present

## 2021-10-17 DIAGNOSIS — R310 Gross hematuria: Secondary | ICD-10-CM | POA: Diagnosis not present

## 2021-10-17 DIAGNOSIS — A498 Other bacterial infections of unspecified site: Secondary | ICD-10-CM | POA: Insufficient documentation

## 2021-10-17 DIAGNOSIS — R399 Unspecified symptoms and signs involving the genitourinary system: Secondary | ICD-10-CM

## 2021-10-17 DIAGNOSIS — D649 Anemia, unspecified: Secondary | ICD-10-CM | POA: Insufficient documentation

## 2021-10-17 HISTORY — DX: Chlamydial infection, unspecified: A74.9

## 2021-10-17 LAB — URINALYSIS, COMPLETE
Bilirubin, UA: NEGATIVE
Glucose, UA: NEGATIVE
Ketones, UA: NEGATIVE
Leukocytes,UA: NEGATIVE
Nitrite, UA: NEGATIVE
Protein,UA: NEGATIVE
RBC, UA: NEGATIVE
Specific Gravity, UA: 1.025 (ref 1.005–1.030)
Urobilinogen, Ur: 0.2 mg/dL (ref 0.2–1.0)
pH, UA: 5 (ref 5.0–7.5)

## 2021-10-17 LAB — MICROSCOPIC EXAMINATION

## 2021-10-17 MED ORDER — GEMTESA 75 MG PO TABS: 75.0000 mg | ORAL_TABLET | Freq: Every day | ORAL | 0 refills | Status: DC

## 2021-10-17 MED ORDER — CEFUROXIME AXETIL 250 MG PO TABS: 250.0000 mg | ORAL_TABLET | Freq: Two times a day (BID) | ORAL | 0 refills | Status: AC

## 2021-10-17 NOTE — Progress Notes (Unsigned)
10/17/2021 10:50 AM   Tina Melton Feb 18, 1985 [id number]  Referring provider: Aletha Halim, MD [place],  [place] [address]  Chief Complaint  Patient presents with   Hematuria    New Patient    HPI: Tina Melton is a 36 y.o. female referred for evaluation of hematuria.  Underwent left salpingectomy for a ruptured ectopic pregnancy 08/11/2021 Postop she has experienced urinary frequency, urgency and urge incontinence.  She has noted intermittent small amounts of blood in her urine Preoperative urine culture was positive for E. 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   History of chicken pox    History of chlamydia    History of hemorrhage specific to perinatal period    6-7 mos bleeding x2, heavy bldg with 2nd preg was told baby nicked 2 blood vessels   Obesity    Panic attacks    Pregnancy induced hypertension    Yeast infection     Surgical History: Past Surgical History:  Procedure Laterality Date   INDUCED ABORTION     LAPAROSCOPIC UNILATERAL SALPINGECTOMY Left 08/11/2021   Procedure: LAPAROSCOPIC LEFT SALPINGECTOMY WITH REMOVAL OF ECTOPIC PREGNANCY;  Surgeon: Aletha Halim, MD;  Location: [facility];  Service: Gynecology;  Laterality: Left;    Home Medications:  Allergies as of 10/17/2021       Reactions   Lemon Juice Anaphylaxis   Lemon Oil Anaphylaxis        Medication List        Accurate as of October 17, 2021 10:50 AM. If you have any questions, ask your nurse or doctor.          BACTRIM DS PO Take by mouth.        Allergies:  Allergies  Allergen Reactions   Lemon Juice Anaphylaxis   Lemon Oil  Anaphylaxis    Family History: Family History  Problem Relation Age of Onset   Mental illness Mother        schizophrenia and panic attacks   Rheum arthritis Father    Arthritis Father    Birth defects Son        extra digit   Hypertension Maternal Grandmother    Stroke Maternal Grandmother    Dementia Maternal Grandmother    Diabetes Maternal Grandfather    Autoimmune disease Paternal Grandmother    Arthritis Paternal Grandmother    Diabetes Paternal Grandmother     Social History:  reports that she has never smoked. She has never used smokeless tobacco. She reports that she does not drink alcohol and does not use drugs.   Physical Exam: LMP 10/04/2021 (Exact Date)   Constitutional:  Alert and oriented, No acute distress. HEENT:  AT Respiratory: Normal respiratory effort, no increased work of breathing. Psychiatric: Normal mood and affect.  Laboratory Data:  Urinalysis Dipstick/microscopy negative   Assessment & Plan:   Storage related voiding symptoms occurring after laparoscopic left salpingectomy for ruptured ectopic pregnancy Recent urine culture was positive.  She was treated with nitrofurantoin.  Still having symptoms Urinalysis normal today and urine culture was repeated Rx cefuroxime 200 mg twice daily for 5 days sent to pharmacy to achieve  better tissue penetration Gemtesa samples 75 mg daily x1 month PA follow-up 1 month for symptom check and repeat UA Cystoscopy for persistent symptoms   Abbie Sons, MD  [facility] [address] [place], [place] [address] [phone]

## 2021-10-19 NOTE — Anesthesia Preprocedure Evaluation (Signed)
Anesthesia Evaluation  Patient identified by MRN, date of birth, ID band Patient awake    Reviewed: Allergy & Precautions, NPO status , Patient's Chart, lab work & pertinent test results  History of Anesthesia Complications Negative for: history of anesthetic complications  Airway Mallampati: II  TM Distance: >3 FB Neck ROM: Full    Dental  (+) Teeth Intact, Dental Advisory Given   Pulmonary neg pulmonary ROS,    breath sounds clear to auscultation       Cardiovascular Exercise Tolerance: Good negative cardio ROS   Rhythm:Regular Rate:Normal     Neuro/Psych PSYCHIATRIC DISORDERS Anxiety negative neurological ROS     GI/Hepatic negative GI ROS, Neg liver ROS,   Endo/Other  negative endocrine ROS  Renal/GU negative Renal ROS     Musculoskeletal negative musculoskeletal ROS (+)   Abdominal (+) + obese,   Peds  Hematology   Anesthesia Other Findings Lipoma of right upper back  Reproductive/Obstetrics                            Anesthesia Physical  Anesthesia Plan  ASA: 2  Anesthesia Plan: General   Post-op Pain Management: Regional block*, Gabapentin PO (pre-op)*, Tylenol PO (pre-op)* and Toradol IV (intra-op)*   Induction: Intravenous  PONV Risk Score and Plan: 3 and Ondansetron, Dexamethasone and Midazolam  Airway Management Planned: LMA  Additional Equipment: None  Intra-op Plan:   Post-operative Plan:   Informed Consent: I have reviewed the patients History and Physical, chart, labs and discussed the procedure including the risks, benefits and alternatives for the proposed anesthesia with the patient or authorized representative who has indicated his/her understanding and acceptance.     Dental advisory given  Plan Discussed with: Anesthesiologist and CRNA  Anesthesia Plan Comments:        Anesthesia Quick Evaluation

## 2021-10-20 ENCOUNTER — Encounter: Payer: Self-pay | Admitting: Urology

## 2021-10-21 ENCOUNTER — Encounter
Admission: RE | Disposition: A | Discharge status: Home / Self Care | Payer: Self-pay | Source: Ambulatory Visit | Attending: Surgery

## 2021-10-21 ENCOUNTER — Other Ambulatory Visit: Payer: Self-pay

## 2021-10-21 ENCOUNTER — Ambulatory Visit
Admission: RE | Admit: 2021-10-21 | Discharge: 2021-10-21 | Disposition: A | Discharge status: Home / Self Care | Payer: Medicaid Other | Source: Ambulatory Visit | Attending: Surgery | Admitting: Surgery

## 2021-10-21 ENCOUNTER — Encounter: Payer: Self-pay | Admitting: Surgery

## 2021-10-21 ENCOUNTER — Ambulatory Visit: Payer: Medicaid Other | Admitting: Anesthesiology

## 2021-10-21 DIAGNOSIS — D171 Benign lipomatous neoplasm of skin and subcutaneous tissue of trunk: Secondary | ICD-10-CM | POA: Insufficient documentation

## 2021-10-21 DIAGNOSIS — Z6834 Body mass index (BMI) 34.0-34.9, adult: Secondary | ICD-10-CM | POA: Insufficient documentation

## 2021-10-21 DIAGNOSIS — Z01812 Encounter for preprocedural laboratory examination: Secondary | ICD-10-CM

## 2021-10-21 DIAGNOSIS — E669 Obesity, unspecified: Secondary | ICD-10-CM | POA: Diagnosis not present

## 2021-10-21 DIAGNOSIS — D1739 Benign lipomatous neoplasm of skin and subcutaneous tissue of other sites: Secondary | ICD-10-CM | POA: Diagnosis not present

## 2021-10-21 HISTORY — PX: LIPOMA EXCISION: SHX5283

## 2021-10-21 LAB — POCT PREGNANCY, URINE: Preg Test, Ur: NEGATIVE

## 2021-10-21 LAB — CULTURE, URINE COMPREHENSIVE

## 2021-10-21 SURGERY — EXCISION LIPOMA
Anesthesia: General | Site: Back | Laterality: Right

## 2021-10-21 MED ORDER — BUPIVACAINE-EPINEPHRINE 0.5% -1:200000 IJ SOLN
INTRAMUSCULAR | Status: DC | PRN
  Administered 2021-10-21: 50 mL via INTRAMUSCULAR

## 2021-10-21 MED ORDER — BUPIVACAINE LIPOSOME 1.3 % IJ SUSP: 20.0000 mL | Freq: Once | INTRAMUSCULAR | Status: DC

## 2021-10-21 MED ORDER — LACTATED RINGERS IV SOLN: INTRAVENOUS | Status: DC

## 2021-10-21 MED ORDER — OXYCODONE HCL 5 MG/5ML PO SOLN: 5.0000 mg | Freq: Once | ORAL | Status: AC | PRN

## 2021-10-21 MED ORDER — FENTANYL CITRATE (PF) 100 MCG/2ML IJ SOLN: 25.0000 ug | INTRAMUSCULAR | Status: DC | PRN

## 2021-10-21 MED ORDER — CEFAZOLIN SODIUM-DEXTROSE 2-4 GM/100ML-% IV SOLN
2.0000 g | INTRAVENOUS | Status: AC
  Administered 2021-10-21: 2 g via INTRAVENOUS

## 2021-10-21 MED ORDER — KETOROLAC TROMETHAMINE 30 MG/ML IJ SOLN
INTRAMUSCULAR | Status: DC | PRN
  Administered 2021-10-21: 30 mg via INTRAVENOUS

## 2021-10-21 MED ORDER — PROPOFOL 10 MG/ML IV BOLUS
INTRAVENOUS | Status: AC
  Filled 2021-10-21: qty 20

## 2021-10-21 MED ORDER — PROMETHAZINE HCL 25 MG/ML IJ SOLN: 6.2500 mg | INTRAMUSCULAR | Status: DC | PRN

## 2021-10-21 MED ORDER — MIDAZOLAM HCL 2 MG/2ML IJ SOLN
INTRAMUSCULAR | Status: DC | PRN
  Administered 2021-10-21: 2 mg via INTRAVENOUS

## 2021-10-21 MED ORDER — ACETAMINOPHEN 500 MG PO TABS
1000.0000 mg | ORAL_TABLET | ORAL | Status: AC
  Administered 2021-10-21: 1000 mg via ORAL

## 2021-10-21 MED ORDER — BUPIVACAINE-EPINEPHRINE (PF) 0.5% -1:200000 IJ SOLN
INTRAMUSCULAR | Status: AC
  Filled 2021-10-21: qty 30

## 2021-10-21 MED ORDER — CHLORHEXIDINE GLUCONATE CLOTH 2 % EX PADS
6.0000 | MEDICATED_PAD | Freq: Once | CUTANEOUS | Status: AC
  Administered 2021-10-21: 6 via TOPICAL

## 2021-10-21 MED ORDER — GABAPENTIN 300 MG PO CAPS
300.0000 mg | ORAL_CAPSULE | ORAL | Status: AC
  Administered 2021-10-21: 300 mg via ORAL

## 2021-10-21 MED ORDER — BUPIVACAINE LIPOSOME 1.3 % IJ SUSP
INTRAMUSCULAR | Status: AC
  Filled 2021-10-21: qty 20

## 2021-10-21 MED ORDER — ACETAMINOPHEN 10 MG/ML IV SOLN: 1000.0000 mg | Freq: Once | INTRAVENOUS | Status: DC | PRN

## 2021-10-21 MED ORDER — LIDOCAINE HCL (CARDIAC) PF 100 MG/5ML IV SOSY
PREFILLED_SYRINGE | INTRAVENOUS | Status: DC | PRN
  Administered 2021-10-21: 100 mg via INTRAVENOUS

## 2021-10-21 MED ORDER — LIDOCAINE HCL (PF) 2 % IJ SOLN
INTRAMUSCULAR | Status: AC
  Filled 2021-10-21: qty 5

## 2021-10-21 MED ORDER — CEFAZOLIN SODIUM-DEXTROSE 2-4 GM/100ML-% IV SOLN
INTRAVENOUS | Status: AC
  Filled 2021-10-21: qty 100

## 2021-10-21 MED ORDER — PHENYLEPHRINE 80 MCG/ML (10ML) SYRINGE FOR IV PUSH (FOR BLOOD PRESSURE SUPPORT)
PREFILLED_SYRINGE | INTRAVENOUS | Status: DC | PRN
  Administered 2021-10-21 (×4): 80 ug via INTRAVENOUS

## 2021-10-21 MED ORDER — MIDAZOLAM HCL 2 MG/2ML IJ SOLN
INTRAMUSCULAR | Status: AC
Start: 2021-10-21 — End: ?
  Filled 2021-10-21: qty 2

## 2021-10-21 MED ORDER — ONDANSETRON HCL 4 MG/2ML IJ SOLN
INTRAMUSCULAR | Status: DC | PRN
  Administered 2021-10-21: 4 mg via INTRAVENOUS

## 2021-10-21 MED ORDER — BUPIVACAINE HCL (PF) 0.5 % IJ SOLN
INTRAMUSCULAR | Status: AC
  Filled 2021-10-21: qty 30

## 2021-10-21 MED ORDER — ORAL CARE MOUTH RINSE: 15.0000 mL | Freq: Once | OROMUCOSAL | Status: AC

## 2021-10-21 MED ORDER — CHLORHEXIDINE GLUCONATE CLOTH 2 % EX PADS: 6.0000 | MEDICATED_PAD | Freq: Once | CUTANEOUS | Status: DC

## 2021-10-21 MED ORDER — FENTANYL CITRATE (PF) 100 MCG/2ML IJ SOLN
INTRAMUSCULAR | Status: AC
  Filled 2021-10-21: qty 2

## 2021-10-21 MED ORDER — CHLORHEXIDINE GLUCONATE 0.12 % MT SOLN
15.0000 mL | Freq: Once | OROMUCOSAL | Status: AC
  Administered 2021-10-21: 15 mL via OROMUCOSAL

## 2021-10-21 MED ORDER — DROPERIDOL 2.5 MG/ML IJ SOLN: 0.6250 mg | Freq: Once | INTRAMUSCULAR | Status: DC | PRN

## 2021-10-21 MED ORDER — CHLORHEXIDINE GLUCONATE 0.12 % MT SOLN
OROMUCOSAL | Status: AC
  Filled 2021-10-21: qty 15

## 2021-10-21 MED ORDER — OXYCODONE HCL 5 MG PO TABS
5.0000 mg | ORAL_TABLET | Freq: Once | ORAL | Status: AC | PRN
  Administered 2021-10-21: 5 mg via ORAL

## 2021-10-21 MED ORDER — ACETAMINOPHEN 500 MG PO TABS: 1000.0000 mg | ORAL_TABLET | Freq: Four times a day (QID) | ORAL | Status: DC | PRN

## 2021-10-21 MED ORDER — OXYCODONE HCL 5 MG PO TABS
ORAL_TABLET | ORAL | Status: AC
  Filled 2021-10-21: qty 1

## 2021-10-21 MED ORDER — IBUPROFEN 600 MG PO TABS: 600.0000 mg | ORAL_TABLET | Freq: Three times a day (TID) | ORAL | 1 refills | Status: DC | PRN

## 2021-10-21 MED ORDER — GABAPENTIN 300 MG PO CAPS
ORAL_CAPSULE | ORAL | Status: AC
  Filled 2021-10-21: qty 1

## 2021-10-21 MED ORDER — PROPOFOL 10 MG/ML IV BOLUS
INTRAVENOUS | Status: DC | PRN
  Administered 2021-10-21: 200 mg via INTRAVENOUS

## 2021-10-21 MED ORDER — DEXAMETHASONE SODIUM PHOSPHATE 10 MG/ML IJ SOLN
INTRAMUSCULAR | Status: DC | PRN
  Administered 2021-10-21: 10 mg via INTRAVENOUS

## 2021-10-21 MED ORDER — ROCURONIUM BROMIDE 10 MG/ML (PF) SYRINGE
PREFILLED_SYRINGE | INTRAVENOUS | Status: AC
  Filled 2021-10-21: qty 10

## 2021-10-21 MED ORDER — ACETAMINOPHEN 500 MG PO TABS
ORAL_TABLET | ORAL | Status: AC
  Filled 2021-10-21: qty 2

## 2021-10-21 MED ORDER — OXYCODONE HCL 5 MG PO TABS: 5.0000 mg | ORAL_TABLET | ORAL | 0 refills | Status: DC | PRN

## 2021-10-21 MED ORDER — FENTANYL CITRATE (PF) 100 MCG/2ML IJ SOLN
INTRAMUSCULAR | Status: DC | PRN
  Administered 2021-10-21 (×2): 50 ug via INTRAVENOUS

## 2021-10-21 MED ORDER — BUPIVACAINE LIPOSOME 1.3 % IJ SUSP
INTRAMUSCULAR | Status: AC
  Filled 2021-10-21: qty 10

## 2021-10-21 SURGICAL SUPPLY — 42 items
ADH SKN CLS APL DERMABOND .7 (GAUZE/BANDAGES/DRESSINGS) ×1
APL PRP STRL LF DISP 70% ISPRP (MISCELLANEOUS) ×1
BULB RESERV EVAC DRAIN JP 100C (MISCELLANEOUS) IMPLANT
CHLORAPREP W/TINT 26 (MISCELLANEOUS) ×1 IMPLANT
DERMABOND ADVANCED (GAUZE/BANDAGES/DRESSINGS) ×1
DERMABOND ADVANCED .7 DNX12 (GAUZE/BANDAGES/DRESSINGS) ×1 IMPLANT
DRAIN JP 15F RND TROCAR (DRAIN) IMPLANT
DRAPE 3/4 80X56 (DRAPES) ×2 IMPLANT
DRAPE LAPAROTOMY 100X77 ABD (DRAPES) ×1 IMPLANT
DRSG TEGADERM 4X4.75 (GAUZE/BANDAGES/DRESSINGS) IMPLANT
ELECT CAUTERY BLADE TIP 2.5 (TIP) ×1
ELECT REM PT RETURN 9FT ADLT (ELECTROSURGICAL) ×1
ELECTRODE CAUTERY BLDE TIP 2.5 (TIP) ×1 IMPLANT
ELECTRODE REM PT RTRN 9FT ADLT (ELECTROSURGICAL) ×1 IMPLANT
GAUZE 4X4 16PLY ~~LOC~~+RFID DBL (SPONGE) ×1 IMPLANT
GAUZE SPONGE 4X4 12PLY STRL (GAUZE/BANDAGES/DRESSINGS) IMPLANT
GLOVE SURG SYN 7.0 (GLOVE) ×1 IMPLANT
GLOVE SURG SYN 7.0 PF PI (GLOVE) ×1 IMPLANT
GLOVE SURG SYN 7.5  E (GLOVE) ×1
GLOVE SURG SYN 7.5 E (GLOVE) ×1 IMPLANT
GLOVE SURG SYN 7.5 PF PI (GLOVE) ×1 IMPLANT
GOWN STRL REUS W/ TWL LRG LVL3 (GOWN DISPOSABLE) ×2 IMPLANT
GOWN STRL REUS W/TWL LRG LVL3 (GOWN DISPOSABLE) ×2
KIT TURNOVER KIT A (KITS) ×1 IMPLANT
LABEL OR SOLS (LABEL) ×1 IMPLANT
MANIFOLD NEPTUNE II (INSTRUMENTS) ×1 IMPLANT
NEEDLE HYPO 22GX1.5 SAFETY (NEEDLE) ×1 IMPLANT
NS IRRIG 1000ML POUR BTL (IV SOLUTION) ×1 IMPLANT
PACK BASIN MINOR ARMC (MISCELLANEOUS) ×1 IMPLANT
SUT ETHILON 3-0 FS-10 30 BLK (SUTURE) ×1
SUT MNCRL 4-0 (SUTURE) ×1
SUT MNCRL 4-0 27XMFL (SUTURE) ×1
SUT VIC AB 0 SH 27 (SUTURE) ×2 IMPLANT
SUT VIC AB 2-0 SH 27 (SUTURE) ×2
SUT VIC AB 2-0 SH 27XBRD (SUTURE) IMPLANT
SUT VIC AB 3-0 SH 27 (SUTURE) ×3
SUT VIC AB 3-0 SH 27X BRD (SUTURE) ×2 IMPLANT
SUTURE EHLN 3-0 FS-10 30 BLK (SUTURE) IMPLANT
SUTURE MNCRL 4-0 27XMF (SUTURE) ×1 IMPLANT
SYR 30ML LL (SYRINGE) ×1 IMPLANT
TRAP FLUID SMOKE EVACUATOR (MISCELLANEOUS) ×1 IMPLANT
WATER STERILE IRR 500ML POUR (IV SOLUTION) ×1 IMPLANT

## 2021-10-21 NOTE — Op Note (Signed)
  Procedure Date:  10/21/2021  Pre-operative Diagnosis:  Right upper back lipoma  Post-operative Diagnosis: Right upper back lipoma, 12 x 12 cm  Procedure:  Excision of right upper back lipoma  Surgeon:  Melvyn Neth, MD  Anesthesia:  General endotracheal  Estimated Blood Loss:  10 ml  Specimens:  Right upper back lipoma  Complications:  None  Indications for Procedure:  This is a 36 y.o. female with diagnosis of a symptomatic large right upper back lipoma.  The patient wishes to have this excised. The risks of bleeding, abscess or infection, injury to surrounding structures, and need for further procedures were all discussed with the patient and she was willing to proceed.  Description of Procedure: The patient was correctly identified in the preoperative area and brought into the operating room.  The patient was placed supine with VTE prophylaxis in place.  Appropriate time-outs were performed.  Anesthesia was induced and the patient was intubated.  Appropriate antibiotics were infused.  The patient was placed in left lateral decubitus position.  The patient's right upper back was prepped and draped in usual sterile fashion.  A 11 cm incision was made over the lipoma, and cautery was used to dissect down the subcutaneous tissue to the lipoma itself.  Skin flaps were created using cautery as well, and then the lipoma was excised using cautery, intact.  It was a large lipoma.  It was sent off to pathology.  The cavity was then irrigated and hemostasis was assured with cautery.  Local anesthetic was infused intradermally.  A 15 Fr. Blake drain was placed into the wound cavity coming from the anterior/lateral portion of the incision.  The skin flaps were then approximated to the wound bed using multiple 2-0 Vicryl sutures in order to decrease the amount of dead space in the wound.  Careful attention was given to not suture the drain in place.  The wound edges were then closed in three layers  using 2-0 Vicryl, 3-0 Vicryl and 4-0 Monocryl.  The drain was secured using 3-0 Nylon suture.  The incision was cleaned and sealed with DermaBond.  The drain was dressed with 4x4 gauze and TegaDerm.  The patient was then emerged from anesthesia, extubated, and brought to the recovery room for further management.    The patient tolerated the procedure well and all counts were correct at the end of the case.   Melvyn Neth, MD

## 2021-10-21 NOTE — Transfer of Care (Signed)
Immediate Anesthesia Transfer of Care Note  Patient: Tina Melton  Procedure(s) Performed: EXCISION LIPOMA (Right: Back)  Patient Location: PACU  Anesthesia Type:General  Level of Consciousness: awake, alert  and oriented  Airway & Oxygen Therapy: Patient Spontanous Breathing and Patient connected to face mask oxygen  Post-op Assessment: Report given to RN and Post -op Vital signs reviewed and stable  Post vital signs: Reviewed and stable  Last Vitals:  Vitals Value Taken Time  BP 114/92 10/21/21 1115  Temp    Pulse 57 10/21/21 1118  Resp 17 10/21/21 1118  SpO2 100 % 10/21/21 1118  Vitals shown include unvalidated device data.  Last Pain:  Vitals:   10/21/21 0821  TempSrc: Oral  PainSc: 0-No pain         Complications: No notable events documented.

## 2021-10-21 NOTE — Anesthesia Procedure Notes (Signed)
Procedure Name: LMA Insertion Date/Time: 10/21/2021 9:20 AM  Performed by: Cammie Sickle, CRNAPre-anesthesia Checklist: Patient identified, Patient being monitored, Timeout performed, Emergency Drugs available and Suction available Patient Re-evaluated:Patient Re-evaluated prior to induction Oxygen Delivery Method: Circle system utilized Preoxygenation: Pre-oxygenation with 100% oxygen Induction Type: IV induction Ventilation: Mask ventilation without difficulty LMA: LMA inserted LMA Size: 4.0 Tube type: Oral Number of attempts: 1 Placement Confirmation: positive ETCO2 and breath sounds checked- equal and bilateral Tube secured with: Tape Dental Injury: Teeth and Oropharynx as per pre-operative assessment

## 2021-10-21 NOTE — Interval H&P Note (Signed)
History and Physical Interval Note:  10/21/2021 9:03 AM  Tina Melton  has presented today for surgery, with the diagnosis of Lipoma of right upper back.  The various methods of treatment have been discussed with the patient and family. After consideration of risks, benefits and other options for treatment, the patient has consented to  Procedure(s): EXCISION LIPOMA (Right) as a surgical intervention.  The patient's history has been reviewed, patient examined, no change in status, stable for surgery.  I have reviewed the patient's chart and labs.  Questions were answered to the patient's satisfaction.     Tina Melton

## 2021-10-21 NOTE — Discharge Instructions (Signed)
AMBULATORY SURGERY  ?DISCHARGE INSTRUCTIONS ? ? ?The drugs that you were given will stay in your system until tomorrow so for the next 24 hours you should not: ? ?Drive an automobile ?Make any legal decisions ?Drink any alcoholic beverage ? ? ?You may resume regular meals tomorrow.  Today it is better to start with liquids and gradually work up to solid foods. ? ?You may eat anything you prefer, but it is better to start with liquids, then soup and crackers, and gradually work up to solid foods. ? ? ?Please notify your doctor immediately if you have any unusual bleeding, trouble breathing, redness and pain at the surgery site, drainage, fever, or pain not relieved by medication. ? ? ? ?Additional Instructions: ? ? ? ?Please contact your physician with any problems or Same Day Surgery at [phone], Monday through Friday 6 am to 4 pm, or [place] [facility] [place] number at [phone].  ?

## 2021-10-22 ENCOUNTER — Encounter: Payer: Self-pay | Admitting: Surgery

## 2021-10-22 LAB — SURGICAL PATHOLOGY

## 2021-10-22 NOTE — Anesthesia Postprocedure Evaluation (Signed)
Anesthesia Post Note  Patient: Tina Melton  Procedure(s) Performed: EXCISION LIPOMA (Right: Back)  Patient location during evaluation: PACU Anesthesia Type: General Level of consciousness: awake and alert Pain management: pain level controlled Vital Signs Assessment: post-procedure vital signs reviewed and stable Respiratory status: spontaneous breathing, nonlabored ventilation and respiratory function stable Cardiovascular status: blood pressure returned to baseline and stable Postop Assessment: no apparent nausea or vomiting Anesthetic complications: no   No notable events documented.   Last Vitals:  Vitals:   10/21/21 1145 10/21/21 1207  BP: (!) 143/82 (!) 158/93  Pulse: 65 78  Resp: 15 15  Temp: 36.6 C (!) 36.3 C  SpO2: 100% 100%    Last Pain:  Vitals:   10/22/21 0810  TempSrc:   PainSc: [place]

## 2021-10-24 ENCOUNTER — Encounter: Payer: Self-pay | Admitting: Urology

## 2021-10-28 ENCOUNTER — Ambulatory Visit (INDEPENDENT_AMBULATORY_CARE_PROVIDER_SITE_OTHER): Payer: Medicaid Other | Admitting: Physician Assistant

## 2021-10-28 ENCOUNTER — Encounter: Payer: Self-pay | Admitting: Physician Assistant

## 2021-10-28 VITALS — BP 124/81 | HR 80 | Temp 98.4°F | Wt 254.0 lb

## 2021-10-28 DIAGNOSIS — Z09 Encounter for follow-up examination after completed treatment for conditions other than malignant neoplasm: Secondary | ICD-10-CM

## 2021-10-28 DIAGNOSIS — D171 Benign lipomatous neoplasm of skin and subcutaneous tissue of trunk: Secondary | ICD-10-CM

## 2021-10-28 NOTE — Patient Instructions (Addendum)
If you have any concerns or questions, please feel free to call our office. See follow up appointment below.  Excision of Skin Lesions, Care After The following information offers guidance on how to care for yourself after your procedure. Your health care provider may also give you more specific instructions. If you have problems or questions, contact your health care provider. What can I expect after the procedure? After your procedure, it is common to have: Soreness or mild pain. Some redness and swelling. Follow these instructions at home: Excision site care  Follow instructions from your health care provider about how to take care of your excision site. Make sure you: Wash your hands with soap and water for at least 20 seconds before and after you change your bandage (dressing). If soap and water are not available, use hand sanitizer. Change your dressing as told by your health care provider. Leave stitches (sutures), skin glue, or adhesive strips in place. These skin closures may need to stay in place for 2 weeks or longer. If adhesive strip edges start to loosen and curl up, you may trim the loose edges. Do not remove adhesive strips completely unless your health care provider tells you to do that. Check the excision area every day for signs of infection. Watch for: More redness, swelling, or pain. Fluid or blood. Warmth. Pus or a bad smell. Keep the site clean, dry, and protected for at least 48 hours. For bleeding, apply gentle but firm pressure to the area using a folded towel for 20 minutes. Do not take baths, swim, or use a hot tub until your health care provider approves. Ask your health care provider if you may take showers. You may only be allowed to take sponge baths. General instructions Take over-the-counter and prescription medicines only as told by your health care provider. Follow instructions from your health care provider about how to minimize scarring. Scarring should  lessen over time. Avoid sun exposure until the area has healed. Use sunscreen to protect the area from the sun after it has healed. Avoid high-impact exercise and activities until the sutures are removed or the area heals. Keep all follow-up visits. This is important. Contact a health care provider if: You have more redness, swelling, or pain around your excision site. You have fluid or blood coming from your excision site. Your excision site feels warm to the touch. You have pus or a bad smell coming from your excision site. You have a fever. You have pain that does not improve in 2-3 days after your procedure. Get help right away if: You have bleeding that does not stop with pressure or a dressing. Your wound opens up. Summary Take over-the-counter and prescription medicines only as told by your health care provider. Change your dressing as told by your health care provider. Contact a health care provider if you have redness, swelling, pain, or other signs of infection around your excision site. Keep all follow-up visits. This is important. This information is not intended to replace advice given to you by your health care provider. Make sure you discuss any questions you have with your health care provider. Document Revised: 09/03/2020 Document Reviewed: 09/03/2020 Elsevier Patient Education  2023 Elsevier Inc.  

## 2021-10-28 NOTE — Progress Notes (Signed)
[facility] POST-OP OFFICE VISIT  10/28/2021  HPI: Tina Melton is a 36 y.o. female 7 days s/p excision of right upper back lipoma (12 x 12 cm) with Dr Hampton Abbot   She is doing well Soreness is worse in the morning and tends to get better as the day progresses; using pain medications sparingly No fever, chills Incision is doing well; some bruising Drain output is serosanguinous; ~5 ccs daily  Vital signs: BP 124/81   Pulse 80   Temp 98.4 F (36.9 C) (Oral)   Wt 254 lb (115.2 kg)   LMP 10/04/2021 (Exact Date)   SpO2 100%   BMI 35.43 kg/m    Physical Exam: Constitutional: Well appearing female, NAD Skin: 11 cm incision to the right upper back is healing well with dermabond, some ecchymosis, no drainage. Drain is present lateral to this; output serosanguinous (removed)  Assessment/Plan: This is a 36 y.o. female 7 days s/p excision of right upper back lipoma (12 x 12 cm) with Dr Hampton Abbot    - Drain removed; occlusive dressing placed  - Pain control prn  - Reviewed wound care recommendation  - Reviewed lifting restrictions; likely 1-2 more weeks  - Reviewed surgical pathology; Benign Lipoma  - I will see her again in 10-14 days to ensure she continues to heal well; She understands to call with questions/concerns  -- Edison Simon, PA-C [facility] 10/28/2021, 2:33 PM M-F: 7am - 4pm

## 2021-10-29 ENCOUNTER — Telehealth: Payer: Self-pay | Admitting: *Deleted

## 2021-10-29 MED ORDER — TROSPIUM CHLORIDE 20 MG PO TABS: 20.0000 mg | ORAL_TABLET | Freq: Two times a day (BID) | ORAL | 1 refills | Status: DC

## 2021-10-29 NOTE — Telephone Encounter (Signed)
Patient Tina Melton is breaking her out . Advised her to stop taking the medication . Trospium sent in per [facility]

## 2021-11-11 ENCOUNTER — Ambulatory Visit (INDEPENDENT_AMBULATORY_CARE_PROVIDER_SITE_OTHER): Payer: Medicaid Other | Admitting: Physician Assistant

## 2021-11-11 ENCOUNTER — Encounter: Payer: Self-pay | Admitting: Physician Assistant

## 2021-11-11 ENCOUNTER — Other Ambulatory Visit: Payer: Self-pay

## 2021-11-11 VITALS — BP 139/89 | HR 67 | Temp 98.4°F | Ht 71.0 in | Wt 254.0 lb

## 2021-11-11 DIAGNOSIS — D171 Benign lipomatous neoplasm of skin and subcutaneous tissue of trunk: Secondary | ICD-10-CM

## 2021-11-11 DIAGNOSIS — Z09 Encounter for follow-up examination after completed treatment for conditions other than malignant neoplasm: Secondary | ICD-10-CM

## 2021-11-11 NOTE — Progress Notes (Signed)
[facility] POST-OP OFFICE VISIT  11/11/2021  HPI: Tina Melton is a 36 y.o. female 21 days s/p excision of right upper back lipoma (12 x 12 cm) with Dr Hampton Abbot   She is doing better Still with intermittent pains; worse with increased activity, wearing bra for prolonged time She primarily leaves this open to air but will cover with dry gauze when going out No fever, chills, or drainage  No new complaints   Vital signs: BP 139/89   Pulse 67   Temp 98.4 F (36.9 C) (Oral)   Ht '5\' 11"'$  (1.803 m)   Wt 254 lb (115.2 kg)   LMP 10/04/2021 (Exact Date)   SpO2 100%   BMI 35.43 kg/m    Physical Exam: Constitutional: Well appearing female, NAD Skin: 11 cm incision to the right upper back is healing well, dermabond flaking off appropriately, there is expected degrees of induration, I do not appreciate any erythema, no fluctuance, no evidence of dehiscence   Assessment/Plan: This is a 36 y.o. female 21 days s/p excision of right upper back lipoma (12 x 12 cm) with Dr Hampton Abbot    - Reviewed wound care recommendation  - Reviewed lifting/activity restriction: Should be reasonable to slowly resume activity next week   - She can follow up on as needed basis; She understands to call with questions/concerns  -- Edison Simon, PA-C [facility] 11/11/2021, 2:53 PM M-F: 7am - 4pm

## 2021-11-11 NOTE — Patient Instructions (Signed)
Please call if you have questions or concerns.  

## 2021-11-17 ENCOUNTER — Ambulatory Visit: Payer: Medicaid Other | Admitting: Physician Assistant

## 2021-11-24 ENCOUNTER — Ambulatory Visit (INDEPENDENT_AMBULATORY_CARE_PROVIDER_SITE_OTHER): Payer: Medicaid Other | Admitting: Physician Assistant

## 2021-11-24 VITALS — BP 130/79 | HR 70 | Ht 71.0 in | Wt 254.0 lb

## 2021-11-24 DIAGNOSIS — R3915 Urgency of urination: Secondary | ICD-10-CM

## 2021-11-24 MED ORDER — SOLIFENACIN SUCCINATE 5 MG PO TABS: 5.0000 mg | ORAL_TABLET | Freq: Every day | ORAL | 1 refills | Status: DC

## 2021-11-24 NOTE — Progress Notes (Unsigned)
11/24/2021 1:10 PM   Tina Melton 1985/11/28 [id number]  CC: Chief Complaint  Patient presents with   Hematuria   HPI: Tina Melton is a 36 y.o. female with OAB wet since undergoing left salpingectomy for ruptured ectopic pregnancy in June 2023 who presents today for symptom recheck on Gemtesa.  She called clinic after starting Gemtesa to report rash and was counseled to stop the medication.  She has been off it since.  Her symptoms are stable.  Dr. Bernardo Heater had prescribed trospium as an alternative, however she had difficulty getting this filled at the pharmacy  In-office UA today positive for trace leukocytes; urine microscopy with 6-10 WBCs/HPF, >10 epithelial cells/HPF, and moderate bacteria.   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   History of chicken pox    History of chlamydia    History of hemorrhage specific to perinatal period    6-7 mos bleeding x2, heavy bldg with 2nd preg was told baby nicked 2 blood vessels   Obesity    Panic attacks    Pregnancy induced hypertension    Yeast infection     Surgical History: Past Surgical History:  Procedure Laterality Date   INDUCED ABORTION     LAPAROSCOPIC UNILATERAL SALPINGECTOMY Left 08/11/2021   Procedure: LAPAROSCOPIC LEFT SALPINGECTOMY WITH REMOVAL OF ECTOPIC PREGNANCY;  Surgeon: Aletha Halim, MD;  Location: [place];  Service: Gynecology;  Laterality: Left;   LIPOMA EXCISION Right 10/21/2021   Procedure: EXCISION LIPOMA;  Surgeon: Olean Ree, MD;  Location: [facility];  Service: General;  Laterality: Right;    Home Medications:  Allergies as of 11/24/2021       Reactions   Lemon Juice Anaphylaxis   Lemon Oil Anaphylaxis   Gemtesa [vibegron] Rash        Medication List        Accurate as of November 24, 2021  1:10 PM. If you have any questions, ask your nurse or doctor.          acetaminophen 500 MG tablet Commonly known as: TYLENOL Take 2  tablets (1,000 mg total) by mouth every 6 (six) hours as needed for mild pain.   solifenacin 5 MG tablet Commonly known as: VESICARE Take 1 tablet (5 mg total) by mouth daily.        Allergies:  Allergies  Allergen Reactions   Lemon Juice Anaphylaxis   Lemon Oil Anaphylaxis   Gemtesa [Vibegron] Rash    Family History: Family History  Problem Relation Age of Onset   Mental illness Mother        schizophrenia and panic attacks   Rheum arthritis Father    Arthritis Father    Hypertension Maternal Grandmother    Stroke Maternal Grandmother    Dementia Maternal Grandmother    Diabetes Maternal Grandfather    Autoimmune disease Paternal Grandmother    Arthritis Paternal Grandmother    Diabetes Paternal Grandmother    Birth defects Son        extra digit   Prostate cancer Neg Hx    Bladder Cancer Neg Hx    Kidney cancer Neg Hx     Social History:   reports that she has never smoked. She has never used smokeless tobacco. She reports that she does not currently use drugs. She reports that she does not drink alcohol.  Physical Exam: BP 130/79   Pulse 70   Ht '5\' 11"'$  (1.803  m)   Wt 254 lb (115.2 kg)   BMI 35.43 kg/m   Constitutional:  Alert and oriented, no acute distress, nontoxic appearing HEENT: [place], AT Cardiovascular: No clubbing, cyanosis, or edema Respiratory: Normal respiratory effort, no increased work of breathing Skin: No rashes, bruises or suspicious lesions Neurologic: Grossly intact, no focal deficits, moving all 4 extremities Psychiatric: Normal mood and affect  Laboratory Data: Results for orders placed or performed in visit on 11/24/21  Microscopic Examination   Urine  Result Value Ref Range   WBC, UA 6-10 (A) 0 - 5 /hpf   RBC, Urine 0-2 0 - 2 /hpf   Epithelial Cells (non renal) >10 (A) 0 - 10 /hpf   Bacteria, UA Moderate (A) None seen/Few  Urinalysis, Complete  Result Value Ref Range   Specific Gravity, UA 1.025 1.005 - 1.030   pH, UA 5.0 5.0 -  7.5   Color, UA Yellow Yellow   Appearance Ur Clear Clear   Leukocytes,UA Trace (A) Negative   Protein,UA Negative Negative/Trace   Glucose, UA Negative Negative   Ketones, UA Negative Negative   RBC, UA Negative Negative   Bilirubin, UA Negative Negative   Urobilinogen, Ur 0.2 0.2 - 1.0 mg/dL   Nitrite, UA Negative Negative   Microscopic Examination See below:    Assessment & Plan:   1. Urinary urgency She developed a rash on Gemtesa.  I have added it to her allergy list.  I sent in a new prescription for Vesicare and will plan for symptom recheck and PVR next month.  If her symptoms have not improved at that point, recommend cystoscopy.  She is in agreement with this plan. - Urinalysis, Complete - solifenacin (VESICARE) 5 MG tablet; Take 1 tablet (5 mg total) by mouth daily.  Dispense: 30 tablet; Refill: 1  Return in about 4 weeks (around 12/22/2021) for Symptom recheck with PVR.  Debroah Loop, PA-C  [facility] [address], [place], [place] [address] [phone]

## 2021-11-25 LAB — URINALYSIS, COMPLETE
Bilirubin, UA: NEGATIVE
Glucose, UA: NEGATIVE
Ketones, UA: NEGATIVE
Nitrite, UA: NEGATIVE
Protein,UA: NEGATIVE
RBC, UA: NEGATIVE
Specific Gravity, UA: 1.025 (ref 1.005–1.030)
Urobilinogen, Ur: 0.2 mg/dL (ref 0.2–1.0)
pH, UA: 5 (ref 5.0–7.5)

## 2021-11-25 LAB — MICROSCOPIC EXAMINATION: Epithelial Cells (non renal): >10 /hpf — AB (ref 0–10)

## 2021-12-02 ENCOUNTER — Other Ambulatory Visit: Payer: Self-pay | Admitting: *Deleted

## 2021-12-02 ENCOUNTER — Other Ambulatory Visit: Payer: Medicaid Other

## 2021-12-02 DIAGNOSIS — N912 Amenorrhea, unspecified: Secondary | ICD-10-CM | POA: Diagnosis not present

## 2021-12-03 LAB — BETA HCG QUANT (REF LAB): hCG Quant: 3362 m[IU]/mL

## 2021-12-04 ENCOUNTER — Other Ambulatory Visit: Payer: Medicaid Other

## 2021-12-04 DIAGNOSIS — N912 Amenorrhea, unspecified: Secondary | ICD-10-CM | POA: Diagnosis not present

## 2021-12-05 LAB — BETA HCG QUANT (REF LAB): hCG Quant: 6180 m[IU]/mL

## 2021-12-11 ENCOUNTER — Ambulatory Visit (INDEPENDENT_AMBULATORY_CARE_PROVIDER_SITE_OTHER): Payer: Medicaid Other | Admitting: *Deleted

## 2021-12-11 ENCOUNTER — Ambulatory Visit (INDEPENDENT_AMBULATORY_CARE_PROVIDER_SITE_OTHER): Payer: Medicaid Other

## 2021-12-11 VITALS — BP 141/70 | HR 75 | Wt 260.0 lb

## 2021-12-11 DIAGNOSIS — O0910 Supervision of pregnancy with history of ectopic or molar pregnancy, unspecified trimester: Secondary | ICD-10-CM

## 2021-12-11 DIAGNOSIS — O09529 Supervision of elderly multigravida, unspecified trimester: Secondary | ICD-10-CM | POA: Insufficient documentation

## 2021-12-11 DIAGNOSIS — O0990 Supervision of high risk pregnancy, unspecified, unspecified trimester: Secondary | ICD-10-CM | POA: Insufficient documentation

## 2021-12-11 DIAGNOSIS — O3680X0 Pregnancy with inconclusive fetal viability, not applicable or unspecified: Secondary | ICD-10-CM

## 2021-12-11 HISTORY — DX: Supervision of pregnancy with history of ectopic pregnancy, unspecified trimester: O09.10

## 2021-12-11 NOTE — Progress Notes (Signed)
New OB Intake  I explained I am completing New OB Intake today. We discussed her EDD of 08/05/2022 that is based on LMP. LMP of 10/29/21. Pt is G9/P2. I reviewed her allergies, medications, Medical/Surgical/OB history, and appropriate screenings.   Patient Active Problem List   Diagnosis Date Noted   Supervision of high risk pregnancy, antepartum 12/11/2021   Pregnancy with history of ectopic pregnancy, antepartum 12/11/2021   Advanced maternal age in multigravida 12/11/2021   Obesity 04/17/2011    Concerns addressed today  Delivery Plans:  Plans to deliver at [facility].     Anatomy US Explained first scheduled [place] will be around 19 weeks.   Labs Discussed Johnsie Cancel genetic screening with patient. Would like both Panorama and Horizon drawn at new OB visit. Routine prenatal labs needed.   Placed OB Box on problem list and updated   Patient informed that the ultrasound is considered a limited obstetric ultrasound and is not intended to be a complete ultrasound exam.  Patient also informed that the ultrasound is not being completed with the intent of assessing for fetal or placental anomalies or any pelvic abnormalities. Explained that the purpose of today's ultrasound is to assess for dating and fetal heart rate.  Patient acknowledges the purpose of the exam and the limitations of the study.      First visit review I reviewed new OB appt with pt. I explained she will have ob bloodwork with possible genetic screening. Explained pt will be seen by Dr Kennon Rounds at first visit.    Crosby Oyster, RN 12/11/2021  11:52 AM

## 2021-12-16 NOTE — Progress Notes (Signed)
Patient was assessed and managed by nursing staff during this encounter. I have reviewed the chart and agree with the documentation and plan. I have also made any necessary editorial changes.  Aletha Halim, MD 12/16/2021 12:20 PM

## 2021-12-22 ENCOUNTER — Ambulatory Visit (INDEPENDENT_AMBULATORY_CARE_PROVIDER_SITE_OTHER): Payer: Medicaid Other | Admitting: Physician Assistant

## 2021-12-22 ENCOUNTER — Encounter: Payer: Self-pay | Admitting: Physician Assistant

## 2021-12-22 VITALS — BP 122/87 | Ht 71.0 in | Wt 259.0 lb

## 2021-12-22 DIAGNOSIS — Z3A01 Less than 8 weeks gestation of pregnancy: Secondary | ICD-10-CM

## 2021-12-22 DIAGNOSIS — O99891 Other specified diseases and conditions complicating pregnancy: Secondary | ICD-10-CM

## 2021-12-22 DIAGNOSIS — R3915 Urgency of urination: Secondary | ICD-10-CM

## 2021-12-22 LAB — BLADDER SCAN AMB NON-IMAGING: Scan Result: 0

## 2021-12-22 MED ORDER — OXYBUTYNIN CHLORIDE ER 10 MG PO TB24: 10.0000 mg | ORAL_TABLET | Freq: Every day | ORAL | 11 refills | Status: DC

## 2021-12-22 NOTE — Patient Instructions (Signed)
Stop solifenacin and switch to oxybutynin. I sent in a 1-year supply of this medication to your pharmacy. Given your plans to breastfeed, please make sure to discuss the use of this medication during breastfeeding with your OBGYN.

## 2021-12-23 NOTE — Progress Notes (Signed)
12/22/2021 5:25 PM   NEVILLE PAULS [birth year] [id number]  CC: Chief Complaint  Patient presents with   Follow-up   HPI: Tina Melton is a 36 y.o. female with PMH OAB wet since undergoing left salpingectomy for ruptured ectopic pregnancy in June 2023 who presents today for symptom recheck on Vesicare.  Today she reports since her last visit with me 1 month ago, she learned that she is pregnant, current gestational age [redacted]W[redacted]D.  She has been taking Solifenacin, but is unsure if this is safe in light of her pregnancy.  She reports bothersome urinary urgency and frequency which she understands is normal with early pregnancy.  She wishes to continue pharmacotherapy, but wants to pursue the safest option.  She does intend to breast-feed.  PVR 0 mL.  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   History of chicken pox    History of chlamydia    History of hematuria 09/18/2021   History of hemorrhage specific to perinatal period    6-7 mos bleeding x2, heavy bldg with 2nd preg was told baby nicked 2 blood vessels   Obesity    Panic attacks    Pregnancy induced hypertension    Proteinuria 04/17/2011   Yeast infection     Surgical History: Past Surgical History:  Procedure Laterality Date   INDUCED ABORTION     LAPAROSCOPIC UNILATERAL SALPINGECTOMY Left 08/11/2021   Procedure: LAPAROSCOPIC LEFT SALPINGECTOMY WITH REMOVAL OF ECTOPIC PREGNANCY;  Surgeon: Aletha Halim, MD;  Location: [place];  Service: Gynecology;  Laterality: Left;   LIPOMA EXCISION Right 10/21/2021   Procedure: EXCISION LIPOMA;  Surgeon: Olean Ree, MD;  Location: [facility];  Service: General;  Laterality: Right;    Home Medications:  Allergies as of 12/22/2021       Reactions   Lemon Juice Anaphylaxis   Lemon Oil  Anaphylaxis   Gemtesa [vibegron] Rash        Medication List        Accurate as of December 22, 2021 11:59 PM. If you have any questions, ask your nurse or doctor.          STOP taking these medications    solifenacin 5 MG tablet Commonly known as: VESICARE Stopped by: Debroah Loop, PA-C       TAKE these medications    acetaminophen 500 MG tablet Commonly known as: TYLENOL Take 2 tablets (1,000 mg total) by mouth every 6 (six) hours as needed for mild pain.   oxybutynin 10 MG 24 hr tablet Commonly known as: DITROPAN-XL Take 1 tablet (10 mg total) by mouth daily. Started by: Debroah Loop, PA-C        Allergies:  Allergies  Allergen Reactions   Lemon Juice Anaphylaxis   Lemon Oil Anaphylaxis   Gemtesa [Vibegron] Rash    Family History: Family History  Problem Relation Age of Onset   Mental illness Mother        schizophrenia and panic attacks   Rheum arthritis Father    Arthritis Father    Hypertension Maternal Grandmother    Stroke Maternal Grandmother    Dementia Maternal Grandmother    Diabetes Maternal Grandfather    Autoimmune disease Paternal Grandmother    Arthritis Paternal Grandmother    Diabetes Paternal Grandmother    Birth defects Son  extra digit   Prostate cancer Neg Hx    Bladder Cancer Neg Hx    Kidney cancer Neg Hx     Social History:   reports that she has never smoked. She has never used smokeless tobacco. She reports that she does not currently use drugs. She reports that she does not drink alcohol.  Physical Exam: BP 122/87   Ht '5\' 11"'$  (1.803 m)   Wt 259 lb (117.5 kg)   LMP 10/29/2021   BMI 36.12 kg/m   Constitutional:  Alert and oriented, no acute distress, nontoxic appearing HEENT: [place], AT Cardiovascular: No clubbing, cyanosis, or edema Respiratory: Normal respiratory effort, no increased work of breathing Skin: No rashes, bruises or suspicious lesions Neurologic: Grossly intact, no focal  deficits, moving all 4 extremities Psychiatric: Normal mood and affect  Laboratory Data: Results for orders placed or performed in visit on 12/22/21  Bladder Scan (Post Void Residual) in office  Result Value Ref Range   Scan Result 0    Assessment & Plan:   1. Urinary urgency We discussed that the data on anticholinergics in pregnancy is very limited, however it does appear that the most reassuring data is surrounding oxybutynin.  We discussed that the safest option would be to go off of OAB agents, but that oxybutynin has been used for OAB symptoms during pregnancy.  She wishes to continue oxybutynin, so we will switch her to that today.  I am refilling her for a 1 year supply of oxybutynin.  We discussed the theoretical risk of reduced breast milk production on anticholinergics.  I encouraged her to discuss this further with her OB/GYN prior to planned breast-feeding.  I will plan to see her back in 1 year for symptom recheck.  She is in agreement with this plan. - Bladder Scan (Post Void Residual) in office - oxybutynin (DITROPAN-XL) 10 MG 24 hr tablet; Take 1 tablet (10 mg total) by mouth daily.  Dispense: 30 tablet; Refill: 11  Return in about 1 year (around 12/23/2022) for Annual OAB f/u with PVR.  Debroah Loop, PA-C  [facility] [address] [place],  [id number] [phone]

## 2022-01-04 ENCOUNTER — Inpatient Hospital Stay (HOSPITAL_COMMUNITY)
Admission: AD | Admit: 2022-01-04 | Discharge: 2022-01-04 | Disposition: A | Discharge status: Home / Self Care | Payer: Medicaid Other | Attending: Family Medicine | Admitting: Family Medicine

## 2022-01-04 ENCOUNTER — Encounter (HOSPITAL_COMMUNITY): Payer: Self-pay | Admitting: Family Medicine

## 2022-01-04 ENCOUNTER — Inpatient Hospital Stay (HOSPITAL_COMMUNITY): Payer: Medicaid Other

## 2022-01-04 DIAGNOSIS — O26891 Other specified pregnancy related conditions, first trimester: Secondary | ICD-10-CM | POA: Diagnosis not present

## 2022-01-04 DIAGNOSIS — Z3A10 10 weeks gestation of pregnancy: Secondary | ICD-10-CM | POA: Diagnosis not present

## 2022-01-04 DIAGNOSIS — Z3A09 9 weeks gestation of pregnancy: Secondary | ICD-10-CM | POA: Diagnosis not present

## 2022-01-04 DIAGNOSIS — O3411 Maternal care for benign tumor of corpus uteri, first trimester: Secondary | ICD-10-CM | POA: Diagnosis not present

## 2022-01-04 DIAGNOSIS — O209 Hemorrhage in early pregnancy, unspecified: Secondary | ICD-10-CM | POA: Diagnosis not present

## 2022-01-04 DIAGNOSIS — B3731 Acute candidiasis of vulva and vagina: Secondary | ICD-10-CM

## 2022-01-04 DIAGNOSIS — O4691 Antepartum hemorrhage, unspecified, first trimester: Secondary | ICD-10-CM | POA: Insufficient documentation

## 2022-01-04 DIAGNOSIS — O418X90 Other specified disorders of amniotic fluid and membranes, unspecified trimester, not applicable or unspecified: Secondary | ICD-10-CM

## 2022-01-04 LAB — WET PREP, GENITAL
Clue Cells Wet Prep HPF POC: NONE SEEN
Trich, Wet Prep: NONE SEEN
WBC, Wet Prep HPF POC: <10 (ref <10)
Yeast Wet Prep HPF POC: NONE SEEN

## 2022-01-04 MED ORDER — TERCONAZOLE 0.4 % VA CREA: 1.0000 | TOPICAL_CREAM | Freq: Every day | VAGINAL | 0 refills | Status: DC

## 2022-01-04 NOTE — Progress Notes (Signed)
Discharge instructions reviewed. Pt verbalized understanding.  

## 2022-01-04 NOTE — MAU Note (Signed)
..  IMUNIQUE SAMAD is a 36 y.o. at 63w4dhere in MAU reporting: Post coital vaginal bleeding. Reports that blood was bright red and all over blanket and running down leg. Denies pain.   Pain score: 0/10 Vitals:   01/04/22 0237  BP: 126/66  Pulse: 81  Resp: 17  Temp: 98.2 F (36.8 C)  SpO2: 100%     FHT:not indicated Lab orders placed from triage:  ua

## 2022-01-04 NOTE — MAU Provider Note (Signed)
History     CSN: [id number]  Arrival date and time: 01/04/22 0219   None     Chief Complaint  Patient presents with   Vaginal Bleeding   Tina Melton is a 36 y.o. [id number] at 14w4dwho [facility]  She presents today for Vaginal Bleeding.  Patient reports she had sexual activity tonight and afterwards noted a large amount of vaginal bleeding.  She denies pain during or after sex and has not noted clots.  She does endorses continued bleeding upon arrival.  She denies vaginal discharge prior to the bleeding.        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   History of chicken pox    History of chlamydia    History of hematuria 09/18/2021   History of hemorrhage specific to perinatal period    6-7 mos bleeding x2, heavy bldg with 2nd preg was told baby nicked 2 blood vessels   Obesity    Panic attacks    Pregnancy induced hypertension    Proteinuria 04/17/2011   Yeast infection     Past Surgical History:  Procedure Laterality Date   INDUCED ABORTION     LAPAROSCOPIC UNILATERAL SALPINGECTOMY Left 08/11/2021   Procedure: LAPAROSCOPIC LEFT SALPINGECTOMY WITH REMOVAL OF ECTOPIC PREGNANCY;  Surgeon: PAletha Halim MD;  [place]  Service: Gynecology;  Laterality: Left;   LIPOMA EXCISION Right 10/21/2021   Procedure: EXCISION LIPOMA;  Surgeon: POlean Ree MD;  Location: [facility];  Service: General;  Laterality: Right;    Family History  Problem Relation Age of Onset   Mental illness Mother        schizophrenia and panic attacks   Rheum arthritis Father    Arthritis Father     Hypertension Maternal Grandmother    Stroke Maternal Grandmother    Dementia Maternal Grandmother    Diabetes Maternal Grandfather    Autoimmune disease Paternal Grandmother    Arthritis Paternal Grandmother    Diabetes Paternal Grandmother    Birth defects Son        extra digit   Prostate cancer Neg Hx    Bladder Cancer Neg Hx    Kidney cancer Neg Hx     Social History   Tobacco Use   Smoking status: Never   Smokeless tobacco: Never  Vaping Use   Vaping Use: Never used  Substance Use Topics   Alcohol use: No   Drug use: Not Currently    Comment: marijuana years ago    Allergies:  Allergies  Allergen Reactions   Lemon Juice Anaphylaxis   Lemon Oil Anaphylaxis   Gemtesa [Vibegron] Rash    Medications Prior to Admission  Medication Sig Dispense Refill Last Dose   acetaminophen (TYLENOL) 500 MG tablet Take 2 tablets (1,000 mg total) by mouth every 6 (six) hours as needed for mild pain.  oxybutynin (DITROPAN-XL) 10 MG 24 hr tablet Take 1 tablet (10 mg total) by mouth daily. 30 tablet 11     Review of Systems  Eyes:  Negative for visual disturbance.  Gastrointestinal:  Negative for abdominal pain, constipation, diarrhea, nausea and vomiting.  Genitourinary:  Positive for vaginal bleeding. Negative for difficulty urinating, dysuria and vaginal discharge.  Neurological:  Negative for dizziness, light-headedness and headaches.   Physical Exam   Blood pressure 126/66, pulse 81, temperature 98.2 F (36.8 C), temperature source Oral, resp. rate 17, height '5\' 11"'$  (1.803 m), weight 120.1 kg, last menstrual period 10/29/2021, SpO2 100 %, unknown if currently breastfeeding.  Physical Exam Vitals reviewed. Exam conducted with a chaperone present.  Constitutional:      General: She is not in acute distress.    Appearance: Normal appearance.  HENT:     Head: Normocephalic and atraumatic.  Eyes:     Conjunctiva/sclera: Conjunctivae normal.  Cardiovascular:     Rate and  Rhythm: Normal rate.  Pulmonary:     Effort: Pulmonary effort is normal. No respiratory distress.  Abdominal:     Palpations: Abdomen is soft.     Tenderness: There is no abdominal tenderness.  Genitourinary:    General: Normal vulva.     Comments: Speculum Exam: -Normal External Genitalia: Non tender, Small amt blood noted at introitus and around vulva area.  Area cleaned revealing abrasions c/w recent sexual activity. No active bleeding.  -Vaginal Vault: Pink mucosa with good rugae. Small amt mucoid blood noted and removed with faux swab x2-wet prep collected -Cervix:Pink, no lesions, cysts, or polyps.  Appears closed. Scant amt blood from os-GC/CT collected -Bimanual Exam:  No CMT, Uterine size c/w dates.  No tenderness in cul de sac   Musculoskeletal:     Cervical back: Normal range of motion.  Skin:    General: Skin is warm and dry.  Neurological:     Mental Status: She is alert and oriented to person, place, and time.  Psychiatric:        Mood and Affect: Mood normal.        Behavior: Behavior normal.     MAU Course  Procedures Results for orders placed or performed during the hospital encounter of 01/04/22 (from the past 24 hour(s))  Wet prep, genital     Status: None   Collection Time: 01/04/22  3:15 AM  Result Value Ref Range   Yeast Wet Prep HPF POC NONE SEEN NONE SEEN   Trich, Wet Prep NONE SEEN NONE SEEN   Clue Cells Wet Prep HPF POC NONE SEEN NONE SEEN   WBC, Wet Prep HPF POC <10 <10   Sperm PRESENT     MDM Pelvic Exam; Wet Prep and GC/CT Labs: None Ultrasound Prescription Assessment and Plan  36 year old, [id number]  SIUP at 9.4 weeks Vaginal Bleeding  -Reviewed POC with patient. -Exam performed and findings discussed.  -Cultures collected. -Cautioned that findings suggestive of BV will likely return d/t recent sexual activity.  Discussed no treatment with lack of symptoms.  -Informed that findings are suspicious for [facility]. -Reassured and educated on  [facility], including what to expect including bleeding, risks for miscarriage, and resolution.  -Will send for formal US and await results.   Maryann Conners 01/04/2022, 2:58 AM   Reassessment (4:33 AM) -Results as above. -Patient informed that Partial chorioamniotic separation noted and reviewed increased risks for SAB. -Bleeding Precautions reviewed and provider recommends 72 hours of pelvic rest after cessation of  current bleeding.  -Informed of yeast found on wet prep and will treat. -Rx sent to pharmacy on file.  -Instructed to keep next appt as scheduled. -Encouraged to call primary office or return to MAU if symptoms worsen or with the onset of new symptoms. -Discharged to home in stable condition.  Maryann Conners MSN, CNM Advanced Practice Provider, [facility] for [employer]

## 2022-01-05 LAB — GC/CHLAMYDIA PROBE AMP (~~LOC~~) NOT AT ARMC
Chlamydia: NEGATIVE
Comment: NEGATIVE
Comment: NORMAL
Neisseria Gonorrhea: NEGATIVE

## 2022-01-14 ENCOUNTER — Encounter: Payer: Self-pay | Admitting: Family Medicine

## 2022-01-14 ENCOUNTER — Ambulatory Visit (INDEPENDENT_AMBULATORY_CARE_PROVIDER_SITE_OTHER): Payer: Medicaid Other

## 2022-01-14 ENCOUNTER — Other Ambulatory Visit (HOSPITAL_COMMUNITY)
Admission: RE | Admit: 2022-01-14 | Discharge: 2022-01-14 | Disposition: A | Discharge status: Home / Self Care | Payer: Medicaid Other | Source: Ambulatory Visit | Attending: Family Medicine | Admitting: Family Medicine

## 2022-01-14 ENCOUNTER — Ambulatory Visit (INDEPENDENT_AMBULATORY_CARE_PROVIDER_SITE_OTHER): Payer: Medicaid Other | Admitting: Family Medicine

## 2022-01-14 VITALS — BP 152/84 | HR 82 | Wt 264.0 lb

## 2022-01-14 DIAGNOSIS — O0990 Supervision of high risk pregnancy, unspecified, unspecified trimester: Secondary | ICD-10-CM | POA: Insufficient documentation

## 2022-01-14 DIAGNOSIS — O09291 Supervision of pregnancy with other poor reproductive or obstetric history, first trimester: Secondary | ICD-10-CM

## 2022-01-14 DIAGNOSIS — O09299 Supervision of pregnancy with other poor reproductive or obstetric history, unspecified trimester: Secondary | ICD-10-CM

## 2022-01-14 DIAGNOSIS — O09521 Supervision of elderly multigravida, first trimester: Secondary | ICD-10-CM

## 2022-01-14 DIAGNOSIS — O0991 Supervision of high risk pregnancy, unspecified, first trimester: Secondary | ICD-10-CM

## 2022-01-14 DIAGNOSIS — Z6836 Body mass index (BMI) 36.0-36.9, adult: Secondary | ICD-10-CM

## 2022-01-14 DIAGNOSIS — E6609 Other obesity due to excess calories: Secondary | ICD-10-CM

## 2022-01-14 DIAGNOSIS — Z3A11 11 weeks gestation of pregnancy: Secondary | ICD-10-CM

## 2022-01-14 DIAGNOSIS — Z124 Encounter for screening for malignant neoplasm of cervix: Secondary | ICD-10-CM

## 2022-01-14 HISTORY — DX: Supervision of pregnancy with other poor reproductive or obstetric history, unspecified trimester: O09.299

## 2022-01-14 MED ORDER — ASPIRIN 81 MG PO TBEC: 81.0000 mg | DELAYED_RELEASE_TABLET | Freq: Every day | ORAL | 2 refills | Status: DC

## 2022-01-14 NOTE — Progress Notes (Signed)
Subjective:   Tina Melton is a 36 y.o. [id number] at 90w0dby LMP, early ultrasound being seen today for her first obstetrical visit.  Her obstetrical history is significant for advanced maternal age, obesity, and pregnancy induced hypertension. Patient does intend to breast feed. Pregnancy history fully reviewed.  Patient reports no complaints.  HISTORY: OB History  Gravida Para Term Preterm AB Living  '9 2 2 '$ 0 6 2  SAB IAB Ectopic Multiple Live Births  '1 4 1 '$ 0 2    # Outcome Date GA Lbr Len/2nd Weight Sex Delivery Anes PTL Lv  9 Current           8 Ectopic 07/2021     ECTOPIC     7 IAB 08/2020          6 Term 04/17/11 415w1d4:18 / 00:11 6 lb 10.3 oz (3.014 kg) M Vag-Spont EPI  LIV     Name: WHOTIS, BURRESS   Apgar1: 9  Apgar5: 9  5 IAB 2010          4 IAB 2009 9w60w0d      3 Term 2005 43w41w0d00 5 lb 10 oz (2.551 kg) M Vag-Spont None  LIV  2 IAB 2004          1 SAB 2001 47w0d30w0d     Last pap smear was  2022 @ [facility] and was normal Past Medical History:  Diagnosis Date   Abnormal cervical Papanicolaou smear 02/17/2008   Anemia    Arthritis    Chlamydial infection 10/17/2021   Ectopic pregnancy without intrauterine pregnancy 08/11/2021   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   History of chicken pox    History of chlamydia    History of hematuria 09/18/2021   History of hemorrhage specific to perinatal period    6-7 mos bleeding x2, heavy bldg with 2nd preg was told baby nicked 2 blood vessels   Obesity    Panic attacks    Pregnancy induced hypertension    Proteinuria 04/17/2011   Yeast infection    Past Surgical History:  Procedure Laterality Date   INDUCED ABORTION     LAPAROSCOPIC UNILATERAL SALPINGECTOMY Left 08/11/2021   Procedure: LAPAROSCOPIC LEFT SALPINGECTOMY WITH REMOVAL OF ECTOPIC PREGNANCY;  Surgeon: PickeAletha Halim  [facility];  Laterality: Left;   LIPOMA EXCISION Right 10/21/2021   Procedure: EXCISION LIPOMA;  Surgeon: PiscoOlean Ree;  Location: [facility];  Service: General;  Laterality: Right;   Family History  Problem Relation Age of Onset   Mental illness Mother        schizophrenia and panic attacks   Rheum arthritis Father    Arthritis Father    Birth defects Son        extra digit   Hypertension Maternal Grandmother    Stroke Maternal Grandmother    Dementia Maternal Grandmother    Diabetes Maternal Grandfather    Rheum arthritis Paternal Grandmother    Arthritis Paternal Grandmother    Diabetes Paternal Grandmother    Prostate cancer Neg Hx    Bladder Cancer Neg Hx    Kidney cancer Neg Hx    Social History   Tobacco Use   Smoking status: Never   Smokeless tobacco: Never  Vaping Use   Vaping Use: Never used  Substance Use Topics   Alcohol use: No   Drug use: Not Currently    Comment:  marijuana years ago   Allergies  Allergen Reactions   Lemon Juice Anaphylaxis   Lemon Oil Anaphylaxis   Gemtesa [Vibegron] Rash   No current outpatient medications on file prior to visit.   No current facility-administered medications on file prior to visit.     Exam   Vitals:   01/14/22 1455  BP: (!) 152/84  Pulse: 82  Weight: 264 lb (119.7 kg)   Fetal Heart Rate (bpm): 172  Uterus:   12 week size  Pelvic Exam: Perineum: no hemorrhoids, normal perineum   Vulva: normal external genitalia, no lesions   Vagina:  normal mucosa, normal discharge   Cervix: no lesions and normal, pap smear done.    Adnexa: normal adnexa and no mass, fullness, tenderness   Bony Pelvis: average  System: General: well-developed, well-nourished female in no acute distress   Breast:  normal appearance, no masses or tenderness   Skin: normal coloration and turgor, no rashes   Neurologic: oriented, normal, negative, normal mood   Extremities: normal strength, tone, and muscle mass, ROM of all joints is normal   HEENT PERRLA, extraocular movement intact and sclera clear, anicteric   Mouth/Teeth mucous membranes moist,  pharynx normal without lesions and dental hygiene good   Neck supple and no masses   Cardiovascular: regular rate and rhythm   Respiratory:  no respiratory distress, normal breath sounds   Abdomen: soft, non-tender; bowel sounds normal; no masses,  no organomegaly     Assessment:   Pregnancy: [id number] Patient Active Problem List   Diagnosis Date Noted   Supervision of high risk pregnancy, antepartum 12/11/2021   Pregnancy with history of ectopic pregnancy, antepartum 12/11/2021   Advanced maternal age in multigravida 12/11/2021   Obesity 04/17/2011     Plan:  1. Supervision of high risk pregnancy, antepartum New OB labs - CBC/D/Plt+RPR+Rh+ABO+RubIgG... - Culture, OB Urine - [place] MFM OB DETAIL +14 WK; Future - HORIZON CUSTOM - US OB Limited; Future  2. Multigravida of advanced maternal age in first trimester NIPT ordered - PANORAMA PRENATAL TEST FULL PANEL  3. History of pre-eclampsia in prior pregnancy, currently pregnant Baseline labs Start ASA at 12 weeks - Comprehensive metabolic panel - Protein / creatinine ratio, urine - aspirin EC 81 MG tablet; Take 1 tablet (81 mg total) by mouth daily. Take after 12 weeks for prevention of preeclampsia later in pregnancy  Dispense: 300 tablet; Refill: 2  4. Class 2 obesity due to excess calories without serious comorbidity with body mass index (BMI) of 36.0 to 36.9 in adult Baseline labs On ASA - Hemoglobin A1c  5. Screening for cervical cancer - Cytology - PAP     Initial labs drawn. Continue prenatal vitamins. Genetic Screening discussed, NIPS: ordered. Ultrasound discussed; fetal anatomic survey: ordered. Problem list reviewed and updated. The nature of [place] with multiple MDs and other Advanced Practice Providers was explained to patient; also emphasized that residents, students are part of our team. Routine obstetric precautions reviewed. Return in 4 weeks (on  02/11/2022).

## 2022-01-14 NOTE — Progress Notes (Signed)
NOB 11wks  Last Pap: 2022 [facility] per pink note pt wants to do pap today. Genetic Screening:Desires and wants to know Gender   Flu Vaccine: Declined   CC: Dizzy spells, and feeling off balance to where she will almost fall, notes seeing stars.  Pt had to go to MAU for vaginal bleeding noted working out prior and during intercourse.

## 2022-01-16 LAB — COMPREHENSIVE METABOLIC PANEL
ALT: 8 IU/L (ref 0–32)
AST: 10 IU/L (ref 0–40)
Albumin/Globulin Ratio: 1.4 (ref 1.2–2.2)
Albumin: 4.2 g/dL (ref 3.9–4.9)
Alkaline Phosphatase: 55 IU/L (ref 44–121)
BUN/Creatinine Ratio: 19 (ref 9–23)
BUN: 12 mg/dL (ref 6–20)
Bilirubin Total: 0.5 mg/dL (ref 0.0–1.2)
CO2: 22 mmol/L (ref 20–29)
Calcium: 9.3 mg/dL (ref 8.7–10.2)
Chloride: 101 mmol/L (ref 96–106)
Creatinine, Ser: 0.64 mg/dL (ref 0.57–1.00)
Globulin, Total: 3 g/dL (ref 1.5–4.5)
Glucose: 78 mg/dL (ref 70–99)
Potassium: 3.7 mmol/L (ref 3.5–5.2)
Sodium: 138 mmol/L (ref 134–144)
Total Protein: 7.2 g/dL (ref 6.0–8.5)
eGFR: 116 mL/min/{1.73_m2} (ref >59)

## 2022-01-16 LAB — CBC/D/PLT+RPR+RH+ABO+RUBIGG...
Antibody Screen: NEGATIVE
Basophils Absolute: 0 10*3/uL (ref 0.0–0.2)
Basos: 1 %
EOS (ABSOLUTE): 0.1 10*3/uL (ref 0.0–0.4)
Eos: 2 %
HCV Ab: NONREACTIVE
HIV Screen 4th Generation wRfx: NONREACTIVE
Hematocrit: 36.3 % (ref 34.0–46.6)
Hemoglobin: 12.6 g/dL (ref 11.1–15.9)
Hepatitis B Surface Ag: NEGATIVE
Immature Grans (Abs): 0 10*3/uL (ref 0.0–0.1)
Immature Granulocytes: 0 %
Lymphocytes Absolute: 1.6 10*3/uL (ref 0.7–3.1)
Lymphs: 27 %
MCH: 27.8 pg (ref 26.6–33.0)
MCHC: 34.7 g/dL (ref 31.5–35.7)
MCV: 80 fL (ref 79–97)
Monocytes Absolute: 0.4 10*3/uL (ref 0.1–0.9)
Monocytes: 7 %
Neutrophils Absolute: 3.8 10*3/uL (ref 1.4–7.0)
Neutrophils: 63 %
Platelets: 300 10*3/uL (ref 150–450)
RBC: 4.53 x10E6/uL (ref 3.77–5.28)
RDW: 12.7 % (ref 11.7–15.4)
RPR Ser Ql: NONREACTIVE
Rh Factor: POSITIVE
Rubella Antibodies, IGG: 7.51 index (ref >0.99)
WBC: 6 10*3/uL (ref 3.4–10.8)

## 2022-01-16 LAB — HEMOGLOBIN A1C
Est. average glucose Bld gHb Est-mCnc: 100 mg/dL
Hgb A1c MFr Bld: 5.1 % (ref 4.8–5.6)

## 2022-01-16 LAB — HCV INTERPRETATION

## 2022-01-16 LAB — PROTEIN / CREATININE RATIO, URINE
Creatinine, Urine: 143.4 mg/dL
Protein, Ur: 10.2 mg/dL
Protein/Creat Ratio: 71 mg/g creat (ref 0–200)

## 2022-01-16 LAB — URINE CULTURE, OB REFLEX: Organism ID, Bacteria: NO GROWTH

## 2022-01-16 LAB — CULTURE, OB URINE

## 2022-01-19 LAB — CYTOLOGY - PAP
Chlamydia: NEGATIVE
Comment: NEGATIVE
Comment: NEGATIVE
Comment: NORMAL
Diagnosis: NEGATIVE
Diagnosis: REACTIVE
High risk HPV: NEGATIVE
Neisseria Gonorrhea: NEGATIVE

## 2022-01-27 LAB — PANORAMA PRENATAL TEST FULL PANEL:PANORAMA TEST PLUS 5 ADDITIONAL MICRODELETIONS: FETAL FRACTION: 4.1

## 2022-01-27 LAB — HORIZON CUSTOM: REPORT SUMMARY: NEGATIVE

## 2022-02-16 NOTE — L&D Delivery Note (Signed)
Delivery Note Called to room because patient was complete and ready to push, she received cytotec only and was not pitocin with ctx eery 7 minutes. RN has done practice pushing and she moved baby well.   Tina Melton pushed during one contraction to deliver her baby. Performed delayed cord clamping ~1 minute. Cord was [place] and flattened.   At 9:21 AM a viable female was delivered via Vaginal, Spontaneous (Presentation: Left Occiput Anterior).  APGAR: 7, 9; weight 5 lb 15.2 oz (2700 g).   Placenta status: Spontaneous, Intact.  Cord: 3 vessels with the following complications: None.  Cord pH: Not collected  Anesthesia: Epidural Episiotomy: None Lacerations: None Suture Repair:  NA Est. Blood Loss (mL):  55  Mom to postpartum.  Baby to Couplet care / Skin to Skin.  Tina Melton [facility] 07/16/2022, 9:36 AM

## 2022-02-19 ENCOUNTER — Ambulatory Visit (INDEPENDENT_AMBULATORY_CARE_PROVIDER_SITE_OTHER): Payer: Medicaid Other | Admitting: Obstetrics and Gynecology

## 2022-02-19 ENCOUNTER — Encounter: Payer: Self-pay | Admitting: Obstetrics and Gynecology

## 2022-02-19 VITALS — BP 138/88 | HR 84 | Wt 272.0 lb

## 2022-02-19 DIAGNOSIS — O0990 Supervision of high risk pregnancy, unspecified, unspecified trimester: Secondary | ICD-10-CM | POA: Diagnosis not present

## 2022-02-19 DIAGNOSIS — Z3A16 16 weeks gestation of pregnancy: Secondary | ICD-10-CM

## 2022-02-19 DIAGNOSIS — Z8759 Personal history of other complications of pregnancy, childbirth and the puerperium: Secondary | ICD-10-CM | POA: Insufficient documentation

## 2022-02-19 DIAGNOSIS — O99212 Obesity complicating pregnancy, second trimester: Secondary | ICD-10-CM

## 2022-02-19 DIAGNOSIS — Z6838 Body mass index (BMI) 38.0-38.9, adult: Secondary | ICD-10-CM

## 2022-02-19 DIAGNOSIS — O09522 Supervision of elderly multigravida, second trimester: Secondary | ICD-10-CM | POA: Diagnosis not present

## 2022-02-19 DIAGNOSIS — O139 Gestational [pregnancy-induced] hypertension without significant proteinuria, unspecified trimester: Secondary | ICD-10-CM | POA: Insufficient documentation

## 2022-02-19 DIAGNOSIS — O132 Gestational [pregnancy-induced] hypertension without significant proteinuria, second trimester: Secondary | ICD-10-CM

## 2022-02-19 DIAGNOSIS — O0992 Supervision of high risk pregnancy, unspecified, second trimester: Secondary | ICD-10-CM

## 2022-02-19 NOTE — Progress Notes (Signed)
   PRENATAL VISIT NOTE  Subjective:  Tina Melton is a 37 y.o. D0V0131 at 63w1dbeing seen today for ongoing prenatal care.  She is currently monitored for the following issues for this high-risk pregnancy and has Obesity; Supervision of high risk pregnancy, antepartum; Pregnancy with history of ectopic pregnancy, antepartum; Advanced maternal age in multigravida; History of pre-eclampsia in prior pregnancy, currently pregnant; and Transient hypertension of pregnancy in second trimester on their problem list.  Patient reports no complaints.  Contractions: Not present. Vag. Bleeding: None.  Movement: Present. Denies leaking of fluid.   The following portions of the patient's history were reviewed and updated as appropriate: allergies, current medications, past family history, past medical history, past social history, past surgical history and problem list.   Objective:   Vitals:   02/19/22 1415  BP: 138/88  Pulse: 84  Weight: 272 lb (123.4 kg)    Fetal Status: Fetal Heart Rate (bpm): 163   Movement: Present     General:  Alert, oriented and cooperative. Patient is in no acute distress.  Skin: Skin is warm and dry. No rash noted.   Cardiovascular: Normal heart rate noted  Respiratory: Normal respiratory effort, no problems with respiration noted  Abdomen: Soft, gravid, appropriate for gestational age.  Pain/Pressure: Present     Pelvic: Cervical exam deferred        Extremities: Normal range of motion.  Edema: None  Mental Status: Normal mood and affect. Normal behavior. Normal judgment and thought content.   Assessment and Plan:  Pregnancy: [id number] 156w1d. Supervision of high risk pregnancy, antepartum Was doing crossfit pre pregnancy but got too worn out, headache (see RN note) if tries to do same as before. D/w her to listen to her body re: what is too much in terms of physical activity.  - AFP, Serum, Open Spina Bifida - TSH Rfx on Abnormal to Free T4  2. Multigravida of  advanced maternal age in second trimester - AFP, Serum, Open Spina Bifida  3. Transient hypertension of pregnancy in second trimester Normal today. Pt hasn't started low dose asa yet. D/w her re: rationale and pt amenable to starting - TSH Rfx on Abnormal to Free T4  4. Obesity affecting pregnancy in second trimester, unspecified obesity type 12 lbs weight gain this pregnancy  5. BMI 38.0-38.9,adult  Preterm labor symptoms and general obstetric precautions including but not limited to vaginal bleeding, contractions, leaking of fluid and fetal movement were reviewed in detail with the patient. Please refer to After Visit Summary for other counseling recommendations.   No follow-ups on file.  Future Appointments  Date Time Provider [place]1/23/2024  1:30 PM [facility]1/23/2024  1:45 PM [facility] US5 [facility]2/02/2022  1:50 PM PiAletha HalimMD [facility]  04/16/2022  2:30 PM PiAletha HalimMD [facility]  12/24/2022 10:00 AM Vaillancourt, SaAldona BarPA-C [facility] None    ChAletha HalimMD

## 2022-02-19 NOTE — Progress Notes (Signed)
ROB [redacted]w[redacted]d AFP Today.   CC: lower abdominal pain, increase in appetite, pt is active in the gym and has trainer. Notes HA's on/off has had a episode of seeing stars.   Need assistance getting FHTs.

## 2022-02-21 LAB — AFP, SERUM, OPEN SPINA BIFIDA
AFP MoM: 1.96
AFP Value: 46.9 ng/mL
Gest. Age on Collection Date: 16 weeks
Maternal Age At EDD: 39 yr
OSBR Risk 1 IN: 881
Test Results:: NEGATIVE
Weight: 272 [lb_av]

## 2022-02-21 LAB — T4F: T4,Free (Direct): 1.28 ng/dL (ref 0.82–1.77)

## 2022-02-21 LAB — TSH RFX ON ABNORMAL TO FREE T4: TSH: 0.044 u[IU]/mL — ABNORMAL LOW (ref 0.450–4.500)

## 2022-02-24 ENCOUNTER — Encounter: Payer: Self-pay | Admitting: Obstetrics and Gynecology

## 2022-02-24 DIAGNOSIS — E038 Other specified hypothyroidism: Secondary | ICD-10-CM

## 2022-02-25 MED ORDER — LEVOTHYROXINE SODIUM 25 MCG PO TABS: 25.0000 ug | ORAL_TABLET | Freq: Every day | ORAL | 0 refills | Status: DC

## 2022-03-10 ENCOUNTER — Ambulatory Visit: Payer: Medicaid Other | Attending: Family Medicine

## 2022-03-10 ENCOUNTER — Ambulatory Visit: Payer: Medicaid Other

## 2022-03-10 ENCOUNTER — Other Ambulatory Visit: Payer: Self-pay

## 2022-03-10 VITALS — BP 136/78 | HR 79

## 2022-03-10 DIAGNOSIS — D259 Leiomyoma of uterus, unspecified: Secondary | ICD-10-CM | POA: Diagnosis not present

## 2022-03-10 DIAGNOSIS — E039 Hypothyroidism, unspecified: Secondary | ICD-10-CM | POA: Diagnosis not present

## 2022-03-10 DIAGNOSIS — Z3A18 18 weeks gestation of pregnancy: Secondary | ICD-10-CM | POA: Diagnosis not present

## 2022-03-10 DIAGNOSIS — O0990 Supervision of high risk pregnancy, unspecified, unspecified trimester: Secondary | ICD-10-CM | POA: Insufficient documentation

## 2022-03-10 DIAGNOSIS — O09292 Supervision of pregnancy with other poor reproductive or obstetric history, second trimester: Secondary | ICD-10-CM

## 2022-03-10 DIAGNOSIS — O09299 Supervision of pregnancy with other poor reproductive or obstetric history, unspecified trimester: Secondary | ICD-10-CM

## 2022-03-10 DIAGNOSIS — O99282 Endocrine, nutritional and metabolic diseases complicating pregnancy, second trimester: Secondary | ICD-10-CM

## 2022-03-10 DIAGNOSIS — O132 Gestational [pregnancy-induced] hypertension without significant proteinuria, second trimester: Secondary | ICD-10-CM | POA: Diagnosis not present

## 2022-03-10 DIAGNOSIS — E669 Obesity, unspecified: Secondary | ICD-10-CM | POA: Diagnosis not present

## 2022-03-10 DIAGNOSIS — O09522 Supervision of elderly multigravida, second trimester: Secondary | ICD-10-CM

## 2022-03-10 DIAGNOSIS — O99212 Obesity complicating pregnancy, second trimester: Secondary | ICD-10-CM | POA: Diagnosis not present

## 2022-03-10 DIAGNOSIS — O3412 Maternal care for benign tumor of corpus uteri, second trimester: Secondary | ICD-10-CM | POA: Diagnosis not present

## 2022-03-10 DIAGNOSIS — Z362 Encounter for other antenatal screening follow-up: Secondary | ICD-10-CM

## 2022-03-13 DIAGNOSIS — Z111 Encounter for screening for respiratory tuberculosis: Secondary | ICD-10-CM | POA: Diagnosis not present

## 2022-03-19 ENCOUNTER — Telehealth (INDEPENDENT_AMBULATORY_CARE_PROVIDER_SITE_OTHER): Payer: Medicaid Other | Admitting: Obstetrics and Gynecology

## 2022-03-19 DIAGNOSIS — O09292 Supervision of pregnancy with other poor reproductive or obstetric history, second trimester: Secondary | ICD-10-CM

## 2022-03-19 DIAGNOSIS — J069 Acute upper respiratory infection, unspecified: Secondary | ICD-10-CM

## 2022-03-19 DIAGNOSIS — E669 Obesity, unspecified: Secondary | ICD-10-CM

## 2022-03-19 DIAGNOSIS — O99212 Obesity complicating pregnancy, second trimester: Secondary | ICD-10-CM

## 2022-03-19 DIAGNOSIS — O0990 Supervision of high risk pregnancy, unspecified, unspecified trimester: Secondary | ICD-10-CM

## 2022-03-19 DIAGNOSIS — E038 Other specified hypothyroidism: Secondary | ICD-10-CM

## 2022-03-19 DIAGNOSIS — O10013 Pre-existing essential hypertension complicating pregnancy, third trimester: Secondary | ICD-10-CM

## 2022-03-19 DIAGNOSIS — O09522 Supervision of elderly multigravida, second trimester: Secondary | ICD-10-CM

## 2022-03-19 DIAGNOSIS — O132 Gestational [pregnancy-induced] hypertension without significant proteinuria, second trimester: Secondary | ICD-10-CM

## 2022-03-19 DIAGNOSIS — Z3A20 20 weeks gestation of pregnancy: Secondary | ICD-10-CM

## 2022-03-19 DIAGNOSIS — O99512 Diseases of the respiratory system complicating pregnancy, second trimester: Secondary | ICD-10-CM

## 2022-03-19 DIAGNOSIS — E6609 Other obesity due to excess calories: Secondary | ICD-10-CM

## 2022-03-19 DIAGNOSIS — O09299 Supervision of pregnancy with other poor reproductive or obstetric history, unspecified trimester: Secondary | ICD-10-CM

## 2022-03-19 NOTE — Progress Notes (Signed)
    TELEHEALTH OBSTETRICS VISIT ENCOUNTER NOTE  Provider location: [facility]   Patient location: Home  I connected with Tina Melton on 03/19/22 at  1:50 PM EST by telephone at home and verified that I am speaking with the correct person using two identifiers. Of note, unable to do video encounter due to technical difficulties.    I discussed the limitations, risks, security and privacy concerns of performing an evaluation and management service by telephone and the availability of in person appointments. I also discussed with the patient that there may be a patient responsible charge related to this service. The patient expressed understanding and agreed to proceed.  Subjective:  Tina Melton is a 37 y.o. [id number] at 62w1dbeing followed for ongoing prenatal care.  She is currently monitored for the following issues for this high-risk pregnancy and has Obesity; Supervision of high risk pregnancy, antepartum; Pregnancy with history of ectopic pregnancy, antepartum; Advanced maternal age in multigravida; History of pre-eclampsia in prior pregnancy, currently pregnant; Transient hypertension of pregnancy in second trimester; and Subclinical hypothyroidism on their problem list.  Patient reports  URI s/s . Reports fetal movement. Denies any contractions, bleeding or leaking of fluid.   The following portions of the patient's history were reviewed and updated as appropriate: allergies, current medications, past family history, past medical history, past social history, past surgical history and problem list.   Objective:  Last menstrual period 10/29/2021, unknown if currently breastfeeding. General:  Alert, oriented and cooperative.   Mental Status: Normal mood and affect perceived. Normal judgment and thought content.  Rest of physical exam deferred due to type of encounter  Assessment and Plan:  Pregnancy: [id number] 267w1d. Supervision of high risk  pregnancy, antepartum Routine care If able to, pt to try and take BP and weight and let usKoreanow  2. History of pre-eclampsia in prior pregnancy, currently pregnant Continue low dose ASA.  3. Upper respiratory tract infection, unspecified type MAU precautions given. OTC med list given  4. Multigravida of advanced maternal age in second trimester F/u surveillance growth  5. Class 2 obesity in adult, unspecified BMI, unspecified obesity type, unspecified whether serious comorbidity present  6. Subclinical hypothyroidism Continue synthroid. Check TSH mid feb  7. Transient hypertension of pregnancy in second trimester See above  Preterm labor symptoms and general obstetric precautions including but not limited to vaginal bleeding, contractions, leaking of fluid and fetal movement were reviewed in detail with the patient.  I discussed the assessment and treatment plan with the patient. The patient was provided an opportunity to ask questions and all were answered. The patient agreed with the plan and demonstrated an understanding of the instructions. The patient was advised to call back or seek an in-person office evaluation/[facility] any [facility] or concerning symptoms. Please refer to After Visit Summary for other counseling recommendations.   I provided 7 minutes of non-face-to-face time during this encounter.  Return in about 2 weeks (around 04/02/2022).  Future Appointments  Date Time Provider [place]2/19/2024 12:30 PM [facility]2/19/2024 12:45 PM WMC-MFC US5 [facility]2/29/2024  2:30 PM PiAletha HalimMD [facility]WSCA [facility]  12/24/2022 10:00 AM Vaillancourt, SaAldona BarPA-C BUA-BUA None    ChAletha HalimMDMedonor [facility]

## 2022-03-19 NOTE — Patient Instructions (Signed)
  Safe Medications in Pregnancy  that are over the counter  Acne:  Benzoyl Peroxide  Salicylic Acid  *NO-Retin A  Backache/Headache:  Tylenol: 2 regular strength every 4 hours OR               2 Extra strength every 6 hours   Colds/Coughs/Allergies: Allegra Benadryl (alcohol free) 25 mg every 6 hours as needed  Breath right strips  Claritin  Cepacol throat lozenges  Chloraseptic throat spray  Cold-Eeze- up to three times per day  Cough drops, alcohol free  Flonase Guaifenesin  Mucinex (plain/DM) Nasacort Robitussin DM (plain only, alcohol free)  Saline nasal spray/drops Steroid nasal sprays Sudafed (pseudoephedrine) & Actifed * use only after [redacted] weeks gestation and if you do not have high blood pressure  Tylenol  Vicks Vaporub  Zicam Zinc lozenges  Zyrtec   -Make sure to not take anything that has the active ingredient Phenylephrine   Constipation:  Colace  Ducolax suppositories  Fleet enema  Glycerin suppositories  Metamucil  Milk of magnesia  Miralax  Senokot  Smooth move tea   Diarrhea:  Kaopectate  Imodium A-D   *NO Pepto Bismol   Hemorrhoids:  Anusol  Anusol HC  Preparation H  Tucks   Indigestion:  Tums  Maalox  Mylanta  Nexium Pepcid  Zantac Prevacid Protonix Prilosec   Insomnia:  Benadryl (alcohol free) '25mg'$  every 6 hours as needed  Tylenol PM  Unisom  Leg Cramps:  Tums  Magnesium tablets  Nausea/Vomiting:  Bonine  Dramamine  Emetrol  Ginger extract  Sea bands  Meclizine   Nausea medication to take during pregnancy:  Unisom (doxylamine succinate 25 mg tablets) Take one tablet daily at bedtime. If symptoms are not adequately controlled, the dose can be increased to a maximum recommended dose of two tablets daily (1/2 tablet in the morning, 1/2 tablet mid-afternoon and one at bedtime).  Vitamin B6 '100mg'$  tablets. Take one tablet twice a day (up to 200 mg per day).   Skin Rashes:  Aveeno products  Benadryl cream or  Benadryl tablets '25mg'$  every 6 hours as needed  Calamine Lotion  1% Hydrocortisone cream   Yeast infection:  Gyne-lotrimin 7  Monistat 3 or 7day    **If taking multiple medications, please check labels to avoid duplicating the same active ingredients  **take medication as directed on the label ** ** Do not exceed 4000 mg of Tylenol in 24 hours**  **Do not take medications that contain Aspirin or Ibuprofen** Unless your doctor has prescribed low dose Aspirin for prevention of  Pre-eclampsia

## 2022-03-19 NOTE — Progress Notes (Signed)
ROB  CC: cold, congested, HA and sinus pressure   Pt was not able to get prescribed  Rx pharmacy did not have in stock.   Pt not able to B/P no cuff.

## 2022-03-29 ENCOUNTER — Other Ambulatory Visit: Payer: Self-pay | Admitting: Obstetrics and Gynecology

## 2022-04-06 ENCOUNTER — Other Ambulatory Visit: Payer: Self-pay | Admitting: *Deleted

## 2022-04-06 ENCOUNTER — Other Ambulatory Visit: Payer: Medicaid Other

## 2022-04-06 ENCOUNTER — Ambulatory Visit: Payer: Medicaid Other | Attending: Obstetrics

## 2022-04-06 ENCOUNTER — Encounter: Payer: Self-pay | Admitting: *Deleted

## 2022-04-06 ENCOUNTER — Ambulatory Visit: Payer: Medicaid Other | Admitting: *Deleted

## 2022-04-06 VITALS — BP 114/75 | HR 96

## 2022-04-06 DIAGNOSIS — O132 Gestational [pregnancy-induced] hypertension without significant proteinuria, second trimester: Secondary | ICD-10-CM | POA: Diagnosis not present

## 2022-04-06 DIAGNOSIS — E669 Obesity, unspecified: Secondary | ICD-10-CM

## 2022-04-06 DIAGNOSIS — O09299 Supervision of pregnancy with other poor reproductive or obstetric history, unspecified trimester: Secondary | ICD-10-CM

## 2022-04-06 DIAGNOSIS — Z362 Encounter for other antenatal screening follow-up: Secondary | ICD-10-CM | POA: Diagnosis not present

## 2022-04-06 DIAGNOSIS — O09523 Supervision of elderly multigravida, third trimester: Secondary | ICD-10-CM

## 2022-04-06 DIAGNOSIS — O09292 Supervision of pregnancy with other poor reproductive or obstetric history, second trimester: Secondary | ICD-10-CM

## 2022-04-06 DIAGNOSIS — Z3A22 22 weeks gestation of pregnancy: Secondary | ICD-10-CM

## 2022-04-06 DIAGNOSIS — O0990 Supervision of high risk pregnancy, unspecified, unspecified trimester: Secondary | ICD-10-CM

## 2022-04-06 DIAGNOSIS — O99212 Obesity complicating pregnancy, second trimester: Secondary | ICD-10-CM | POA: Diagnosis not present

## 2022-04-06 DIAGNOSIS — E039 Hypothyroidism, unspecified: Secondary | ICD-10-CM

## 2022-04-06 DIAGNOSIS — D259 Leiomyoma of uterus, unspecified: Secondary | ICD-10-CM | POA: Diagnosis not present

## 2022-04-06 DIAGNOSIS — O09522 Supervision of elderly multigravida, second trimester: Secondary | ICD-10-CM | POA: Insufficient documentation

## 2022-04-06 DIAGNOSIS — E038 Other specified hypothyroidism: Secondary | ICD-10-CM

## 2022-04-06 DIAGNOSIS — O99213 Obesity complicating pregnancy, third trimester: Secondary | ICD-10-CM

## 2022-04-06 DIAGNOSIS — O133 Gestational [pregnancy-induced] hypertension without significant proteinuria, third trimester: Secondary | ICD-10-CM

## 2022-04-06 DIAGNOSIS — O99282 Endocrine, nutritional and metabolic diseases complicating pregnancy, second trimester: Secondary | ICD-10-CM

## 2022-04-06 DIAGNOSIS — O09293 Supervision of pregnancy with other poor reproductive or obstetric history, third trimester: Secondary | ICD-10-CM

## 2022-04-06 DIAGNOSIS — O3412 Maternal care for benign tumor of corpus uteri, second trimester: Secondary | ICD-10-CM | POA: Diagnosis not present

## 2022-04-07 LAB — TSH RFX ON ABNORMAL TO FREE T4: TSH: 0.334 u[IU]/mL — ABNORMAL LOW (ref 0.450–4.500)

## 2022-04-07 LAB — T4F: T4,Free (Direct): 0.96 ng/dL (ref 0.82–1.77)

## 2022-04-16 ENCOUNTER — Ambulatory Visit (INDEPENDENT_AMBULATORY_CARE_PROVIDER_SITE_OTHER): Payer: Medicaid Other | Admitting: Obstetrics and Gynecology

## 2022-04-16 VITALS — BP 120/80 | HR 101 | Wt 297.0 lb

## 2022-04-16 DIAGNOSIS — O09522 Supervision of elderly multigravida, second trimester: Secondary | ICD-10-CM

## 2022-04-16 DIAGNOSIS — O09292 Supervision of pregnancy with other poor reproductive or obstetric history, second trimester: Secondary | ICD-10-CM

## 2022-04-16 DIAGNOSIS — O132 Gestational [pregnancy-induced] hypertension without significant proteinuria, second trimester: Secondary | ICD-10-CM

## 2022-04-16 DIAGNOSIS — E038 Other specified hypothyroidism: Secondary | ICD-10-CM | POA: Diagnosis not present

## 2022-04-16 DIAGNOSIS — O99212 Obesity complicating pregnancy, second trimester: Secondary | ICD-10-CM

## 2022-04-16 DIAGNOSIS — O09299 Supervision of pregnancy with other poor reproductive or obstetric history, unspecified trimester: Secondary | ICD-10-CM

## 2022-04-16 DIAGNOSIS — Z3A24 24 weeks gestation of pregnancy: Secondary | ICD-10-CM

## 2022-04-16 DIAGNOSIS — Z6841 Body Mass Index (BMI) 40.0 and over, adult: Secondary | ICD-10-CM

## 2022-04-16 DIAGNOSIS — O99210 Obesity complicating pregnancy, unspecified trimester: Secondary | ICD-10-CM

## 2022-04-16 DIAGNOSIS — R635 Abnormal weight gain: Secondary | ICD-10-CM

## 2022-04-16 NOTE — Progress Notes (Signed)
   PRENATAL VISIT NOTE  Subjective:  Tina Melton is a 37 y.o. [id number] at 38w1dbeing seen today for ongoing prenatal care.  She is currently monitored for the following issues for this high-risk pregnancy and has Obesity; BMI 40.0-44.9, adult (HGrant; Supervision of high risk pregnancy, antepartum; Pregnancy with history of ectopic pregnancy, antepartum; Advanced maternal age in multigravida; History of pre-eclampsia in prior pregnancy, currently pregnant; Transient hypertension of pregnancy in second trimester; and Subclinical hypothyroidism on their problem list.  Patient reports  weight gain .  Contractions: Not present. Vag. Bleeding: None.  Movement: Present. Denies leaking of fluid.   The following portions of the patient's history were reviewed and updated as appropriate: allergies, current medications, past family history, past medical history, past social history, past surgical history and problem list.   Objective:   Vitals:   04/16/22 1434  BP: 120/80  Pulse: (!) 101  Weight: 297 lb (134.7 kg)    Fetal Status: Fetal Heart Rate (bpm): 150   Movement: Present     General:  Alert, oriented and cooperative. Patient is in no acute distress.  Skin: Skin is warm and dry. No rash noted.   Cardiovascular: Normal heart rate noted  Respiratory: Normal respiratory effort, no problems with respiration noted  Abdomen: Soft, gravid, appropriate for gestational age.  Pain/Pressure: Absent     Pelvic: Cervical exam deferred        Extremities: Normal range of motion.     Mental Status: Normal mood and affect. Normal behavior. Normal judgment and thought content.   Assessment and Plan:  Pregnancy: [id number] 228w1d. Subclinical hypothyroidism On synthroid 25 - TSH + free T4  2. Weight gain Patient continues to work out 4x/week and eating at home more. She states that she gains about 70lbs during her pregnancies with LE swelling and then loses the weight afterwards. I told her to try  and reduce salt intake and she amenable to nutrition referral.   3. [redacted] weeks gestation of pregnancy GTT next visit  4. Multigravida of advanced maternal age in second trimester No issues Normal growth on 2/19 (71%, 579g). Continue with serial scans  5. History of pre-eclampsia in prior pregnancy, currently pregnant Hasn't started low dose asa. I told her I would still recommend it  6. Obesity in pregnancy, antepartum  7. BMI 40.0-44.9, adult (HCHatillo  8. Transient hypertension of pregnancy in second trimester Normal today  Preterm labor symptoms and general obstetric precautions including but not limited to vaginal bleeding, contractions, leaking of fluid and fetal movement were reviewed in detail with the patient. Please refer to After Visit Summary for other counseling recommendations.   No follow-ups on file.  Future Appointments  Date Time Provider DeWalterhill3/28/2024  8:15 AM CWH-WSCA LAB [facility]  05/14/2022  8:35 AM PiAletha HalimMD [facility]  05/18/2022 12:30 PM WMC[facility]MFC NURSE [facility]4/02/2022 12:45 PM WMC-MFC US4 [facility]MFCUS [facility]4/12/2022  3:30 PM PiAletha HalimMD [facility]  06/11/2022  3:30 PM Anyanwu, UgSallyanne HaversMD [facility]WSC[facility]  12/24/2022 10:00 AM Vaillancourt, SaAldona BarPA-C BUA-BUA None    ChAletha HalimMD

## 2022-04-17 LAB — TSH+FREE T4
Free T4: 0.91 ng/dL (ref 0.82–1.77)
TSH: 0.631 u[IU]/mL (ref 0.450–4.500)

## 2022-04-23 ENCOUNTER — Ambulatory Visit (INDEPENDENT_AMBULATORY_CARE_PROVIDER_SITE_OTHER): Payer: Medicaid Other | Admitting: Registered"

## 2022-04-23 ENCOUNTER — Encounter: Payer: Medicaid Other | Attending: Family Medicine | Admitting: Registered"

## 2022-04-23 DIAGNOSIS — E669 Obesity, unspecified: Secondary | ICD-10-CM | POA: Diagnosis not present

## 2022-04-23 DIAGNOSIS — Z713 Dietary counseling and surveillance: Secondary | ICD-10-CM | POA: Diagnosis not present

## 2022-04-23 DIAGNOSIS — Z6841 Body Mass Index (BMI) 40.0 and over, adult: Secondary | ICD-10-CM | POA: Diagnosis not present

## 2022-04-23 DIAGNOSIS — E119 Type 2 diabetes mellitus without complications: Secondary | ICD-10-CM | POA: Diagnosis present

## 2022-04-23 NOTE — Patient Instructions (Addendum)
Be sure you are including foods that will give you enough calcium and vitamin D for pregnancy (equivalent of 3 servings of dairy). Use handout for ideas. (Broccoli, Spinach, Almonds, etc, if you use almond milk look on label for calcium fortified) Consider switching to whole grain bread To help you remember to eat lunch brainstorm for ideas to remind you (set alarm, put lunch bag on the passenger seat, etc) Have a back-up plan for lunch if don't have time to eat a meal (protein shake)

## 2022-04-23 NOTE — Progress Notes (Signed)
Medical Nutrition Therapy  Appointment Start time:  [phone]  Appointment End time:  1008  Primary concerns today: Information on healthy diet/habits during pregnancy  Referral diagnosis: R63.5 Preferred learning style: no preference indicated Learning readiness: ready, change in progress  EDD: 08/05/2022; [redacted]w[redacted]d NUTRITION ASSESSMENT  Anthropometrics  Wt Readings from Last 3 Encounters:  04/16/22 297 lb (134.7 kg)  02/19/22 272 lb (123.4 kg)  01/14/22 264 lb (119.7 kg)   Medical Hx: pre-eclampsia in prior pregnancy Medications: synthroid (at end of Rx, no refills per Pt), aspirin (not taking) Labs: A1c 5.1 initial prenatal 01/14/22, TSH 0.334(L) Notable Signs/Symptoms: none  Lifestyle & Dietary Hx Pt reports prior to ectopic pregnancy she was very active and consciously making healthy dietary choices. Pt states after surgery was not able to exercise but has been able to get back into her routine. Pt states she has been working with a pPhysiological scientistx4 yrs. Pt states was hard at first to get into routine, but has become very enjoyable and energizing.  Pt states she mostly prepares food at home, loves vegetables. Pt states she has never eaten much dairy. Pt states she is not eating whole grains.  Pt has a regular routine to eat breakfast, usually too busy for a morning snack. Pt packs her lunch, usually left overs from night before, but may get very busy working and not think about eating until she is so hungry it is affecting her vision and having a very bad head ache. Dinner usually cooks balanced meals, but if feel bad from skipping lunch, may not eat much.  Pt report she gets very little sleep due to baby being active much of the night, may only get 4-5 hrs sleep.  Pt states her feet swell sometimes, but feels it mostly due to how long she is on her feet. 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  Estimated daily fluid intake: not assessed oz Supplements: not assessed Sleep: 4-5 hrs Stress / self-care: did not quantify stress (scale of 1-10) Current average weekly physical activity: 4x week  24-Hr Dietary Recall First Meal: 2-3 eggs, if fewer eggs will have tKuwaitsausage, and a carb (grits, oats, or fruit) Snack: none (too busy) Second Meal: green beans and hamburger (left over) Snack: apple and peanut butter Third Meal: green beans, corn, hamburger and gravy, mashed potatoes Snack: none Beverages: water, flavored water, zero sugar beverages  NUTRITION DIAGNOSIS  NB-1.1 Food and nutrition-related knowledge deficit As related to calcium recommendations for pregnancy.  As evidenced by dietary recall, stated gain of knowledge.  NUTRITION INTERVENTION  Nutrition education (E-1) on the following topics:  Dietary guidelines for pregnancy Timing of meals Options foods to meet needs  Handouts Provided Include  MyPlate for Moms Foods rich in CFort [place]for CR[place]for Change Teaching method utilized: Visual & Auditory  Demonstrated degree of understanding via: Teach Back  Barriers to learning/adherence to lifestyle change: none  Goals Established by Pt Be sure you are including foods that will give you enough calcium and vitamin D for pregnany (equivalent of 3 servings of dairy). Use handout for ideas. (Broccoli, Spinach, Almonds, etc, if you use almond milk look on label for calcium  fortified) Consider switching to whole grian bread To help you remember to eat lunch brainstorm for ideas to remind you (set alarm, put lunch bag on the passenger seat, etc) Have a back-up plan for lunch if don't have time to eat a meal (protein shake)    MONITORING & EVALUATION Dietary intake, weekly physical activity, and sleep in 2-3 weeks.

## 2022-05-07 ENCOUNTER — Other Ambulatory Visit: Payer: Medicaid Other

## 2022-05-13 ENCOUNTER — Ambulatory Visit (INDEPENDENT_AMBULATORY_CARE_PROVIDER_SITE_OTHER): Payer: Medicaid Other | Admitting: Family Medicine

## 2022-05-13 ENCOUNTER — Other Ambulatory Visit: Payer: Medicaid Other

## 2022-05-13 VITALS — BP 116/71 | HR 109 | Wt 296.0 lb

## 2022-05-13 DIAGNOSIS — Z1339 Encounter for screening examination for other mental health and behavioral disorders: Secondary | ICD-10-CM | POA: Diagnosis not present

## 2022-05-13 DIAGNOSIS — O0990 Supervision of high risk pregnancy, unspecified, unspecified trimester: Secondary | ICD-10-CM

## 2022-05-13 DIAGNOSIS — O09522 Supervision of elderly multigravida, second trimester: Secondary | ICD-10-CM

## 2022-05-13 DIAGNOSIS — O09523 Supervision of elderly multigravida, third trimester: Secondary | ICD-10-CM

## 2022-05-13 DIAGNOSIS — Z3A28 28 weeks gestation of pregnancy: Secondary | ICD-10-CM

## 2022-05-13 NOTE — Progress Notes (Signed)
ROB 28w  2 hr GTT today. T-Dap declined.  CC: None

## 2022-05-13 NOTE — Progress Notes (Signed)
   PRENATAL VISIT NOTE  Subjective:  Tina Melton is a 37 y.o. [id number] at [redacted]w[redacted]d being seen today for ongoing prenatal care.  She is currently monitored for the following issues for this high-risk pregnancy and has Obesity; BMI 35.0-35.9,adult; Supervision of high risk pregnancy, antepartum; Pregnancy with history of ectopic pregnancy, antepartum; Advanced maternal age in multigravida; History of pre-eclampsia in prior pregnancy, currently pregnant; Transient hypertension of pregnancy in second trimester; Subclinical hypothyroidism; and Nutritional counseling on their problem list.  Patient reports no complaints.  Contractions: Not present. Vag. Bleeding: None.  Movement: Present. Denies leaking of fluid.   The following portions of the patient's history were reviewed and updated as appropriate: allergies, current medications, past family history, past medical history, past social history, past surgical history and problem list.   Objective:   Vitals:   05/13/22 1022  BP: 116/71  Pulse: (!) 109  Weight: 296 lb (134.3 kg)    Fetal Status: Fetal Heart Rate (bpm): 159 Fundal Height: 29 cm Movement: Present     General:  Alert, oriented and cooperative. Patient is in no acute distress.  Skin: Skin is warm and dry. No rash noted.   Cardiovascular: Normal heart rate noted  Respiratory: Normal respiratory effort, no problems with respiration noted  Abdomen: Soft, gravid, appropriate for gestational age.  Pain/Pressure: Present     Pelvic: Cervical exam deferred        Extremities: Normal range of motion.  Edema: Moderate pitting, indentation subsides rapidly  Mental Status: Normal mood and affect. Normal behavior. Normal judgment and thought content.   Assessment and Plan:  Pregnancy: [id number] at [redacted]w[redacted]d 1. Supervision of high risk pregnancy, antepartum 28 week labs today  2. Multigravida of advanced maternal age in second trimester LR NIPT  Preterm labor symptoms and general obstetric  precautions including but not limited to vaginal bleeding, contractions, leaking of fluid and fetal movement were reviewed in detail with the patient. Please refer to After Visit Summary for other counseling recommendations.   Return in 2 weeks (on 05/27/2022).  Future Appointments  Date Time Provider [place]  05/18/2022 12:30 PM [facility] NURSE [facility]  05/18/2022 12:45 PM W[facility]-M[facility] US4 [facility]  05/26/2022  2:15 PM [facility]  05/28/2022 10:55 AM Aletha Halim, MD [facility]  06/11/2022 10:35 AM Anyanwu, Sallyanne Havers, MD [facility]  06/24/2022 10:35 AM Donnamae Jude, MD [facility]  07/09/2022 10:35 AM Anyanwu, Sallyanne Havers, MD [facility]  12/24/2022 10:00 AM Vaillancourt, Aldona Bar, PA-C BUA-BUA None    Donnamae Jude, MD

## 2022-05-14 ENCOUNTER — Other Ambulatory Visit: Payer: Medicaid Other

## 2022-05-14 ENCOUNTER — Encounter: Payer: Medicaid Other | Admitting: Obstetrics and Gynecology

## 2022-05-15 LAB — GLUCOSE TOLERANCE, 2 HOURS W/ 1HR
Glucose, 1 hour: 109 mg/dL (ref 70–179)
Glucose, 2 hour: 84 mg/dL (ref 70–152)
Glucose, Fasting: 74 mg/dL (ref 70–91)

## 2022-05-15 LAB — CBC
Hematocrit: 36.1 % (ref 34.0–46.6)
Hemoglobin: 12.2 g/dL (ref 11.1–15.9)
MCH: 27.9 pg (ref 26.6–33.0)
MCHC: 33.8 g/dL (ref 31.5–35.7)
MCV: 83 fL (ref 79–97)
Platelets: 311 10*3/uL (ref 150–450)
RBC: 4.37 x10E6/uL (ref 3.77–5.28)
RDW: 12.4 % (ref 11.7–15.4)
WBC: 7.6 10*3/uL (ref 3.4–10.8)

## 2022-05-15 LAB — HIV ANTIBODY (ROUTINE TESTING W REFLEX): HIV Screen 4th Generation wRfx: NONREACTIVE

## 2022-05-15 LAB — RPR: RPR Ser Ql: NONREACTIVE

## 2022-05-18 ENCOUNTER — Other Ambulatory Visit: Payer: Self-pay | Admitting: *Deleted

## 2022-05-18 ENCOUNTER — Ambulatory Visit: Payer: Medicaid Other | Attending: Obstetrics and Gynecology

## 2022-05-18 ENCOUNTER — Ambulatory Visit: Payer: Medicaid Other | Admitting: *Deleted

## 2022-05-18 VITALS — BP 132/65 | HR 103

## 2022-05-18 DIAGNOSIS — O09299 Supervision of pregnancy with other poor reproductive or obstetric history, unspecified trimester: Secondary | ICD-10-CM | POA: Diagnosis not present

## 2022-05-18 DIAGNOSIS — O133 Gestational [pregnancy-induced] hypertension without significant proteinuria, third trimester: Secondary | ICD-10-CM | POA: Insufficient documentation

## 2022-05-18 DIAGNOSIS — O0990 Supervision of high risk pregnancy, unspecified, unspecified trimester: Secondary | ICD-10-CM

## 2022-05-18 DIAGNOSIS — D259 Leiomyoma of uterus, unspecified: Secondary | ICD-10-CM

## 2022-05-18 DIAGNOSIS — O99213 Obesity complicating pregnancy, third trimester: Secondary | ICD-10-CM | POA: Diagnosis not present

## 2022-05-18 DIAGNOSIS — O99283 Endocrine, nutritional and metabolic diseases complicating pregnancy, third trimester: Secondary | ICD-10-CM | POA: Insufficient documentation

## 2022-05-18 DIAGNOSIS — O3413 Maternal care for benign tumor of corpus uteri, third trimester: Secondary | ICD-10-CM | POA: Diagnosis not present

## 2022-05-18 DIAGNOSIS — O09523 Supervision of elderly multigravida, third trimester: Secondary | ICD-10-CM | POA: Insufficient documentation

## 2022-05-18 DIAGNOSIS — E039 Hypothyroidism, unspecified: Secondary | ICD-10-CM | POA: Diagnosis not present

## 2022-05-18 DIAGNOSIS — O09293 Supervision of pregnancy with other poor reproductive or obstetric history, third trimester: Secondary | ICD-10-CM | POA: Insufficient documentation

## 2022-05-18 DIAGNOSIS — Z3A28 28 weeks gestation of pregnancy: Secondary | ICD-10-CM | POA: Diagnosis not present

## 2022-05-18 DIAGNOSIS — E669 Obesity, unspecified: Secondary | ICD-10-CM

## 2022-05-26 ENCOUNTER — Encounter: Payer: Medicaid Other | Attending: Family Medicine | Admitting: Registered"

## 2022-05-26 ENCOUNTER — Ambulatory Visit (INDEPENDENT_AMBULATORY_CARE_PROVIDER_SITE_OTHER): Payer: Medicaid Other | Admitting: Registered"

## 2022-05-26 DIAGNOSIS — Z6841 Body Mass Index (BMI) 40.0 and over, adult: Secondary | ICD-10-CM | POA: Diagnosis not present

## 2022-05-26 DIAGNOSIS — Z3A29 29 weeks gestation of pregnancy: Secondary | ICD-10-CM

## 2022-05-26 DIAGNOSIS — Z6835 Body mass index (BMI) 35.0-35.9, adult: Secondary | ICD-10-CM

## 2022-05-26 DIAGNOSIS — E669 Obesity, unspecified: Secondary | ICD-10-CM | POA: Insufficient documentation

## 2022-05-26 DIAGNOSIS — E119 Type 2 diabetes mellitus without complications: Secondary | ICD-10-CM | POA: Diagnosis present

## 2022-05-26 DIAGNOSIS — Z713 Dietary counseling and surveillance: Secondary | ICD-10-CM | POA: Insufficient documentation

## 2022-05-26 NOTE — Patient Instructions (Addendum)
Consider your dad's advice "let it be" so you aren't overwhelmed by so much to do.  Be in the bed by 9 pm 2 days per week. If waking up at 2 am hungry get a little something to eat. Avoid drinking caffeine after noon. Continue meal prep to help make sure you don't skip lunch.

## 2022-05-26 NOTE — Progress Notes (Signed)
Medical Nutrition Therapy follow-up Appointment Start time:  1440  Appointment End time:  1520  Primary concerns today: continue making healthy diet and lifestyle changes Referral diagnosis: R63.5 Preferred learning style: no preference indicated Learning readiness: ready, change in progress  EDD: 08/05/2022; [redacted]w[redacted]d  NUTRITION ASSESSMENT  Anthropometrics  Wt Readings from Last 3 Encounters:  05/13/22 296 lb (134.3 kg)  04/16/22 297 lb (134.7 kg)  02/19/22 272 lb (123.4 kg)   Medical Hx: pre-eclampsia in prior pregnancy Medications: synthroid (did not assess this visit), aspirin (not taking) Labs: A1c 5.1 initial prenatal 01/14/22, TSH 0.334(L) Notable Signs/Symptoms: frequent urination, inability to get adequate sleep  Lifestyle & Dietary Hx Pt has increased her dairy intake since last visit, including something such as yogurt, string cheese, OJ with calcium 2x/ week, pt states every meal has greens.   Pt was not eating whole grains last visit, buts since has bought whole grain bread and using for sandwiches and toast.  Pt states she has been including more afternoon snacks such as mixed berry regular yogurt, apples and peanut butter, oranges. This change is helping to reduce hunger at lunch and dinner.  Pt report she is getting less sleep due to baby being active and also wants to make sure things are done, household chores and paperwork work. Pt states she is OCD and wants to stay on a schedule of getting things done so she doesn't get overwhelmed.  Pt states her mother will be moving out in 2 weeks and states will have caregivers going in to help her and will take a lot of pressure off her.  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 Takes kids to sports and other activities (eg swimming) Get home around 6:30-7 and  cooks dinner  Estimated daily fluid intake: 5 L water per day Supplements: not assessed Sleep: 3-4 hrs Stress / self-care: did not quantify stress (scale of 1-10) Current average weekly physical activity: 2x week working with trainer instead of 4x week due to more feeling more tired.  24-Hr Dietary Recall First Meal: 3 boiled eggs Snack: yogurt  Second Meal: Brussels sprouts, chicken breast toast Snack: none Third Meal: chiicken and shrimp alfredo, broccoli, Caesar salad Snack: none Beverages: (too much sometimes) water, flavored water, zero sugar sodas occasionally, karma, no caffeine   NUTRITION DIAGNOSIS  NB-1.1 Food and nutrition-related knowledge deficit As related to calcium recommendations for pregnancy.  As evidenced by dietary recall, stated gain of knowledge.  NUTRITION INTERVENTION  Nutrition education (E-1) on the following topics:  Sleep Okay to eat at 2 am if hungry  Handouts Provided Include  none  Learning Style & Readiness for Change Teaching method utilized: Visual & Auditory  Demonstrated degree of understanding via: Teach Back  Barriers to learning/adherence to lifestyle change: none  Goals Established by Pt Consider your dad's advice "let it be" so you aren't overwhelmed by so much to do.  Be in the bed by 9 pm 2 days per week. If waking up at 2 am hungry get a little something to eat. Avoid drinking caffeine after noon. Continue meal prep to help make sure you don't skip lunch.   MONITORING & EVALUATION Dietary intake, weekly physical activity, and sleep in 4 weeks.

## 2022-05-28 ENCOUNTER — Inpatient Hospital Stay (HOSPITAL_BASED_OUTPATIENT_CLINIC_OR_DEPARTMENT_OTHER): Payer: Medicaid Other

## 2022-05-28 ENCOUNTER — Inpatient Hospital Stay (HOSPITAL_COMMUNITY)
Admission: AD | Admit: 2022-05-28 | Discharge: 2022-05-28 | Disposition: A | Discharge status: Home / Self Care | Payer: Medicaid Other | Attending: Obstetrics and Gynecology | Admitting: Obstetrics and Gynecology

## 2022-05-28 ENCOUNTER — Encounter: Payer: Self-pay | Admitting: Obstetrics and Gynecology

## 2022-05-28 ENCOUNTER — Ambulatory Visit (INDEPENDENT_AMBULATORY_CARE_PROVIDER_SITE_OTHER): Payer: Medicaid Other | Admitting: Obstetrics and Gynecology

## 2022-05-28 ENCOUNTER — Encounter (HOSPITAL_COMMUNITY): Payer: Self-pay | Admitting: Obstetrics and Gynecology

## 2022-05-28 ENCOUNTER — Encounter: Payer: Medicaid Other | Admitting: Obstetrics and Gynecology

## 2022-05-28 VITALS — BP 123/80 | HR 96 | Wt 307.0 lb

## 2022-05-28 DIAGNOSIS — Z3689 Encounter for other specified antenatal screening: Secondary | ICD-10-CM

## 2022-05-28 DIAGNOSIS — O368130 Decreased fetal movements, third trimester, not applicable or unspecified: Secondary | ICD-10-CM

## 2022-05-28 DIAGNOSIS — O99283 Endocrine, nutritional and metabolic diseases complicating pregnancy, third trimester: Secondary | ICD-10-CM

## 2022-05-28 DIAGNOSIS — O99213 Obesity complicating pregnancy, third trimester: Secondary | ICD-10-CM | POA: Insufficient documentation

## 2022-05-28 DIAGNOSIS — E038 Other specified hypothyroidism: Secondary | ICD-10-CM

## 2022-05-28 DIAGNOSIS — O09523 Supervision of elderly multigravida, third trimester: Secondary | ICD-10-CM

## 2022-05-28 DIAGNOSIS — Z3A30 30 weeks gestation of pregnancy: Secondary | ICD-10-CM

## 2022-05-28 DIAGNOSIS — O133 Gestational [pregnancy-induced] hypertension without significant proteinuria, third trimester: Secondary | ICD-10-CM

## 2022-05-28 DIAGNOSIS — D259 Leiomyoma of uterus, unspecified: Secondary | ICD-10-CM

## 2022-05-28 DIAGNOSIS — E039 Hypothyroidism, unspecified: Secondary | ICD-10-CM | POA: Insufficient documentation

## 2022-05-28 DIAGNOSIS — O09293 Supervision of pregnancy with other poor reproductive or obstetric history, third trimester: Secondary | ICD-10-CM | POA: Diagnosis not present

## 2022-05-28 DIAGNOSIS — O132 Gestational [pregnancy-induced] hypertension without significant proteinuria, second trimester: Secondary | ICD-10-CM

## 2022-05-28 DIAGNOSIS — O321XX0 Maternal care for breech presentation, not applicable or unspecified: Secondary | ICD-10-CM | POA: Insufficient documentation

## 2022-05-28 DIAGNOSIS — O3413 Maternal care for benign tumor of corpus uteri, third trimester: Secondary | ICD-10-CM

## 2022-05-28 DIAGNOSIS — Z6841 Body Mass Index (BMI) 40.0 and over, adult: Secondary | ICD-10-CM

## 2022-05-28 DIAGNOSIS — E669 Obesity, unspecified: Secondary | ICD-10-CM

## 2022-05-28 DIAGNOSIS — O288 Other abnormal findings on antenatal screening of mother: Secondary | ICD-10-CM

## 2022-05-28 DIAGNOSIS — O09299 Supervision of pregnancy with other poor reproductive or obstetric history, unspecified trimester: Secondary | ICD-10-CM

## 2022-05-28 MED ORDER — LEVOTHYROXINE SODIUM 25 MCG PO TABS: 25.0000 ug | ORAL_TABLET | Freq: Every day | ORAL | 0 refills | Status: DC
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
## 2022-06-11 ENCOUNTER — Encounter: Payer: Medicaid Other | Admitting: Obstetrics & Gynecology

## 2022-06-11 ENCOUNTER — Ambulatory Visit (INDEPENDENT_AMBULATORY_CARE_PROVIDER_SITE_OTHER): Payer: Medicaid Other | Admitting: Obstetrics & Gynecology

## 2022-06-11 VITALS — BP 137/82 | HR 100 | Wt 313.0 lb

## 2022-06-11 DIAGNOSIS — O99210 Obesity complicating pregnancy, unspecified trimester: Secondary | ICD-10-CM

## 2022-06-11 DIAGNOSIS — O0990 Supervision of high risk pregnancy, unspecified, unspecified trimester: Secondary | ICD-10-CM

## 2022-06-11 DIAGNOSIS — E038 Other specified hypothyroidism: Secondary | ICD-10-CM

## 2022-06-11 DIAGNOSIS — O09523 Supervision of elderly multigravida, third trimester: Secondary | ICD-10-CM

## 2022-06-11 DIAGNOSIS — Z3A32 32 weeks gestation of pregnancy: Secondary | ICD-10-CM

## 2022-06-11 NOTE — Progress Notes (Signed)
PRENATAL VISIT NOTE  Subjective:  Tina Melton is a 37 y.o. Z6X0960 at [redacted]w[redacted]d being seen today for ongoing prenatal care.  She is currently monitored for the following issues for this high-risk pregnancy and has Maternal morbid obesity, antepartum; BMI 40.0-44.9, adult; Supervision of high risk pregnancy, antepartum; Pregnancy with history of ectopic pregnancy, antepartum; Advanced maternal age in multigravida; Transient hypertension of pregnancy in second trimester; and Subclinical hypothyroidism on their problem list.  Patient reports no complaints.  Has not been taking Synthroid since February when she had normal thyroid labs, did not know she was supposed to continue. She says she feels better since she stpped it, felt the medication was causing hair loss which stopped.  Contractions: Irritability. Vag. Bleeding: None.  Movement: Present. Denies leaking of fluid.   The following portions of the patient's history were reviewed and updated as appropriate: allergies, current medications, past family history, past medical history, past social history, past surgical history and problem list.   Objective:   Vitals:   06/11/22 1104  BP: 137/82  Pulse: 100  Weight: (!) 313 lb (142 kg)    Fetal Status: Fetal Heart Rate (bpm): 152   Movement: Present     General:  Alert, oriented and cooperative. Patient is in no acute distress.  Skin: Skin is warm and dry. No rash noted.   Cardiovascular: Normal heart rate noted  Respiratory: Normal respiratory effort, no problems with respiration noted  Abdomen: Soft, gravid, appropriate for gestational age.  Pain/Pressure: Present     Pelvic: Cervical exam deferred        Extremities: Normal range of motion.  Edema: Deep pitting, indentation remains for a short time  Mental Status: Normal mood and affect. Normal behavior. Normal judgment and thought content.   Imaging: [place] MFM FETAL BPP WO NON STRESS  Result Date:  05/28/2022 ----------------------------------------------------------------------  OBSTETRICS REPORT                       (Signed Final 05/28/2022 04:31 pm) ---------------------------------------------------------------------- Patient Info  ID #:       [id number]                          D.O.B.:  11-Jul-1985 (36 yrs)  Name:       Tina Melton                  Visit Date: 05/28/2022 01:51 pm ---------------------------------------------------------------------- Performed By  Attending:        Braxton Feathers DO       Ref. Address:     [address]                                                             [address]  Performed By:     Percell Boston          Secondary Phy.:   [facility] MAU/Triage                    RDMS  Referred By:      [facility]       [facility] ---------------------------------------------------------------------- Orders  #  Description                           Code        Ordered By  1  [place] MFM FETAL BPP WO NON               76819.01    [facility]SY[facility]     STRESS ----------------------------------------------------------------------  #  Order #                     Accession #                Episode #  1  [id number]                   [id number]                 [id number] ---------------------------------------------------------------------- Indications  [redacted] weeks gestation of pregnancy                Z3A.30  Decreased fetal movements, third trimester,    O36.8130  unspecified  Obesity complicating pregnancy, third          O99.213  trimester  Advanced maternal age multigravida 53+,        O60.523  third trimester (50yr)  Medical complication of pregnancy              O26.90  (Transient Hypertension)  Uterine fibroids                               O34.10  Hypothyroid (Subclinical) on Synthroid         O99.280 E03.9  Poor obstetric history: Previous               O09.299  preeclampsia / eclampsia/gestational  HTN  Low Risk NIPS(Negative AFP)(Negative  Horizon) ---------------------------------------------------------------------- Fetal Evaluation  Num Of Fetuses:         1  Fetal Heart Rate(bpm):  143  Cardiac Activity:       Observed  Presentation:           Breech  Placenta:               Anterior Fundal  Amniotic Fluid  AFI FV:      Within normal limits  AFI Sum(cm)     %Tile       Largest Pocket(cm)  15.6            56          4.5  RUQ(cm)       RLQ(cm)       LUQ(cm)        LLQ(cm)  4.5           2.5           4.5            4.1 ---------------------------------------------------------------------- Biophysical Evaluation  Amniotic F.V:   Within normal limits       F. Tone:        Observed  F. Movement:    Observed                   Score:          8/8  F. Breathing:   Observed ---------------------------------------------------------------------- OB History  Gravidity:    9  Term:   2        Prem:   0        SAB:   1  TOP:          4       Ectopic:  1        Living: 2 ---------------------------------------------------------------------- Gestational Age  LMP:           30w 1d        Date:  10/29/21                  EDD:   08/05/22  Best:          30w 1d     Det. By:  LMP  (10/29/21)          EDD:   08/05/22 ---------------------------------------------------------------------- Anatomy  Cranium:               Appears normal         Kidneys:                Appear normal  Thoracic:              Appears normal         Bladder:                Appears normal  Stomach:               Appears normal, left                         sided ---------------------------------------------------------------------- Cervix Uterus Adnexa  Cervix  Not visualized (advanced GA >24wks) ---------------------------------------------------------------------- Comments  Hospital Ultrasound  30w 1d at the MAU for decreased fetal movement. EDD:  08/05/2022 by LMP  (10/29/21).  Sonographic findings  Single intrauterine pregnancy.  Fetal cardiac  activity: Observed and appears normal.  Presentation: Breech.  Limited fetal anatomy appears normal.  Amniotic fluid volume: Within normal limits. AFI: 15.6 cm.  MVP: 4.5 cm.  Placenta: Anterior Fundal.  BPP: 8/8.  Recommendations  - Continue clinical management per OB provider  This was a limited ultrasound with a remote read. If an official  MFM consult is requested for any reason please call/place an  order in Epic. ----------------------------------------------------------------------                  Braxton Feathers, DO Electronically Signed Final Report   05/28/2022 04:31 pm ----------------------------------------------------------------------  [place] MFM OB FOLLOW UP  Result Date: 05/18/2022 ----------------------------------------------------------------------  OBSTETRICS REPORT                       (Signed Final 05/18/2022 01:27 pm) ---------------------------------------------------------------------- Patient Info  ID #:       [id number]                          D.O.B.:  [birth year] (36 yrs)  Name:       Tina Melton                  Visit Date: 05/18/2022 12:46 pm ---------------------------------------------------------------------- Performed By  Attending:        Ma Rings MD         Ref. Address:     [address]  Performed By:     Anabel Halon          [facility]  Referred By:      [facility] ---------------------------------------------------------------------- Orders  #  Description                           Code        Ordered By  1  [place] MFM OB FOLLOW UP                   [phone]    Noralee Space ----------------------------------------------------------------------  #  Order #                      Accession #                Episode #  1  [id number]                   [id number]                 [id number] ---------------------------------------------------------------------- Indications  Obesity complicating pregnancy, third          O99.213  trimester  Advanced maternal age multigravida 91+,        O58.523  third trimester (51yr)  Medical complication of pregnancy              O26.90  (Transient Hypertension)  Uterine fibroids                               O34.10  Hypothyroid (Subclinical) on Synthroid         O99.280 E03.9  Encounter for other antenatal screening        Z36.2  follow-up  [redacted] weeks gestation of pregnancy                Z3A.28  Poor obstetric history: Previous               O09.299  preeclampsia / eclampsia/gestational HTN  Low Risk NIPS(Negative AFP)(Negative  Horizon) ---------------------------------------------------------------------- Fetal Evaluation  Num Of Fetuses:         1  Fetal Heart Rate(bpm):  164  Cardiac Activity:       Observed  Presentation:  Cephalic  Placenta:               Anterior Fundal  P. Cord Insertion:      Previously visualized  Amniotic Fluid  AFI FV:      Within normal limits  AFI Sum(cm)     %Tile       Largest Pocket(cm)  17.48           66          4.9  RUQ(cm)       RLQ(cm)       LUQ(cm)        LLQ(cm)  4.28          4.9           4.1            4.2 ---------------------------------------------------------------------- Biometry  BPD:      78.8  mm     G. Age:  31w 4d         98  %    CI:        73.96   %    70 - 86                                                          FL/HC:      18.6   %    19.6 - 20.8  HC:       291   mm     G. Age:  32w 0d         97  %    HC/AC:      1.12        0.99 - 1.21  AC:      259.5  mm     G. Age:  30w 1d         82  %    FL/BPD:     68.8   %    71 - 87  FL:       54.2  mm     G. Age:  28w 5d         34  %    FL/AC:      20.9   %    20 - 24  Est. FW:    1483  gm      3 lb 4 oz     82  %  ---------------------------------------------------------------------- OB History  Gravidity:    9         Term:   2        Prem:   0        SAB:   1  TOP:          4       Ectopic:  1        Living: 2 ---------------------------------------------------------------------- Gestational Age  LMP:           28w 5d        Date:  10/29/21                  EDD:   08/05/22  U/S Today:     30w 4d  EDD:   07/23/22  Best:          28w 5d     Det. By:  LMP  (10/29/21)          EDD:   08/05/22 ---------------------------------------------------------------------- Anatomy  Cranium:               Appears normal         LVOT:                   Appears normal  Cavum:                 Previously             Aortic Arch:            Previously seen                         visualized  Ventricles:            Previously seen        Ductal Arch:            Appears normal  Choroid Plexus:        Previously seen        Diaphragm:              Appears normal  Cerebellum:            Previously seen        Stomach:                Appears normal, left                                                                        sided  Posterior Fossa:       Previously seen        Abdomen:                Previously seen  Nuchal Fold:           Previously seen        Abdominal Wall:         Previously seen  Face:                  Orbits and profile     Cord Vessels:           Previously seen                         previously seen  Lips:                  Previously seen        Kidneys:                Appear normal  Palate:                Not well visualized    Bladder:                Appears normal  Thoracic:              Previously seen        Spine:  Previously seen  Heart:                 Previously seen        Upper Extremities:      Previously seen  RVOT:                  Previously seen        Lower Extremities:      Previously seen  Other:  Hands prev visualized. Nasal bone prev visualized. VC, 3VV  and          3VTV prev visualized. Technically difficult due to maternal habitus          and fetal position. ---------------------------------------------------------------------- Cervix Uterus Adnexa  Cervix  Not visualized (advanced GA >24wks)  Uterus  No abnormality visualized.  Right Ovary  Not visualized.  Left Ovary  Not visualized.  Cul De Sac  No free fluid seen.  Adnexa  No abnormality visualized ---------------------------------------------------------------------- Comments  This patient was seen for a follow up growth scan due to  advanced maternal age, hypothyroidism, and maternal  obesity with a BMI of 36.  She denies any problems since her  last exam and has screened negative for gestational  diabetes.  She was informed that the fetal growth and amniotic fluid  level appears appropriate for her gestational age.  Due to her underlying medical conditions, a follow-up growth  scan was scheduled in 5 weeks. ----------------------------------------------------------------------                  Ma Rings, MD Electronically Signed Final Report   05/18/2022 01:27 pm ----------------------------------------------------------------------   Assessment and Plan:  Pregnancy: Z6X0960 at [redacted]w[redacted]d 1. Maternal morbid obesity, antepartum TWG 53 lbs, Body mass index is 43.65 kg/m.   Next growth scan is on 06/22/2022.  2. Subclinical hypothyroidism Will labs today and ascertain need for further therapy. - TSH + free T4  3. Multigravida of advanced maternal age in third trimester 4. [redacted] weeks gestation of pregnancy 5. Supervision of high risk pregnancy, antepartum No concerns. Preterm labor symptoms and general obstetric precautions including but not limited to vaginal bleeding, contractions, leaking of fluid and fetal movement were reviewed in detail with the patient. Please refer to After Visit Summary for other counseling recommendations.   Return in about 2 weeks (around 06/25/2022) for OFFICE OB VISIT (MD  only).  Future Appointments  Date Time Provider Department Center  06/22/2022 10:45 AM [facility] NURSE [facility]  06/22/2022 11:00 AM [facility] US1 [facility]  06/23/2022  3:15 PM [facility]  06/24/2022 10:35 AM Reva Bores, MD [facility]  07/09/2022 10:35 AM Daveon Arpino, Jethro Bastos, MD [facility]  07/16/2022 10:15 AM Calvert Cantor, CNM [facility]  07/23/2022 10:35 AM Reva Bores, MD [facility]  07/30/2022 10:55 AM Calvert Cantor, CNM [facility]  12/24/2022 10:00 AM Vaillancourt, Lelon Mast, PA-C [facility] None    Jaynie Collins, MD

## 2022-06-11 NOTE — Patient Instructions (Signed)
Return to office for any scheduled appointments. Call the office or go to the MAU at [facility] if: You begin to have strong, frequent contractions Your water breaks.  Sometimes it is a big gush of fluid, sometimes it is just a trickle that keeps getting your underwear wet or running down your legs You have vaginal bleeding.  It is normal to have a small amount of spotting if your cervix was checked.  You do not feel your baby moving like normal.  If you do not, get something to eat and drink and lay down and focus on feeling your baby move.   If your baby is still not moving like normal, you should call the office or go to MAU. Any other obstetric concerns.  

## 2022-06-12 LAB — TSH+FREE T4
Free T4: 0.89 ng/dL (ref 0.82–1.77)
TSH: 0.701 u[IU]/mL (ref 0.450–4.500)

## 2022-06-22 ENCOUNTER — Other Ambulatory Visit: Payer: Self-pay | Admitting: *Deleted

## 2022-06-22 ENCOUNTER — Ambulatory Visit: Payer: Medicaid Other | Admitting: *Deleted

## 2022-06-22 ENCOUNTER — Ambulatory Visit: Payer: Medicaid Other | Attending: Obstetrics

## 2022-06-22 VITALS — BP 134/88 | HR 92

## 2022-06-22 DIAGNOSIS — O0990 Supervision of high risk pregnancy, unspecified, unspecified trimester: Secondary | ICD-10-CM | POA: Insufficient documentation

## 2022-06-22 DIAGNOSIS — E039 Hypothyroidism, unspecified: Secondary | ICD-10-CM | POA: Diagnosis not present

## 2022-06-22 DIAGNOSIS — O99283 Endocrine, nutritional and metabolic diseases complicating pregnancy, third trimester: Secondary | ICD-10-CM | POA: Diagnosis not present

## 2022-06-22 DIAGNOSIS — Z3A33 33 weeks gestation of pregnancy: Secondary | ICD-10-CM

## 2022-06-22 DIAGNOSIS — E669 Obesity, unspecified: Secondary | ICD-10-CM

## 2022-06-22 DIAGNOSIS — O09523 Supervision of elderly multigravida, third trimester: Secondary | ICD-10-CM

## 2022-06-22 DIAGNOSIS — D259 Leiomyoma of uterus, unspecified: Secondary | ICD-10-CM | POA: Diagnosis not present

## 2022-06-22 DIAGNOSIS — O3413 Maternal care for benign tumor of corpus uteri, third trimester: Secondary | ICD-10-CM | POA: Diagnosis not present

## 2022-06-22 DIAGNOSIS — O99213 Obesity complicating pregnancy, third trimester: Secondary | ICD-10-CM

## 2022-06-22 DIAGNOSIS — O09293 Supervision of pregnancy with other poor reproductive or obstetric history, third trimester: Secondary | ICD-10-CM

## 2022-06-22 DIAGNOSIS — O133 Gestational [pregnancy-induced] hypertension without significant proteinuria, third trimester: Secondary | ICD-10-CM | POA: Diagnosis not present

## 2022-06-23 ENCOUNTER — Ambulatory Visit (INDEPENDENT_AMBULATORY_CARE_PROVIDER_SITE_OTHER): Payer: Medicaid Other | Admitting: Registered"

## 2022-06-23 ENCOUNTER — Encounter: Payer: Medicaid Other | Attending: Obstetrics and Gynecology | Admitting: Registered"

## 2022-06-23 ENCOUNTER — Other Ambulatory Visit: Payer: Self-pay

## 2022-06-23 DIAGNOSIS — Z6841 Body Mass Index (BMI) 40.0 and over, adult: Secondary | ICD-10-CM | POA: Diagnosis not present

## 2022-06-23 DIAGNOSIS — E669 Obesity, unspecified: Secondary | ICD-10-CM | POA: Diagnosis not present

## 2022-06-23 DIAGNOSIS — Z3A33 33 weeks gestation of pregnancy: Secondary | ICD-10-CM

## 2022-06-23 DIAGNOSIS — E119 Type 2 diabetes mellitus without complications: Secondary | ICD-10-CM | POA: Diagnosis present

## 2022-06-23 DIAGNOSIS — Z713 Dietary counseling and surveillance: Secondary | ICD-10-CM | POA: Diagnosis not present

## 2022-06-23 NOTE — Progress Notes (Signed)
Medical Nutrition Therapy follow-up Appointment Start time:  1530  Appointment End time:  1610  Primary concerns today: continue making healthy diet and lifestyle changes Referral diagnosis: R63.5 Preferred learning style: no preference indicated Learning readiness: ready, change in progress  EDD: 08/05/2022; [redacted]w[redacted]d  NUTRITION ASSESSMENT  Anthropometrics  Wt Readings from Last 3 Encounters:  06/11/22 (!) 313 lb (142 kg)  05/28/22 (!) 307 lb (139.3 kg)  05/13/22 296 lb (134.3 kg)   04/16/22 297 lb (134.7 kg)  02/19/22 272 lb (123.4 kg)   Medical Hx: pre-eclampsia in prior pregnancy Medications: synthroid (not taking), aspirin (not taking) Labs: A1c 5.1 initial prenatal 01/14/22, TSH 0.334(L) Notable Signs/Symptoms: inability to get adequate sleep  Lifestyle & Dietary Hx Pt has decreased meat intake due to not having craving for it. Pt states she includes other sources of protein such as beans in her protein bowls with a lot of variety of vegetables.  Pt report baby is still active most of the night and sleeps for hours during the day. Pt states at different visits her providers have expressed concern about not a lot of fetal movement, but patient is not concerned because she feels the baby is very active.   Pt states the baby is moving a lot around 4 in the morning and since pt becomes wide awake she will work on her computer for a couple of hours to earn money. Then takes son to school, comes home and gets ready for work and eats breakfast.   Today we talked about different ways she can manage her time and help the baby calm down with eating breakfast earlier. Pt states she will consider ways she can change her time management to balance her work and home responsibilities.    Pt states her mother has not moved out yet, Pt states either this weekend or next weekend her dad plans to help her move her mother's things into the new place.  (First visit) Pt reports the following  schedule: 4:30 am gets up to go workout 2 days a week with a trainer, other days starts work on her computer. 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 Takes kids to sports and other activities (eg swimming) Get home around 6:30-7 and cooks dinner, was cooking nightly now a few days a week.  Estimated daily fluid intake: "a lot" of  water Supplements: not assessed Sleep: 3-4 hrs Stress / self-care: did not quantify stress (scale of 1-10) Current average weekly physical activity: 2x week working with trainer Wed & Fri. Would like to do more but feels drained even with just 2x/week.  24-Hr Dietary Recall First Meal: 3 eggs, rice, cheese Snack: 2 string cheese  Second Meal: chicken quesadilla, small can pepsi Snack: none Third Meal: 6 pm sub sandwich Snack: none Beverages: water, mini soda for pick-me-up  NUTRITION DIAGNOSIS  NB-1.1 Food and nutrition-related knowledge deficit As related to calcium recommendations for pregnancy.  As evidenced by dietary recall, stated gain of knowledge.  NUTRITION INTERVENTION  Nutrition education (E-1) on the following topics:  Brainstorming of  ways to get more rest Saving leftovers for lunch  Handouts Provided Include  none  Learning Style & Readiness for Change Teaching method utilized: Visual & Auditory  Demonstrated degree of understanding via: Teach Back  Barriers to learning/adherence to lifestyle change: none  Goals Established by Pt When dishing up your dinner, also put some  away for lunch before you sit down to eat. Take advantage of moments when you can take naps. Go to bed with the intention that you need more rest and how to you can change up your schedule to help you get more rest. Consider having other drinks beside soda for pick-me-up, and getting more rest so you won't need sodas for energy.   MONITORING & EVALUATION Dietary intake, weekly  physical activity, and sleep in 5 weeks.

## 2022-06-23 NOTE — Patient Instructions (Addendum)
When dishing up your dinner, also put some away for lunch before you sit down to eat. Take advantage of moments when you can take naps. Go to bed with the intention that you need more rest and how to you can change up your schedule to help you get more rest. Consider having other drinks beside soda for pick-me-up, and getting more rest so you won't need sodas for energy.

## 2022-06-24 ENCOUNTER — Ambulatory Visit (INDEPENDENT_AMBULATORY_CARE_PROVIDER_SITE_OTHER): Payer: Medicaid Other | Admitting: Family Medicine

## 2022-06-24 VITALS — BP 139/89 | HR 99 | Wt 314.0 lb

## 2022-06-24 DIAGNOSIS — O132 Gestational [pregnancy-induced] hypertension without significant proteinuria, second trimester: Secondary | ICD-10-CM

## 2022-06-24 DIAGNOSIS — O0990 Supervision of high risk pregnancy, unspecified, unspecified trimester: Secondary | ICD-10-CM

## 2022-06-24 DIAGNOSIS — O09523 Supervision of elderly multigravida, third trimester: Secondary | ICD-10-CM

## 2022-06-24 NOTE — Progress Notes (Signed)
ROB  Will repeat b/P  Patient reports increase in swelling and pain and pressure.   Denies any HA's or visual changes.

## 2022-06-24 NOTE — Progress Notes (Signed)
   PRENATAL VISIT NOTE  Subjective:  Tina Melton is a 37 y.o. [id number] at [redacted]w[redacted]d being seen today for ongoing prenatal care.  She is currently monitored for the following issues for this high-risk pregnancy and has Maternal morbid obesity, antepartum (HCC); BMI 40.0-44.9, adult (HCC); Supervision of high risk pregnancy, antepartum; Pregnancy with history of ectopic pregnancy, antepartum; Advanced maternal age in multigravida; Transient hypertension of pregnancy in second trimester; Subclinical hypothyroidism; and Nutritional counseling on their problem list.  Patient reports  hand swelling .  Contractions: Irregular. Vag. Bleeding: None.  Movement: Present. Denies leaking of fluid.   The following portions of the patient's history were reviewed and updated as appropriate: allergies, current medications, past family history, past medical history, past social history, past surgical history and problem list.   Objective:   Vitals:   06/24/22 1038 06/24/22 1130  BP: (!) 138/91 139/89  Pulse: (!) 102 99  Weight: (!) 314 lb (142.4 kg)     Fetal Status: Fetal Heart Rate (bpm): 156   Movement: Present     General:  Alert, oriented and cooperative. Patient is in no acute distress.  Skin: Skin is warm and dry. No rash noted.   Cardiovascular: Normal heart rate noted  Respiratory: Normal respiratory effort, no problems with respiration noted  Abdomen: Soft, gravid, appropriate for gestational age.  Pain/Pressure: Present     Pelvic: Cervical exam deferred        Extremities: Normal range of motion.  Edema: Very deep pitting, indentation lasts a long time  Mental Status: Normal mood and affect. Normal behavior. Normal judgment and thought content.   Assessment and Plan:  Pregnancy: [id number] at [redacted]w[redacted]d 1. Supervision of high risk pregnancy, antepartum Continue prenatal care.  2. Multigravida of advanced maternal age in third trimester For IOL @ 39 weeks In antenatal testing due to BMI starting  next week Last growth @ 32%  3. Transient hypertension of pregnancy in third trimester Given mild range BP and hand swelling, check labs Warning signs reviewed. - CBC - Comprehensive metabolic panel - Protein / creatinine ratio, urine  Preterm labor symptoms and general obstetric precautions including but not limited to vaginal bleeding, contractions, leaking of fluid and fetal movement were reviewed in detail with the patient. Please refer to After Visit Summary for other counseling recommendations.   Return in 2 weeks (on 07/08/2022).  Future Appointments  Date Time Provider Department Center  06/29/2022 10:30 AM [facility] NURSE [facility]  06/29/2022 10:45 AM [facility] NST [facility]  07/06/2022 10:30 AM [facility] NURSE [facility]  07/06/2022 10:45 AM [facility] NST [facility]  07/08/2022 10:35 AM Reva Bores, MD [facility]  07/15/2022 10:55 AM Reva Bores, MD [facility]  07/16/2022 10:15 AM Calvert Cantor, CNM [facility]  07/17/2022  1:00 PM [facility] NURSE [facility] [place]  07/17/2022  1:15 PM [facility] NST [facility]  07/20/2022  1:30 PM [facility] NURSE [facility]  07/20/2022  1:45 PM [facility] US4 [facility]  07/23/2022 10:35 AM Reva Bores, MD [facility]  07/27/2022 10:30 AM [facility] NURSE [facility]  07/27/2022 10:45 AM [facility] NST [facility]  07/28/2022  3:15 PM Carolan Shiver, RD [facility]  07/29/2022 10:35 AM [place] Bing, MD [facility]  07/30/2022 10:55 AM Calvert Cantor, CNM [facility]  12/24/2022 10:00 AM Vaillancourt, Lelon Mast, PA-C [facility] None    Reva Bores, MD

## 2022-06-25 LAB — CBC
Hematocrit: 34.7 % (ref 34.0–46.6)
Hemoglobin: 11.4 g/dL (ref 11.1–15.9)
MCH: 26.3 pg — ABNORMAL LOW (ref 26.6–33.0)
MCHC: 32.9 g/dL (ref 31.5–35.7)
MCV: 80 fL (ref 79–97)
Platelets: 305 10*3/uL (ref 150–450)
RBC: 4.34 x10E6/uL (ref 3.77–5.28)
RDW: 12.7 % (ref 11.7–15.4)
WBC: 10.3 10*3/uL (ref 3.4–10.8)

## 2022-06-25 LAB — COMPREHENSIVE METABOLIC PANEL
ALT: 13 IU/L (ref 0–32)
AST: 13 IU/L (ref 0–40)
Albumin/Globulin Ratio: 1.2 (ref 1.2–2.2)
Albumin: 3.3 g/dL — ABNORMAL LOW (ref 3.9–4.9)
Alkaline Phosphatase: 130 IU/L — ABNORMAL HIGH (ref 44–121)
BUN/Creatinine Ratio: 20 (ref 9–23)
BUN: 11 mg/dL (ref 6–20)
Bilirubin Total: 0.3 mg/dL (ref 0.0–1.2)
CO2: 19 mmol/L — ABNORMAL LOW (ref 20–29)
Calcium: 8.8 mg/dL (ref 8.7–10.2)
Chloride: 104 mmol/L (ref 96–106)
Creatinine, Ser: 0.55 mg/dL — ABNORMAL LOW (ref 0.57–1.00)
Globulin, Total: 2.8 g/dL (ref 1.5–4.5)
Glucose: 95 mg/dL (ref 70–99)
Potassium: 3.9 mmol/L (ref 3.5–5.2)
Sodium: 136 mmol/L (ref 134–144)
Total Protein: 6.1 g/dL (ref 6.0–8.5)
eGFR: 120 mL/min/{1.73_m2} (ref >59)

## 2022-06-25 LAB — PROTEIN / CREATININE RATIO, URINE
Creatinine, Urine: 132.1 mg/dL
Protein, Ur: 16.8 mg/dL
Protein/Creat Ratio: 127 mg/g creat (ref 0–200)

## 2022-06-29 ENCOUNTER — Ambulatory Visit: Payer: Medicaid Other | Admitting: *Deleted

## 2022-06-29 ENCOUNTER — Ambulatory Visit: Payer: Medicaid Other | Attending: Maternal & Fetal Medicine | Admitting: *Deleted

## 2022-06-29 VITALS — BP 143/92 | HR 99

## 2022-06-29 DIAGNOSIS — O99280 Endocrine, nutritional and metabolic diseases complicating pregnancy, unspecified trimester: Secondary | ICD-10-CM | POA: Insufficient documentation

## 2022-06-29 DIAGNOSIS — E039 Hypothyroidism, unspecified: Secondary | ICD-10-CM | POA: Insufficient documentation

## 2022-06-29 DIAGNOSIS — O0990 Supervision of high risk pregnancy, unspecified, unspecified trimester: Secondary | ICD-10-CM

## 2022-06-29 DIAGNOSIS — O10919 Unspecified pre-existing hypertension complicating pregnancy, unspecified trimester: Secondary | ICD-10-CM | POA: Diagnosis not present

## 2022-06-29 DIAGNOSIS — O10913 Unspecified pre-existing hypertension complicating pregnancy, third trimester: Secondary | ICD-10-CM

## 2022-06-29 DIAGNOSIS — Z3A34 34 weeks gestation of pregnancy: Secondary | ICD-10-CM

## 2022-06-29 NOTE — Procedures (Signed)
Tina Melton 1985/03/25 109w5d  Fetus A Non-Stress Test Interpretation for 06/29/22  Indication: Chronic Hypertenstion  Fetal Heart Rate A Mode: External Baseline Rate (A): 160 bpm Variability: Moderate Accelerations: 15 x 15 Decelerations: None Multiple birth?: No  Uterine Activity Mode: Palpation, Toco Contraction Frequency (min): None  Interpretation (Fetal Testing) Nonstress Test Interpretation: Reactive Comments: Fetus very active. Patient states fetus is moving almost constantly. Dr. Parke Poisson reviewed tracing.

## 2022-07-02 ENCOUNTER — Ambulatory Visit: Payer: Medicaid Other

## 2022-07-02 ENCOUNTER — Ambulatory Visit: Payer: Medicaid Other | Attending: Obstetrics | Admitting: *Deleted

## 2022-07-02 VITALS — BP 138/86 | HR 91

## 2022-07-02 DIAGNOSIS — Z3A35 35 weeks gestation of pregnancy: Secondary | ICD-10-CM

## 2022-07-02 DIAGNOSIS — O09523 Supervision of elderly multigravida, third trimester: Secondary | ICD-10-CM

## 2022-07-02 DIAGNOSIS — O99213 Obesity complicating pregnancy, third trimester: Secondary | ICD-10-CM | POA: Diagnosis not present

## 2022-07-02 DIAGNOSIS — E039 Hypothyroidism, unspecified: Secondary | ICD-10-CM | POA: Diagnosis not present

## 2022-07-02 DIAGNOSIS — O99283 Endocrine, nutritional and metabolic diseases complicating pregnancy, third trimester: Secondary | ICD-10-CM | POA: Diagnosis not present

## 2022-07-02 DIAGNOSIS — O0990 Supervision of high risk pregnancy, unspecified, unspecified trimester: Secondary | ICD-10-CM

## 2022-07-02 NOTE — Procedures (Signed)
Tina Melton December 02, 1985 [redacted]w[redacted]d  Fetus A Non-Stress Test Interpretation for 07/02/22  Indication:  morbidly obese, Hypothyroid  Fetal Heart Rate A Mode: External Baseline Rate (A): 145 bpm Variability: Moderate Accelerations: 15 x 15 Decelerations: None Multiple birth?: No  Uterine Activity Mode: Toco Contraction Frequency (min): none Resting Tone Palpated: Relaxed  Interpretation (Fetal Testing) Nonstress Test Interpretation: Reactive Overall Impression: Reassuring for gestational age Comments: Tracing reviewed byDr. Parke Poisson

## 2022-07-06 ENCOUNTER — Ambulatory Visit: Payer: Medicaid Other

## 2022-07-06 ENCOUNTER — Ambulatory Visit: Payer: Medicaid Other | Attending: Maternal & Fetal Medicine | Admitting: *Deleted

## 2022-07-06 VITALS — BP 137/88 | HR 81

## 2022-07-06 DIAGNOSIS — O0990 Supervision of high risk pregnancy, unspecified, unspecified trimester: Secondary | ICD-10-CM

## 2022-07-06 DIAGNOSIS — O99213 Obesity complicating pregnancy, third trimester: Secondary | ICD-10-CM | POA: Insufficient documentation

## 2022-07-06 DIAGNOSIS — O10013 Pre-existing essential hypertension complicating pregnancy, third trimester: Secondary | ICD-10-CM | POA: Diagnosis not present

## 2022-07-06 DIAGNOSIS — O09523 Supervision of elderly multigravida, third trimester: Secondary | ICD-10-CM

## 2022-07-06 DIAGNOSIS — Z3A35 35 weeks gestation of pregnancy: Secondary | ICD-10-CM | POA: Insufficient documentation

## 2022-07-06 NOTE — Procedures (Cosign Needed)
Tina Melton [birth year] [redacted]w[redacted]d  Fetus A Non-Stress Test Interpretation for 07/06/22  Indication:  Obesity, Hypothyroidism, Probable CHTN  Fetal Heart Rate A Mode: External Baseline Rate (A): 150 bpm Variability: Moderate Accelerations: 15 x 15 Decelerations: None Multiple birth?: No  Uterine Activity Mode: Palpation, Toco Contraction Frequency (min): None  Interpretation (Fetal Testing) Nonstress Test Interpretation: Reactive Comments: Dr. Darra Lis reviewed tracing.

## 2022-07-08 ENCOUNTER — Other Ambulatory Visit (HOSPITAL_COMMUNITY)
Admission: RE | Admit: 2022-07-08 | Discharge: 2022-07-08 | Disposition: A | Discharge status: Home / Self Care | Payer: Medicaid Other | Source: Ambulatory Visit | Attending: Obstetrics & Gynecology | Admitting: Obstetrics & Gynecology

## 2022-07-08 ENCOUNTER — Ambulatory Visit (INDEPENDENT_AMBULATORY_CARE_PROVIDER_SITE_OTHER): Payer: Medicaid Other | Admitting: Family Medicine

## 2022-07-08 ENCOUNTER — Other Ambulatory Visit: Payer: Self-pay | Admitting: Advanced Practice Midwife

## 2022-07-08 VITALS — BP 154/94 | HR 106 | Wt 323.0 lb

## 2022-07-08 DIAGNOSIS — O0990 Supervision of high risk pregnancy, unspecified, unspecified trimester: Secondary | ICD-10-CM | POA: Insufficient documentation

## 2022-07-08 DIAGNOSIS — O09523 Supervision of elderly multigravida, third trimester: Secondary | ICD-10-CM

## 2022-07-08 DIAGNOSIS — O132 Gestational [pregnancy-induced] hypertension without significant proteinuria, second trimester: Secondary | ICD-10-CM | POA: Diagnosis not present

## 2022-07-08 NOTE — Progress Notes (Signed)
   PRENATAL VISIT NOTE  Subjective:  Tina Melton is a 37 y.o. [id number] at [redacted]w[redacted]d being seen today for ongoing prenatal care.  She is currently monitored for the following issues for this high-risk pregnancy and has Maternal morbid obesity, antepartum (HCC); BMI 40.0-44.9, adult (HCC); Supervision of high risk pregnancy, antepartum; Pregnancy with history of ectopic pregnancy, antepartum; Advanced maternal age in multigravida; Transient hypertension of pregnancy in second trimester; Subclinical hypothyroidism; and Nutritional counseling on their problem list.  Patient reports no complaints.  Contractions: Irregular. Vag. Bleeding: None.  Movement: Present. Denies leaking of fluid.   The following portions of the patient's history were reviewed and updated as appropriate: allergies, current medications, past family history, past medical history, past social history, past surgical history and problem list.   Objective:   Vitals:   07/08/22 1044 07/08/22 1130  BP: (!) 159/101 (!) 154/94  Pulse: (!) 101 (!) 106  Weight: (!) 323 lb (146.5 kg)     Fetal Status: Fetal Heart Rate (bpm): 150 Fundal Height: 35 cm Movement: Present  Presentation: Vertex  General:  Alert, oriented and cooperative. Patient is in no acute distress.  Skin: Skin is warm and dry. No rash noted.   Cardiovascular: Normal heart rate noted  Respiratory: Normal respiratory effort, no problems with respiration noted  Abdomen: Soft, gravid, appropriate for gestational age.  Pain/Pressure: Present     Pelvic: Cervical exam performed in the presence of a chaperone Dilation: 1 Effacement (%): Thick    Extremities: Normal range of motion.  Edema: None  Mental Status: Normal mood and affect. Normal behavior. Normal judgment and thought content.   Assessment and Plan:  Pregnancy: [id number] at [redacted]w[redacted]d 1. Supervision of high risk pregnancy, antepartum Cultures today - Strep Gp B NAA - Cervicovaginal ancillary only  2. Multigravida  of advanced maternal age in third trimester LR NIPT  3. Transient hypertension of pregnancy in second trimester Labs today--IOL @ 37 weeks Orders placed - CBC - Protein / creatinine ratio, urine - Comprehensive metabolic panel  Preterm labor symptoms and general obstetric precautions including but not limited to vaginal bleeding, contractions, leaking of fluid and fetal movement were reviewed in detail with the patient. Please refer to After Visit Summary for other counseling recommendations.   Return in 1 week (on 07/15/2022).  Future Appointments  Date Time Provider Department Center  07/15/2022 10:55 AM Reva Bores, MD [facility]  07/16/2022 12:00 AM [facility] SCHED ROOM [facility] None  07/20/2022  1:30 PM [facility] NURSE [facility]  07/20/2022  1:45 PM [facility] US4 [facility]  07/27/2022 10:30 AM [facility] NURSE [facility]  07/27/2022 10:45 AM [facility] NST [facility]  07/28/2022  3:15 PM Carolan Shiver, RD [facility]  12/24/2022 10:00 AM Vaillancourt, Lelon Mast, PA-C [facility] None    Reva Bores, MD

## 2022-07-08 NOTE — Progress Notes (Signed)
CC: BP higher  Declines any headache, no Floaters

## 2022-07-09 ENCOUNTER — Encounter (HOSPITAL_COMMUNITY): Payer: Self-pay

## 2022-07-09 ENCOUNTER — Encounter: Payer: Medicaid Other | Admitting: Obstetrics & Gynecology

## 2022-07-09 ENCOUNTER — Telehealth (HOSPITAL_COMMUNITY): Payer: Self-pay | Admitting: *Deleted

## 2022-07-09 LAB — CBC
Hematocrit: 33.8 % — ABNORMAL LOW (ref 34.0–46.6)
Hemoglobin: 11.4 g/dL (ref 11.1–15.9)
MCH: 26.3 pg — ABNORMAL LOW (ref 26.6–33.0)
MCHC: 33.7 g/dL (ref 31.5–35.7)
MCV: 78 fL — ABNORMAL LOW (ref 79–97)
Platelets: 317 10*3/uL (ref 150–450)
RBC: 4.33 x10E6/uL (ref 3.77–5.28)
RDW: 13 % (ref 11.7–15.4)
WBC: 9 10*3/uL (ref 3.4–10.8)

## 2022-07-09 LAB — COMPREHENSIVE METABOLIC PANEL
ALT: 15 IU/L (ref 0–32)
AST: 13 IU/L (ref 0–40)
Albumin/Globulin Ratio: 1.1 — ABNORMAL LOW (ref 1.2–2.2)
Albumin: 3.2 g/dL — ABNORMAL LOW (ref 3.9–4.9)
Alkaline Phosphatase: 149 IU/L — ABNORMAL HIGH (ref 44–121)
BUN/Creatinine Ratio: 18 (ref 9–23)
BUN: 10 mg/dL (ref 6–20)
Bilirubin Total: 0.3 mg/dL (ref 0.0–1.2)
CO2: 20 mmol/L (ref 20–29)
Calcium: 8.8 mg/dL (ref 8.7–10.2)
Chloride: 105 mmol/L (ref 96–106)
Creatinine, Ser: 0.57 mg/dL (ref 0.57–1.00)
Globulin, Total: 2.8 g/dL (ref 1.5–4.5)
Glucose: 82 mg/dL (ref 70–99)
Potassium: 4.1 mmol/L (ref 3.5–5.2)
Sodium: 136 mmol/L (ref 134–144)
Total Protein: 6 g/dL (ref 6.0–8.5)
eGFR: 119 mL/min/{1.73_m2} (ref >59)

## 2022-07-09 LAB — PROTEIN / CREATININE RATIO, URINE
Creatinine, Urine: 258.2 mg/dL
Protein, Ur: 37.7 mg/dL
Protein/Creat Ratio: 146 mg/g creat (ref 0–200)

## 2022-07-09 LAB — CERVICOVAGINAL ANCILLARY ONLY
Chlamydia: NEGATIVE
Comment: NEGATIVE
Comment: NORMAL
Neisseria Gonorrhea: NEGATIVE

## 2022-07-09 NOTE — Telephone Encounter (Signed)
Preadmission screen  

## 2022-07-10 ENCOUNTER — Encounter: Payer: Self-pay | Admitting: Family Medicine

## 2022-07-10 DIAGNOSIS — O99820 Streptococcus B carrier state complicating pregnancy: Secondary | ICD-10-CM | POA: Insufficient documentation

## 2022-07-10 LAB — STREP GP B NAA: Strep Gp B NAA: POSITIVE — AB

## 2022-07-14 ENCOUNTER — Other Ambulatory Visit: Payer: Self-pay | Admitting: Advanced Practice Midwife

## 2022-07-15 ENCOUNTER — Telehealth (HOSPITAL_COMMUNITY): Payer: Self-pay | Admitting: *Deleted

## 2022-07-15 ENCOUNTER — Ambulatory Visit (INDEPENDENT_AMBULATORY_CARE_PROVIDER_SITE_OTHER): Payer: Medicaid Other | Admitting: Family Medicine

## 2022-07-15 ENCOUNTER — Inpatient Hospital Stay (HOSPITAL_COMMUNITY)
Admission: RE | Admit: 2022-07-15 | Discharge: 2022-07-18 | DRG: 807 | Disposition: A | Discharge status: Home / Self Care | Payer: Medicaid Other | Attending: Obstetrics and Gynecology | Admitting: Obstetrics and Gynecology

## 2022-07-15 ENCOUNTER — Encounter (HOSPITAL_COMMUNITY): Payer: Self-pay | Admitting: *Deleted

## 2022-07-15 ENCOUNTER — Encounter (HOSPITAL_COMMUNITY): Payer: Self-pay | Admitting: Family Medicine

## 2022-07-15 VITALS — BP 126/77 | HR 79 | Wt 327.0 lb

## 2022-07-15 DIAGNOSIS — O139 Gestational [pregnancy-induced] hypertension without significant proteinuria, unspecified trimester: Secondary | ICD-10-CM | POA: Diagnosis present

## 2022-07-15 DIAGNOSIS — O99214 Obesity complicating childbirth: Secondary | ICD-10-CM | POA: Diagnosis not present

## 2022-07-15 DIAGNOSIS — O0990 Supervision of high risk pregnancy, unspecified, unspecified trimester: Secondary | ICD-10-CM | POA: Diagnosis not present

## 2022-07-15 DIAGNOSIS — O133 Gestational [pregnancy-induced] hypertension without significant proteinuria, third trimester: Secondary | ICD-10-CM

## 2022-07-15 DIAGNOSIS — O1490 Unspecified pre-eclampsia, unspecified trimester: Secondary | ICD-10-CM | POA: Diagnosis not present

## 2022-07-15 DIAGNOSIS — O134 Gestational [pregnancy-induced] hypertension without significant proteinuria, complicating childbirth: Secondary | ICD-10-CM | POA: Diagnosis not present

## 2022-07-15 DIAGNOSIS — Z3A37 37 weeks gestation of pregnancy: Secondary | ICD-10-CM | POA: Diagnosis not present

## 2022-07-15 DIAGNOSIS — E039 Hypothyroidism, unspecified: Secondary | ICD-10-CM | POA: Diagnosis not present

## 2022-07-15 DIAGNOSIS — O99824 Streptococcus B carrier state complicating childbirth: Secondary | ICD-10-CM | POA: Diagnosis not present

## 2022-07-15 DIAGNOSIS — O1414 Severe pre-eclampsia complicating childbirth: Principal | ICD-10-CM | POA: Diagnosis present

## 2022-07-15 DIAGNOSIS — O99284 Endocrine, nutritional and metabolic diseases complicating childbirth: Secondary | ICD-10-CM | POA: Diagnosis present

## 2022-07-15 DIAGNOSIS — O09523 Supervision of elderly multigravida, third trimester: Secondary | ICD-10-CM

## 2022-07-15 DIAGNOSIS — Z8759 Personal history of other complications of pregnancy, childbirth and the puerperium: Secondary | ICD-10-CM | POA: Diagnosis present

## 2022-07-15 DIAGNOSIS — E038 Other specified hypothyroidism: Secondary | ICD-10-CM | POA: Diagnosis present

## 2022-07-15 DIAGNOSIS — O99820 Streptococcus B carrier state complicating pregnancy: Secondary | ICD-10-CM

## 2022-07-15 DIAGNOSIS — O132 Gestational [pregnancy-induced] hypertension without significant proteinuria, second trimester: Secondary | ICD-10-CM

## 2022-07-15 DIAGNOSIS — Z6841 Body Mass Index (BMI) 40.0 and over, adult: Secondary | ICD-10-CM

## 2022-07-15 DIAGNOSIS — O09529 Supervision of elderly multigravida, unspecified trimester: Secondary | ICD-10-CM

## 2022-07-15 LAB — COMPREHENSIVE METABOLIC PANEL
ALT: 22 U/L (ref 0–44)
AST: 25 U/L (ref 15–41)
Albumin: 2.4 g/dL — ABNORMAL LOW (ref 3.5–5.0)
Alkaline Phosphatase: 143 U/L — ABNORMAL HIGH (ref 38–126)
Anion gap: 11 (ref 5–15)
BUN: 11 mg/dL (ref 6–20)
CO2: 19 mmol/L — ABNORMAL LOW (ref 22–32)
Calcium: 8.4 mg/dL — ABNORMAL LOW (ref 8.9–10.3)
Chloride: 103 mmol/L (ref 98–111)
Creatinine, Ser: 0.63 mg/dL (ref 0.44–1.00)
GFR, Estimated: >60 mL/min (ref >60)
Glucose, Bld: 87 mg/dL (ref 70–99)
Potassium: 3.8 mmol/L (ref 3.5–5.1)
Sodium: 133 mmol/L — ABNORMAL LOW (ref 135–145)
Total Bilirubin: 0.9 mg/dL (ref 0.3–1.2)
Total Protein: 6.4 g/dL — ABNORMAL LOW (ref 6.5–8.1)

## 2022-07-15 LAB — PROTEIN / CREATININE RATIO, URINE
Creatinine, Urine: 146 mg/dL
Protein Creatinine Ratio: 0.12 mg/mg{Cre} (ref 0.00–0.15)
Total Protein, Urine: 18 mg/dL

## 2022-07-15 LAB — CBC
HCT: 36.1 % (ref 36.0–46.0)
Hemoglobin: 11.6 g/dL — ABNORMAL LOW (ref 12.0–15.0)
MCH: 25.5 pg — ABNORMAL LOW (ref 26.0–34.0)
MCHC: 32.1 g/dL (ref 30.0–36.0)
MCV: 79.3 fL — ABNORMAL LOW (ref 80.0–100.0)
Platelets: 299 10*3/uL (ref 150–400)
RBC: 4.55 MIL/uL (ref 3.87–5.11)
RDW: 13.5 % (ref 11.5–15.5)
WBC: 8.8 10*3/uL (ref 4.0–10.5)
nRBC: 0 % (ref 0.0–0.2)

## 2022-07-15 LAB — TYPE AND SCREEN
ABO/RH(D): B POS
Antibody Screen: NEGATIVE

## 2022-07-15 MED ORDER — FENTANYL CITRATE (PF) 100 MCG/2ML IJ SOLN
50.0000 ug | INTRAMUSCULAR | Status: DC | PRN
  Administered 2022-07-15 – 2022-07-16 (×3): 100 ug via INTRAVENOUS
  Filled 2022-07-15 (×3): qty 2

## 2022-07-15 MED ORDER — MISOPROSTOL 50MCG HALF TABLET: 50.0000 ug | ORAL_TABLET | Freq: Once | ORAL | Status: DC

## 2022-07-15 MED ORDER — MAGNESIUM SULFATE BOLUS VIA INFUSION
4.0000 g | Freq: Once | INTRAVENOUS | Status: AC
  Administered 2022-07-15: 4 g via INTRAVENOUS
  Filled 2022-07-15: qty 1000

## 2022-07-15 MED ORDER — SODIUM CHLORIDE 0.9 % IV SOLN
5.0000 10*6.[IU] | Freq: Once | INTRAVENOUS | Status: AC
  Administered 2022-07-15: 5 10*6.[IU] via INTRAVENOUS
  Filled 2022-07-15: qty 5

## 2022-07-15 MED ORDER — NIFEDIPINE ER OSMOTIC RELEASE 30 MG PO TB24
30.0000 mg | ORAL_TABLET | Freq: Every day | ORAL | Status: DC
  Administered 2022-07-15 – 2022-07-18 (×4): 30 mg via ORAL
  Filled 2022-07-15 (×4): qty 1

## 2022-07-15 MED ORDER — PHENYLEPHRINE 80 MCG/ML (10ML) SYRINGE FOR IV PUSH (FOR BLOOD PRESSURE SUPPORT)
80.0000 ug | PREFILLED_SYRINGE | INTRAVENOUS | Status: DC | PRN
  Filled 2022-07-15: qty 10

## 2022-07-15 MED ORDER — OXYCODONE-ACETAMINOPHEN 5-325 MG PO TABS: 1.0000 | ORAL_TABLET | ORAL | Status: DC | PRN

## 2022-07-15 MED ORDER — MAGNESIUM SULFATE 40 GM/1000ML IV SOLN
2.0000 g/h | INTRAVENOUS | Status: DC
  Administered 2022-07-16: 2 g/h via INTRAVENOUS
  Filled 2022-07-15 (×2): qty 1000

## 2022-07-15 MED ORDER — EPHEDRINE 5 MG/ML INJ
10.0000 mg | INTRAVENOUS | Status: DC | PRN
  Administered 2022-07-16: 10 mg via INTRAVENOUS

## 2022-07-15 MED ORDER — FENTANYL-BUPIVACAINE-NACL 0.5-0.125-0.9 MG/250ML-% EP SOLN
12.0000 mL/h | EPIDURAL | Status: DC | PRN
  Administered 2022-07-16: 12 mL/h via EPIDURAL
  Filled 2022-07-15: qty 250

## 2022-07-15 MED ORDER — PENICILLIN G POT IN DEXTROSE 60000 UNIT/ML IV SOLN
3.0000 10*6.[IU] | INTRAVENOUS | Status: DC
  Administered 2022-07-15 – 2022-07-16 (×4): 3 10*6.[IU] via INTRAVENOUS
  Filled 2022-07-15 (×6): qty 50

## 2022-07-15 MED ORDER — EPHEDRINE 5 MG/ML INJ
10.0000 mg | INTRAVENOUS | Status: DC | PRN
  Filled 2022-07-15: qty 5

## 2022-07-15 MED ORDER — LABETALOL HCL 5 MG/ML IV SOLN
40.0000 mg | INTRAVENOUS | Status: DC | PRN
  Administered 2022-07-16: 40 mg via INTRAVENOUS
  Filled 2022-07-15 (×2): qty 8

## 2022-07-15 MED ORDER — FLEET ENEMA 7-19 GM/118ML RE ENEM: 1.0000 | ENEMA | RECTAL | Status: DC | PRN

## 2022-07-15 MED ORDER — LACTATED RINGERS IV SOLN: 500.0000 mL | Freq: Once | INTRAVENOUS | Status: DC

## 2022-07-15 MED ORDER — MISOPROSTOL 25 MCG QUARTER TABLET
25.0000 ug | ORAL_TABLET | Freq: Once | ORAL | Status: AC
  Administered 2022-07-15: 25 ug via VAGINAL
  Filled 2022-07-15: qty 1

## 2022-07-15 MED ORDER — LACTATED RINGERS IV SOLN: INTRAVENOUS | Status: DC

## 2022-07-15 MED ORDER — LABETALOL HCL 5 MG/ML IV SOLN
80.0000 mg | INTRAVENOUS | Status: DC | PRN
  Filled 2022-07-15: qty 16

## 2022-07-15 MED ORDER — SOD CITRATE-CITRIC ACID 500-334 MG/5ML PO SOLN: 30.0000 mL | ORAL | Status: DC | PRN

## 2022-07-15 MED ORDER — LABETALOL HCL 5 MG/ML IV SOLN
20.0000 mg | INTRAVENOUS | Status: DC | PRN
  Administered 2022-07-15: 20 mg via INTRAVENOUS
  Filled 2022-07-15: qty 4

## 2022-07-15 MED ORDER — OXYTOCIN BOLUS FROM INFUSION
333.0000 mL | Freq: Once | INTRAVENOUS | Status: AC
  Administered 2022-07-16: 333 mL via INTRAVENOUS

## 2022-07-15 MED ORDER — LIDOCAINE HCL (PF) 1 % IJ SOLN: 30.0000 mL | INTRAMUSCULAR | Status: DC | PRN

## 2022-07-15 MED ORDER — OXYTOCIN-SODIUM CHLORIDE 30-0.9 UT/500ML-% IV SOLN
2.5000 [IU]/h | INTRAVENOUS | Status: DC
  Filled 2022-07-15: qty 500

## 2022-07-15 MED ORDER — HYDRALAZINE HCL 20 MG/ML IJ SOLN: 10.0000 mg | INTRAMUSCULAR | Status: DC | PRN

## 2022-07-15 MED ORDER — MISOPROSTOL 50MCG HALF TABLET
50.0000 ug | ORAL_TABLET | Freq: Once | ORAL | Status: AC
  Administered 2022-07-15: 50 ug via ORAL
  Filled 2022-07-15: qty 1

## 2022-07-15 MED ORDER — DIPHENHYDRAMINE HCL 50 MG/ML IJ SOLN: 12.5000 mg | INTRAMUSCULAR | Status: DC | PRN

## 2022-07-15 MED ORDER — PHENYLEPHRINE 80 MCG/ML (10ML) SYRINGE FOR IV PUSH (FOR BLOOD PRESSURE SUPPORT)
80.0000 ug | PREFILLED_SYRINGE | INTRAVENOUS | Status: AC | PRN
  Administered 2022-07-16 (×3): 80 ug via INTRAVENOUS

## 2022-07-15 MED ORDER — LABETALOL HCL 5 MG/ML IV SOLN
20.0000 mg | INTRAVENOUS | Status: DC | PRN
  Administered 2022-07-16: 20 mg via INTRAVENOUS
  Filled 2022-07-15: qty 4

## 2022-07-15 MED ORDER — LABETALOL HCL 5 MG/ML IV SOLN
80.0000 mg | INTRAVENOUS | Status: DC | PRN
  Administered 2022-07-15: 80 mg via INTRAVENOUS

## 2022-07-15 MED ORDER — ONDANSETRON HCL 4 MG/2ML IJ SOLN
4.0000 mg | Freq: Four times a day (QID) | INTRAMUSCULAR | Status: DC | PRN
  Administered 2022-07-15 – 2022-07-16 (×2): 4 mg via INTRAVENOUS
  Filled 2022-07-15 (×2): qty 2

## 2022-07-15 MED ORDER — MISOPROSTOL 25 MCG QUARTER TABLET: 25.0000 ug | ORAL_TABLET | Freq: Once | ORAL | Status: DC

## 2022-07-15 MED ORDER — LABETALOL HCL 5 MG/ML IV SOLN
40.0000 mg | INTRAVENOUS | Status: DC | PRN
  Administered 2022-07-15: 40 mg via INTRAVENOUS
  Filled 2022-07-15: qty 8

## 2022-07-15 MED ORDER — LACTATED RINGERS IV SOLN
500.0000 mL | INTRAVENOUS | Status: DC | PRN
  Administered 2022-07-16: 500 mL via INTRAVENOUS

## 2022-07-15 MED ORDER — TERBUTALINE SULFATE 1 MG/ML IJ SOLN: 0.2500 mg | Freq: Once | INTRAMUSCULAR | Status: DC | PRN

## 2022-07-15 MED ORDER — ACETAMINOPHEN 325 MG PO TABS
650.0000 mg | ORAL_TABLET | ORAL | Status: DC | PRN
  Administered 2022-07-16: 650 mg via ORAL
  Filled 2022-07-15: qty 2

## 2022-07-15 MED ORDER — OXYCODONE-ACETAMINOPHEN 5-325 MG PO TABS: 2.0000 | ORAL_TABLET | ORAL | Status: DC | PRN

## 2022-07-15 NOTE — H&P (Cosign Needed Addendum)
OBSTETRIC ADMISSION HISTORY AND PHYSICAL  Tina Melton is a 37 y.o. female [phone] with IUP at [redacted]w[redacted]d by LMP presenting for IOL for gHTN. She reports +FMs, No LOF, no VB, no blurry vision, headaches or peripheral edema, and RUQ pain.  She plans on breast and bottle feeding. She request nothing for birth control. She [facility] at  [facility]    Dating: By LMP --->  Estimated Date of Delivery: 08/05/22  Sono:    @[redacted]w[redacted]d , CWD, normal anatomy, breech presentation, anterior fundal placenta, 294g, 81% EFW @[redacted]w[redacted]d , CWD, normal anatomy, cephalic presentation, Anterior fundal placenta, 2181g, 32% EFW  Prenatal History/Complications:  -gHTN -obesity -AMA -hypothyroidism -GBS pos        Nursing Staff Provider  Office Location [place] Dating  08/05/2022, by Last Menstrual Period  [facility] Model [x ] Traditional [ ]  Centering [ ]  Mom-Baby Dyad Anatomy US  WNL, but limited, f/u in 4 weeks  Language  English      Flu Vaccine  Declined  Genetic/Carrier Screen  NIPS: low risk female   AFP:   wnl Horizon: Neg x 4  TDaP Vaccine   Declined 05/13/22 Hgb A1C or  GTT Early 5.1 Third trimester 74/109/84  COVID Vaccine     LAB RESULTS   Rhogam  B/Positive/-- (11/29 1621)  Blood Type B/Positive/-- (11/29 1621)   Baby Feeding Plan  Both Antibody Negative (11/29 1621)  Contraception None Rubella 7.51 (11/29 1621)  Circumcision Yes if Boy RPR Non Reactive (03/27 0938)   Pediatrician  Undecided HBsAg Negative (11/29 1621)   Support Person FOB HCVAb Non Reactive (11/29 1621)   Prenatal Classes No HIV Non Reactive (03/27 4540)     BTL Consent   GBS  Positive  VBAC Consent   Pap       Diagnosis  Date Value Ref Range Status  01/14/2022 - Benign reactive/reparative changes   Final  01/14/2022     Final    - Negative for Intraepithelial Lesions or Malignancy (NILM)             DME Rx [ ]  BP cuff [ ]  Weight Scale Waterbirth  [ ]  Class [ ]  Consent [ ]  CNM visit  PHQ9 & GAD7 [ x ] new OB [   ] 28 weeks  [  ] 36 weeks Induction  [ ]  Orders Entered [ ] Foley Y/N    Past Medical History: 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   History of chicken pox    History of chlamydia    History of hematuria 09/18/2021   History of hemorrhage specific to perinatal period    6-7 mos bleeding x2, heavy bldg with 2nd preg was told baby nicked 2 blood vessels   History of pre-eclampsia in prior pregnancy, currently pregnant 01/14/2022   Baseline labs  ASA   Obesity    Panic attacks    Pregnancy induced hypertension    Proteinuria 04/17/2011   Yeast infection     Past Surgical History: Past Surgical History:  Procedure Laterality Date   INDUCED ABORTION     LAPAROSCOPIC UNILATERAL SALPINGECTOMY Left 08/11/2021   Procedure: LAPAROSCOPIC LEFT SALPINGECTOMY WITH REMOVAL OF ECTOPIC PREGNANCY;  Surgeon: [place] Bing, MD;  Location: [facility];  Service: Gynecology;  Laterality: Left;   LIPOMA EXCISION Right 10/21/2021   Procedure:  EXCISION LIPOMA;  Surgeon: Henrene Dodge, MD;  Location: [facility] ORS;  Service: General;  Laterality: Right;    Obstetrical History: OB History     Gravida  9   Para  2   Term  2   Preterm  0   AB  6   Living  2      SAB  1   IAB  4   Ectopic  1   Multiple  0   Live Births  2           Social History Social History   Socioeconomic History   Marital status: Single    Spouse name: Not on file   Number of children: 2   Years of education: Not on file   Highest education level: Not on file  Occupational History   Occupation: mental health therapist  Tobacco Use   Smoking status: Never   Smokeless tobacco: Never  Vaping Use   Vaping Use: Never used  Substance and Sexual Activity   Alcohol use: No   Drug use: Not Currently    Comment: marijuana years  ago   Sexual activity: Yes    Birth control/protection: None  Other Topics Concern   Not on file  Social History Narrative   Not on file   Social Determinants of Health   Financial Resource Strain: Not on file  Food Insecurity: No Food Insecurity (07/15/2022)   Hunger Vital Sign    Worried About Running Out of Food in the Last Year: Never true    Ran Out of Food in the Last Year: Never true  Transportation Needs: No Transportation Needs (07/15/2022)   PRAPARE - Administrator, Civil Service (Medical): No    Lack of Transportation (Non-Medical): No  Physical Activity: Not on file  Stress: Not on file  Social Connections: Not on file    Family History: Family History  Problem Relation Age of Onset   Mental illness Mother        schizophrenia and panic attacks   Rheum arthritis Father    Arthritis Father    Birth defects Son        extra digit   Hypertension Maternal Grandmother    Stroke Maternal Grandmother    Dementia Maternal Grandmother    Diabetes Maternal Grandfather    Rheum arthritis Paternal Grandmother    Arthritis Paternal Grandmother    Diabetes Paternal Grandmother    Prostate cancer Neg Hx    Bladder Cancer Neg Hx    Kidney cancer Neg Hx    Asthma Neg Hx    Heart disease Neg Hx     Allergies: Allergies  Allergen Reactions   Lemon Juice Anaphylaxis   Lemon Oil Anaphylaxis   Gemtesa [Vibegron] Rash    Medications Prior to Admission  Medication Sig Dispense Refill Last Dose   acetaminophen (TYLENOL) 325 MG tablet Take 650 mg by mouth every 6 (six) hours as needed.   07/15/2022   aspirin EC 81 MG tablet Take 1 tablet (81 mg total) by mouth daily. Take after 12 weeks for prevention of preeclampsia later in pregnancy 300 tablet 2 07/15/2022   levothyroxine (SYNTHROID) 25 MCG tablet Take 1 tablet (25 mcg total) by mouth daily before breakfast. 80 tablet 0 Past Month   Prenatal Vit-Fe Fumarate-FA (PRENATAL MULTIVITAMIN) TABS tablet Take 1 tablet  by mouth daily at 12 noon.   07/15/2022    Review of Systems   All systems reviewed and negative except  as stated in HPI  Blood pressure (!) 156/94, pulse (!) 107, temperature 98.8 F (37.1 C), temperature source Oral, resp. rate 16, height 5\' 11"  (1.803 m), weight (!) 149 kg, last menstrual period 10/29/2021, unknown if currently breastfeeding. General appearance: alert, cooperative, and appears stated age Lungs: clear to auscultation bilaterally Heart: regular rate and rhythm Abdomen: soft, non-tender; gravid Pelvic: deferred Extremities: no sign of DVT, no edema DTR's normal, +2 Presentation: cephalic, per RN exam Fetal monitoringBaseline: 150 bpm, Variability: Good {> 6 bpm), Accelerations: Reactive, and Decelerations: Absent Uterine activity None   Prenatal labs: ABO, Rh: --/--/PENDING (05/29 1418) Antibody: PENDING (05/29 1418) Rubella: 7.51 (11/29 1621) RPR: Non Reactive (03/27 0938)  HBsAg: Negative (11/29 1621)  HIV: Non Reactive (03/27 1478)  GBS: Positive/-- (05/22 1100)  1 hr Glucola 74/109/84 Genetic screening  wnl, low risk Anatomy US wnl  Prenatal Transfer Tool  Maternal Diabetes: No Genetic Screening: Normal Maternal Ultrasounds/Referrals: Normal Fetal Ultrasounds or other Referrals:  None Maternal Substance Abuse:  No Significant Maternal Medications:  Meds include: Syntroid Significant Maternal Lab Results: Group B Strep positive  Results for orders placed or performed during the hospital encounter of 07/15/22 (from the past 24 hour(s))  CBC   Collection Time: 07/15/22  2:18 PM  Result Value Ref Range   WBC 8.8 4.0 - 10.5 K/uL   RBC 4.55 3.87 - 5.11 MIL/uL   Hemoglobin 11.6 (L) 12.0 - 15.0 g/dL   HCT 29.5 62.1 - 30.8 %   MCV 79.3 (L) 80.0 - 100.0 fL   MCH 25.5 (L) 26.0 - 34.0 pg   MCHC 32.1 30.0 - 36.0 g/dL   RDW 65.7 84.6 - 96.2 %   Platelets 299 150 - 400 K/uL   nRBC 0.0 0.0 - 0.2 %  Type and screen   Collection Time: 07/15/22  2:18 PM   Result Value Ref Range   ABO/RH(D) PENDING    Antibody Screen PENDING    Sample Expiration      07/18/2022,2359 Performed at [facility], [address] [place], [place] [address]     Patient Active Problem List   Diagnosis Date Noted   Group B Streptococcus carrier, +RV culture, currently pregnant 07/10/2022   Nutritional counseling 04/23/2022   Subclinical hypothyroidism 02/24/2022   Gestational hypertension 02/19/2022   Supervision of high risk pregnancy, antepartum 12/11/2021   Pregnancy with history of ectopic pregnancy, antepartum 12/11/2021   Advanced maternal age in multigravida 12/11/2021   BMI 40.0-44.9, adult (HCC) 05/29/2011   Maternal morbid obesity, antepartum (HCC) 04/17/2011    Assessment/Plan:  Tina Melton is a 37 y.o. [id number] at [redacted]w[redacted]d here for IOL for gHTN.  #Labor:cervical ripening followed by foley balloon, pitocin, AROM when appropriate. #gHTN: BPs mild range. Labs pending. Patient asymptomatic #Pain: Prn per patient request #FWB: Cat 1  #ID:  GBS pos, prophylactic antibiotics q 4 until delivery #MOF: breast and bottle feeding  #MOC:nothing #Circ:  N/A  Karis Juba, Student-MidWife  07/15/2022, 2:55 PM   Attestation of CNM Supervision of Midwife Student: Evaluation and management procedures were performed by the midwife student under my supervision. I was immediately available for direct supervision, assistance and direction throughout this encounter.  I also confirm that I have verified the information documented in the resident's note, and that I have also personally reperformed the pertinent components of the physical exam and all of the medical decision making activities.  I have also made any necessary editorial changes.  Dual cytotec placed.  Plan for foley balloon placement when appropriate.   Brand Males, CNM 07/15/2022 3:50 PM

## 2022-07-15 NOTE — Progress Notes (Signed)
   PRENATAL VISIT NOTE  Subjective:  Tina Melton is a 37 y.o. [id number] at [redacted]w[redacted]d being seen today for ongoing prenatal care.  She is currently monitored for the following issues for this high-risk pregnancy and has Maternal morbid obesity, antepartum (HCC); BMI 40.0-44.9, adult (HCC); Supervision of high risk pregnancy, antepartum; Pregnancy with history of ectopic pregnancy, antepartum; Advanced maternal age in multigravida; Transient hypertension of pregnancy in second trimester; Subclinical hypothyroidism; Nutritional counseling; and Group B Streptococcus carrier, +RV culture, currently pregnant on their problem list.  Patient reports  LE edema .  Contractions: Irregular. Vag. Bleeding: None.  Movement: Present. Denies leaking of fluid.   The following portions of the patient's history were reviewed and updated as appropriate: allergies, current medications, past family history, past medical history, past social history, past surgical history and problem list.   Objective:   Vitals:   07/15/22 1112 07/15/22 1126  BP: (!) 171/98 126/77  Pulse: 88 79  Weight: (!) 327 lb (148.3 kg)     Fetal Status: Fetal Heart Rate (bpm): NST   Movement: Present     General:  Alert, oriented and cooperative. Patient is in no acute distress.  Skin: Skin is warm and dry. No rash noted.   Cardiovascular: Normal heart rate noted  Respiratory: Normal respiratory effort, no problems with respiration noted  Abdomen: Soft, gravid, appropriate for gestational age.  Pain/Pressure: Present     Pelvic: Cervical exam deferred        Extremities: Normal range of motion.  Edema: Moderate pitting, indentation subsides rapidly  Mental Status: Normal mood and affect. Normal behavior. Normal judgment and thought content.   Assessment and Plan:  Pregnancy: [id number] at [redacted]w[redacted]d 1. Supervision of high risk pregnancy, antepartum  2. Multigravida of advanced maternal age in third trimester LR NIPT  3. Group B  Streptococcus carrier, +RV culture, currently pregnant Will need treatment in labor  4. Gestational hypertension, third trimester Initial BP quite high, last few have been high, normal labs Now > 37 weeks, will move to IOL today, was scheduled for 1200am tonight, but will send  now due to BP and tracing. - Fetal nonstress test NST:  Baseline: 140 bpm, Variability: Good {> 6 bpm), Accelerations: Reactive, and Decelerations: Absent Periods of less variability   Term labor symptoms and general obstetric precautions including but not limited to vaginal bleeding, contractions, leaking of fluid and fetal movement were reviewed in detail with the patient. Please refer to After Visit Summary for other counseling recommendations.   Return in 4 weeks (on 08/12/2022).  Future Appointments  Date Time Provider Department Center  07/16/2022 12:00 AM [facility] SCHED ROOM [facility] None  07/20/2022  1:30 PM [facility] NURSE [facility]  07/20/2022  1:45 PM [facility] US4 [facility]  07/27/2022 10:30 AM [facility] NURSE [facility]  07/27/2022 10:45 AM [facility] NST [facility]  07/28/2022  3:15 PM Carolan Shiver, RD [facility]  12/24/2022 10:00 AM Vaillancourt, Lelon Mast, PA-C [facility] None    Reva Bores, MD

## 2022-07-15 NOTE — Progress Notes (Signed)
Labor Progress Note Tina Melton is a 37 y.o. [id number] at [redacted]w[redacted]d presented for IOL due to gHTN S: evaluated patient at bedside, RN informed me about severe range blood pressures noted this evening. Repeat blood pressure after about 40 mins still in severe range. Patient having some painful cramping. IV labetalol protocol started.   O:  BP 125/85   Pulse 87   Temp 98.9 F (37.2 C) (Oral)   Resp 16   Ht 5\' 11"  (1.803 m)   Wt (!) 149 kg   LMP 10/29/2021   BMI 45.80 kg/m   EFM: 145bpm/moderate variability/+accels, no decels.  CVE: Dilation: 1 Effacement (%): 50 Station: -3 Presentation: Vertex Exam by:: Dr. Ladon Applebaum   A&P: 37 y.o. [id number] [redacted]w[redacted]d IOL for gHTN. Blood pressures are concerning for pre-eclampsia with severe features at this time. #Labor: IOL continues. S/p cytotec X 1 round. Still contracting every 1-3 mins. Too often to repeat cytotec. FB inserted with 60cc of fluid. - will plan to recheck after FB out. Can consider repeating cytotec if contractions wane. #Pain: family support, IV fentanyl, epidural when ready  #FWB: cat 1. #GBS positive - PCN  Pre-eclampsia with severe features: new onset severe range blood pressures noted. Discussed risk of seizures with patient. IV labetalol protocol given, with improvement in blood pressure numbers after the 3rd dose.  - start IV magnesium for seizure ppx - start PO procardia XL 30mg  daily - check labs: CBC, CMP, Mg about 5am tomorrow.   Sheppard Evens MD MPH OB Fellow, Faculty Practice [facility], Center for [facility] Healthcare 07/15/2022

## 2022-07-15 NOTE — Telephone Encounter (Signed)
Preadmission screen  

## 2022-07-16 ENCOUNTER — Encounter (HOSPITAL_COMMUNITY): Payer: Self-pay | Admitting: Family Medicine

## 2022-07-16 ENCOUNTER — Inpatient Hospital Stay (HOSPITAL_COMMUNITY): Payer: Medicaid Other | Admitting: Anesthesiology

## 2022-07-16 ENCOUNTER — Encounter: Payer: Medicaid Other | Admitting: Advanced Practice Midwife

## 2022-07-16 ENCOUNTER — Inpatient Hospital Stay (HOSPITAL_COMMUNITY): Payer: Medicaid Other

## 2022-07-16 DIAGNOSIS — O1490 Unspecified pre-eclampsia, unspecified trimester: Secondary | ICD-10-CM | POA: Diagnosis not present

## 2022-07-16 DIAGNOSIS — O99820 Streptococcus B carrier state complicating pregnancy: Secondary | ICD-10-CM

## 2022-07-16 DIAGNOSIS — O1414 Severe pre-eclampsia complicating childbirth: Secondary | ICD-10-CM

## 2022-07-16 DIAGNOSIS — O09523 Supervision of elderly multigravida, third trimester: Secondary | ICD-10-CM

## 2022-07-16 DIAGNOSIS — O99284 Endocrine, nutritional and metabolic diseases complicating childbirth: Secondary | ICD-10-CM

## 2022-07-16 DIAGNOSIS — Z3A37 37 weeks gestation of pregnancy: Secondary | ICD-10-CM

## 2022-07-16 DIAGNOSIS — O99214 Obesity complicating childbirth: Secondary | ICD-10-CM

## 2022-07-16 LAB — CBC
HCT: 34.2 % — ABNORMAL LOW (ref 36.0–46.0)
HCT: 35.4 % — ABNORMAL LOW (ref 36.0–46.0)
Hemoglobin: 11 g/dL — ABNORMAL LOW (ref 12.0–15.0)
Hemoglobin: 11.3 g/dL — ABNORMAL LOW (ref 12.0–15.0)
MCH: 25.3 pg — ABNORMAL LOW (ref 26.0–34.0)
MCH: 25.4 pg — ABNORMAL LOW (ref 26.0–34.0)
MCHC: 32.2 g/dL (ref 30.0–36.0)
MCHC: 31.9 g/dL (ref 30.0–36.0)
MCV: 78.8 fL — ABNORMAL LOW (ref 80.0–100.0)
MCV: 79.6 fL — ABNORMAL LOW (ref 80.0–100.0)
Platelets: 309 10*3/uL (ref 150–400)
Platelets: 262 10*3/uL (ref 150–400)
RBC: 4.34 MIL/uL (ref 3.87–5.11)
RBC: 4.45 MIL/uL (ref 3.87–5.11)
RDW: 13.6 % (ref 11.5–15.5)
RDW: 13.7 % (ref 11.5–15.5)
WBC: 11.6 10*3/uL — ABNORMAL HIGH (ref 4.0–10.5)
WBC: 14.1 10*3/uL — ABNORMAL HIGH (ref 4.0–10.5)
nRBC: 0 % (ref 0.0–0.2)
nRBC: 0 % (ref 0.0–0.2)

## 2022-07-16 LAB — COMPREHENSIVE METABOLIC PANEL
ALT: 21 U/L (ref 0–44)
AST: 20 U/L (ref 15–41)
Albumin: 2.4 g/dL — ABNORMAL LOW (ref 3.5–5.0)
Alkaline Phosphatase: 141 U/L — ABNORMAL HIGH (ref 38–126)
Anion gap: 9 (ref 5–15)
BUN: 9 mg/dL (ref 6–20)
CO2: 18 mmol/L — ABNORMAL LOW (ref 22–32)
Calcium: 7.9 mg/dL — ABNORMAL LOW (ref 8.9–10.3)
Chloride: 103 mmol/L (ref 98–111)
Creatinine, Ser: 0.64 mg/dL (ref 0.44–1.00)
GFR, Estimated: >60 mL/min (ref >60)
Glucose, Bld: 103 mg/dL — ABNORMAL HIGH (ref 70–99)
Potassium: 4.1 mmol/L (ref 3.5–5.1)
Sodium: 130 mmol/L — ABNORMAL LOW (ref 135–145)
Total Bilirubin: 0.5 mg/dL (ref 0.3–1.2)
Total Protein: 6.3 g/dL — ABNORMAL LOW (ref 6.5–8.1)

## 2022-07-16 LAB — RPR: RPR Ser Ql: NONREACTIVE

## 2022-07-16 LAB — MAGNESIUM: Magnesium: 3.9 mg/dL — ABNORMAL HIGH (ref 1.7–2.4)

## 2022-07-16 MED ORDER — LEVOTHYROXINE SODIUM 25 MCG PO TABS: 25.0000 ug | ORAL_TABLET | Freq: Every day | ORAL | Status: DC

## 2022-07-16 MED ORDER — SIMETHICONE 80 MG PO CHEW: 80.0000 mg | CHEWABLE_TABLET | ORAL | Status: DC | PRN

## 2022-07-16 MED ORDER — TRANEXAMIC ACID-NACL 1000-0.7 MG/100ML-% IV SOLN
INTRAVENOUS | Status: AC
  Administered 2022-07-16: 1000 mg via INTRAVENOUS
  Filled 2022-07-16: qty 100

## 2022-07-16 MED ORDER — ZOLPIDEM TARTRATE 5 MG PO TABS: 5.0000 mg | ORAL_TABLET | Freq: Every evening | ORAL | Status: DC | PRN

## 2022-07-16 MED ORDER — TRANEXAMIC ACID-NACL 1000-0.7 MG/100ML-% IV SOLN: 1000.0000 mg | Freq: Once | INTRAVENOUS | Status: AC | PRN

## 2022-07-16 MED ORDER — SENNOSIDES-DOCUSATE SODIUM 8.6-50 MG PO TABS
2.0000 | ORAL_TABLET | ORAL | Status: DC
  Administered 2022-07-16 – 2022-07-18 (×3): 2 via ORAL
  Filled 2022-07-16 (×3): qty 2

## 2022-07-16 MED ORDER — WITCH HAZEL-GLYCERIN EX PADS: 1.0000 | MEDICATED_PAD | CUTANEOUS | Status: DC | PRN

## 2022-07-16 MED ORDER — COCONUT OIL OIL: 1.0000 | TOPICAL_OIL | Status: DC | PRN

## 2022-07-16 MED ORDER — ONDANSETRON HCL 4 MG/2ML IJ SOLN: 4.0000 mg | INTRAMUSCULAR | Status: DC | PRN

## 2022-07-16 MED ORDER — LIDOCAINE-EPINEPHRINE (PF) 2 %-1:200000 IJ SOLN
INTRAMUSCULAR | Status: DC | PRN
  Administered 2022-07-16: 5 mL via EPIDURAL

## 2022-07-16 MED ORDER — BENZOCAINE-MENTHOL 20-0.5 % EX AERO: 1.0000 | INHALATION_SPRAY | CUTANEOUS | Status: DC | PRN

## 2022-07-16 MED ORDER — IBUPROFEN 600 MG PO TABS
600.0000 mg | ORAL_TABLET | Freq: Four times a day (QID) | ORAL | Status: DC
  Administered 2022-07-16 – 2022-07-18 (×9): 600 mg via ORAL
  Filled 2022-07-16 (×9): qty 1

## 2022-07-16 MED ORDER — PRENATAL MULTIVITAMIN CH
1.0000 | ORAL_TABLET | Freq: Every day | ORAL | Status: DC
  Administered 2022-07-16 – 2022-07-18 (×3): 1 via ORAL
  Filled 2022-07-16 (×3): qty 1

## 2022-07-16 MED ORDER — ONDANSETRON HCL 4 MG PO TABS: 4.0000 mg | ORAL_TABLET | ORAL | Status: DC | PRN

## 2022-07-16 MED ORDER — DIBUCAINE (PERIANAL) 1 % EX OINT: 1.0000 | TOPICAL_OINTMENT | CUTANEOUS | Status: DC | PRN

## 2022-07-16 MED ORDER — ACETAMINOPHEN 325 MG PO TABS
650.0000 mg | ORAL_TABLET | ORAL | Status: DC | PRN
  Administered 2022-07-17: 650 mg via ORAL
  Filled 2022-07-16: qty 2

## 2022-07-16 MED ORDER — TETANUS-DIPHTH-ACELL PERTUSSIS 5-2.5-18.5 LF-MCG/0.5 IM SUSY: 0.5000 mL | PREFILLED_SYRINGE | Freq: Once | INTRAMUSCULAR | Status: DC

## 2022-07-16 MED ORDER — DIPHENHYDRAMINE HCL 25 MG PO CAPS: 25.0000 mg | ORAL_CAPSULE | Freq: Four times a day (QID) | ORAL | Status: DC | PRN

## 2022-07-16 NOTE — Progress Notes (Signed)
Labor Progress Note Tina Melton is a 37 y.o. [id number] at [redacted]w[redacted]d presented for IOL due to gHTN S: feeling lots of pain from contractions. FB out a couple hrs back.  O:  BP (!) 146/85   Pulse 67   Temp 98.5 F (36.9 C) (Oral)   Resp 16   Ht 5\' 11"  (1.803 m)   Wt (!) 149 kg   LMP 10/29/2021   SpO2 99%   BMI 45.80 kg/m   EFM: 120bpm/moderate variability/+accels, no decels.  CVE: Dilation: 7.5 Effacement (%): 80 Cervical Position: Middle Station: -1 Presentation: Vertex Exam by:: Lonzo Cloud RN   A&P: 37 y.o. [id number] [redacted]w[redacted]d IOL for pre-eclampsia with severe features #Labor: making progress, s/p cytotec X 1, FB. Will like to get comfortable with an epdiural and then can plan to AROM.  #Pain: family support, IV fentanyl, epidural when ready  #FWB: cat 1. #GBS positive - PCN  Pre-eclampsia with severe features: Continue IV mag sulf for seizure ppx - start PO procardia XL 30mg  daily - check labs: CBC, CMP, Mg about 5am tomorrow.   Sheppard Evens MD MPH OB Fellow, Faculty Practice [facility], Center for [facility] 07/16/2022

## 2022-07-16 NOTE — Anesthesia Preprocedure Evaluation (Signed)
Anesthesia Evaluation  Patient identified by MRN, date of birth, ID band Patient awake    Reviewed: Allergy & Precautions, NPO status , Patient's Chart, lab work & pertinent test results  Airway Mallampati: II  TM Distance: >3 FB Neck ROM: Full    Dental no notable dental hx.  Uppper and lower braces:   Pulmonary neg pulmonary ROS   Pulmonary exam normal breath sounds clear to auscultation       Cardiovascular hypertension (preE with severe features on mag), Pt. on medications Normal cardiovascular exam Rhythm:Regular Rate:Normal     Neuro/Psych negative neurological ROS  negative psych ROS   GI/Hepatic negative GI ROS, Neg liver ROS,,,  Endo/Other  Hypothyroidism  Morbid obesity (BMI 46)  Renal/GU negative Renal ROS  negative genitourinary   Musculoskeletal negative musculoskeletal ROS (+)    Abdominal   Peds  Hematology negative hematology ROS (+)   Anesthesia Other Findings   Reproductive/Obstetrics (+) Pregnancy                             Anesthesia Physical Anesthesia Plan  ASA: 3  Anesthesia Plan: Epidural   Post-op Pain Management:    Induction:   PONV Risk Score and Plan: Treatment may vary due to age or medical condition  Airway Management Planned: Natural Airway  Additional Equipment:   Intra-op Plan:   Post-operative Plan:   Informed Consent: I have reviewed the patients History and Physical, chart, labs and discussed the procedure including the risks, benefits and alternatives for the proposed anesthesia with the patient or authorized representative who has indicated his/her understanding and acceptance.       Plan Discussed with: Anesthesiologist  Anesthesia Plan Comments: (Patient identified. Risks, benefits, options discussed with patient including but not limited to bleeding, infection, nerve damage, paralysis, failed block, incomplete pain control,  headache, blood pressure changes, nausea, vomiting, reactions to medication, itching, and post partum back pain. Confirmed with bedside nurse the patient's most recent platelet count. Confirmed with the patient that they are not taking any anticoagulation, have any bleeding history or any family history of bleeding disorders. Patient expressed understanding and wishes to proceed. All questions were answered. )       Anesthesia Quick Evaluation

## 2022-07-16 NOTE — Anesthesia Procedure Notes (Signed)
Epidural Patient location during procedure: OB Start time: 07/16/2022 2:40 AM End time: 07/16/2022 2:50 AM  Staffing Anesthesiologist: Elmer Picker, MD Performed: anesthesiologist   Preanesthetic Checklist Completed: patient identified, IV checked, risks and benefits discussed, monitors and equipment checked, pre-op evaluation and timeout performed  Epidural Patient position: sitting Prep: DuraPrep and site prepped and draped Patient monitoring: continuous pulse ox, blood pressure, heart rate and cardiac monitor Approach: midline Location: L3-L4 Injection technique: LOR air  Needle:  Needle type: Tuohy  Needle gauge: 17 G Needle length: 9 cm Needle insertion depth: 8 cm Catheter type: closed end flexible Catheter size: 19 Gauge Catheter at skin depth: 14 cm Test dose: negative  Assessment Sensory level: T8 Events: blood not aspirated, no cerebrospinal fluid, injection not painful, no injection resistance, no paresthesia and negative IV test  Additional Notes Patient identified. Risks/Benefits/Options discussed with patient including but not limited to bleeding, infection, nerve damage, paralysis, failed block, incomplete pain control, headache, blood pressure changes, nausea, vomiting, reactions to medication both or allergic, itching and postpartum back pain. Confirmed with bedside nurse the patient's most recent platelet count. Confirmed with patient that they are not currently taking any anticoagulation, have any bleeding history or any family history of bleeding disorders. Patient expressed understanding and wished to proceed. All questions were answered. Sterile technique was used throughout the entire procedure. Please see nursing notes for vital signs. Test dose was given through epidural catheter and negative prior to continuing to dose epidural or start infusion. Warning signs of high block given to the patient including shortness of breath, tingling/numbness in hands,  complete motor block, or any concerning symptoms with instructions to call for help. Patient was given instructions on fall risk and not to get out of bed. All questions and concerns addressed with instructions to call with any issues or inadequate analgesia.  Reason for block:procedure for pain

## 2022-07-16 NOTE — Anesthesia Postprocedure Evaluation (Signed)
Anesthesia Post Note  Patient: Tina Melton  Procedure(s) Performed: AN AD HOC LABOR EPIDURAL     Patient location during evaluation: [facility] Anesthesia Type: Epidural Level of consciousness: awake and alert Pain management: satisfactory to patient Vital Signs Assessment: post-procedure vital signs reviewed and stable Respiratory status: spontaneous breathing and respiratory function stable Cardiovascular status: stable Postop Assessment: adequate PO intake Anesthetic complications: no   No notable events documented.  Last Vitals:  Vitals:   07/16/22 1900 07/16/22 1941  BP:  (!) 145/77  Pulse:  81  Resp: 18 16  Temp:  37 C  SpO2:  98%    Last Pain:  Vitals:   07/16/22 1941  TempSrc: Oral  PainSc:    Pain Goal: Patients Stated Pain Goal: 2 (07/15/22 2254)                 Molli Hazard

## 2022-07-16 NOTE — Discharge Summary (Signed)
Postpartum Discharge Summary     Patient Name: Tina Melton DOB: 06-02-1985 MRN: [id number]  Date of admission: 07/15/2022 Delivery date:07/16/2022  Delivering provider: Federico Flake  Date of discharge: 07/18/2022  Admitting diagnosis: Gestational hypertension [O13.9] Intrauterine pregnancy: [redacted]w[redacted]d     Secondary diagnosis:  Principal Problem:   Gestational hypertension Active Problems:   Maternal morbid obesity, antepartum (HCC)   BMI 40.0-44.9, adult (HCC)   Supervision of high risk pregnancy, antepartum   Advanced maternal age in multigravida   Subclinical hypothyroidism   Group B Streptococcus carrier, +RV culture, currently pregnant   Preeclampsia   Vaginal delivery  Additional problems: None    Discharge diagnosis: Term Pregnancy Delivered and Preeclampsia (severe)                                              Post partum procedures: none Augmentation: AROM and Cytotec Complications: None  Hospital course: Induction of Labor With Vaginal Delivery   37 y.o. yo [id number] at [redacted]w[redacted]d was admitted to the hospital 07/15/2022 for induction of labor.  Indication for induction: Gestational hypertension.  Patient had an labor course complicated by development of Preeclampsia w/ severe features requiring magnesium.  Membrane Rupture Time/Date: 7:56 AM ,07/16/2022   Delivery Method:Vaginal, Spontaneous  Episiotomy: None  Lacerations:  None  Details of delivery can be found in separate delivery note.  Patient had a postpartum course complicated by Magnesium x 24 hours, BP stabilized on oral Lasix and po Procardia. Patient is discharged home 07/18/22.  Newborn Data: Birth date:07/16/2022  Birth time:9:21 AM  Gender:Female  Living status:Living  Apgars:7 ,9  Weight:2700 g   Magnesium Sulfate received: Yes: Seizure prophylaxis BMZ received: No Rhophylac:N/A MMR:N/A T-DaP: Ordered postpartum  Flu: N/A Transfusion:No  Physical exam  Vitals:   07/17/22 1832 07/17/22  2022 07/17/22 2309 07/18/22 0333  BP: 131/77 (!) 142/81 134/75 139/80  Pulse: 89 84 74 83  Resp:  18 18 18   Temp:  98.8 F (37.1 C) 98.7 F (37.1 C) 98.4 F (36.9 C)  TempSrc:  Oral Oral Oral  SpO2: 99% 99%  98%  Weight:      Height:       General: alert, cooperative, and no distress Lochia: appropriate Uterine Fundus: firm Incision: N/A DVT Evaluation: No evidence of DVT seen on physical exam. Labs: Lab Results  Component Value Date   WBC 14.1 (H) 07/16/2022   HGB 11.3 (L) 07/16/2022   HCT 35.4 (L) 07/16/2022   MCV 79.6 (L) 07/16/2022   PLT 262 07/16/2022      Latest Ref Rng & Units 07/16/2022    1:27 AM  CMP  Glucose 70 - 99 mg/dL 147   BUN 6 - 20 mg/dL 9   Creatinine 8.29 - 5.62 mg/dL 1.30   Sodium 865 - 784 mmol/L 130   Potassium 3.5 - 5.1 mmol/L 4.1   Chloride 98 - 111 mmol/L 103   CO2 22 - 32 mmol/L 18   Calcium 8.9 - 10.3 mg/dL 7.9   Total Protein 6.5 - 8.1 g/dL 6.3   Total Bilirubin 0.3 - 1.2 mg/dL 0.5   Alkaline Phos 38 - 126 U/L 141   AST 15 - 41 U/L 20   ALT 0 - 44 U/L 21    Edinburgh Score:    07/17/2022    9:28 AM  Inocente Salles Postnatal  Depression Scale Screening Tool  I have been able to laugh and see the funny side of things. 0  I have looked forward with enjoyment to things. 0  I have blamed myself unnecessarily when things went wrong. 0  I have been anxious or worried for no good reason. 2  I have felt scared or panicky for no good reason. 0  Things have been getting on top of me. 0  I have been so unhappy that I have had difficulty sleeping. 0  I have felt sad or miserable. 0  I have been so unhappy that I have been crying. 0  The thought of harming myself has occurred to me. 0  Edinburgh Postnatal Depression Scale Total 2     After visit meds:  Allergies as of 07/18/2022       Reactions   Lemon Juice Anaphylaxis   Lemon Oil Anaphylaxis   Gemtesa [vibegron] Rash        Medication List     TAKE these medications     acetaminophen 325 MG tablet Commonly known as: TYLENOL Take 650 mg by mouth every 6 (six) hours as needed.   aspirin EC 81 MG tablet Take 1 tablet (81 mg total) by mouth daily. Take after 12 weeks for prevention of preeclampsia later in pregnancy   furosemide 20 MG tablet Commonly known as: LASIX Take 1 tablet (20 mg total) by mouth daily.   ibuprofen 600 MG tablet Commonly known as: ADVIL Take 1 tablet (600 mg total) by mouth every 6 (six) hours.   levothyroxine 25 MCG tablet Commonly known as: Synthroid Take 1 tablet (25 mcg total) by mouth daily before breakfast.   NIFEdipine 30 MG 24 hr tablet Commonly known as: ADALAT CC Take 1 tablet (30 mg total) by mouth daily.   prenatal multivitamin Tabs tablet Take 1 tablet by mouth daily at 12 noon.         Discharge home in stable condition Infant Feeding: Bottle and Breast Infant Disposition:home with mother Discharge instruction: per After Visit Summary and Postpartum booklet. Activity: Advance as tolerated. Pelvic rest for 6 weeks.  Diet: routine diet Future Appointments: Future Appointments  Date Time Provider Department Center  07/23/2022  1:50 PM [facility] NURSE [facility]  07/28/2022  3:15 PM Carolan Shiver, RD [facility]  08/28/2022 10:55 AM Federico Flake, MD [facility]  12/24/2022 10:00 AM Vaillancourt, Lelon Mast, PA-C [facility] None   Follow up Visit:  Follow-up Information     [facility] Follow up in 1 week(s).   Specialty: Obstetrics and Gynecology Why: blood pressure check, they will call you with an appointment Contact information: [address] [place] [address] [phone]               Message sent   Please schedule this patient for a In person postpartum visit in 6 weeks with the following provider: Any provider. Additional Postpartum F/U:BP check 1 week  High risk pregnancy  complicated by: HTN Delivery mode:  Vaginal, Spontaneous  Anticipated Birth Control:   Declines   07/18/2022 Lazaro Arms, MD

## 2022-07-16 NOTE — Lactation Note (Signed)
This note was copied from a baby's chart. Lactation Consultation Note  Patient Name: Tina Melton ZOXWR'U Date: 07/16/2022 Age:39 hours Reason for consult: Initial assessment;Infant < 6lbs;Early term 87-38.6wks Mom stated the baby BF well these last 2 times she BF but baby is very sleepy right now. Discussed ways for stimulate baby to wake up baby. Mom encouraged to feed baby 8-12 times/24 hours and with feeding cues.  Discussed mom pumping and she was in agreement. Mom has a Medela at home and has ordered a hands free one as well. Mom shown how to use DEBP & how to disassemble, clean, & reassemble parts. Mom pumped and was very happy to see colostrum. Mom stated her breast ache at night. Mom hasn't had any leaking in last trimester of pregnancy but her breast are very tender. Newborn feeding habits, STS, I&O, supply and demand reviewed. Encouraged mom to call for assistance as needed.   Maternal Data Has patient been taught Hand Expression?: Yes Does the patient have breastfeeding experience prior to this delivery?: Yes How long did the patient breastfeed?: BF her 1st child for 1 month and her 2nd child now 68 yrs old for 6 months  Feeding    LATCH Score Latch: Too sleepy or reluctant, no latch achieved, no sucking elicited.     Type of Nipple: Everted at rest and after stimulation            Lactation Tools Discussed/Used Tools: Pump;Flanges Flange Size: 21 Breast pump type: Double-Electric Breast Pump Pump Education: Setup, frequency, and cleaning;Milk Storage Reason for Pumping: less than 6 lbs Pumping frequency: q3hr Pumped volume: 2 mL  Interventions Interventions: DEBP;Breast feeding basics reviewed;Position options;Skin to skin;Breast massage;Expressed milk;Hand express;Breast compression;LC Services brochure  Discharge    Consult Status Consult Status: Follow-up Date: 07/17/22 Follow-up type: In-patient    Charyl Dancer 07/16/2022, 9:34  PM

## 2022-07-16 NOTE — Progress Notes (Signed)
Labor Progress Note Tina Melton is a 37 y.o. [id number] at [redacted]w[redacted]d presented for IOL due to gHTN S: doing well. Has gotten some rest after getting comfortable with her epidural.  O:  BP 108/70   Pulse 65   Temp 98.5 F (36.9 C) (Oral)   Resp 14   Ht 5\' 11"  (1.803 m)   Wt (!) 149 kg   LMP 10/29/2021   SpO2 98%   BMI 45.80 kg/m   EFM: 120bpm,  CVE: Dilation: 9 Effacement (%): 90 Cervical Position: Middle Station: -1 Presentation: Vertex Exam by:: Lonzo Cloud RN   A&P: 37 y.o. [id number] [redacted]w[redacted]d IOL for pre-eclampsia with severe features #Labor: making progress, s/p cytotec X 1, FB. AROM performed with clear fluid. Anticipate VD  #Pain: family support, IV fentanyl, epidural when ready  #FWB: cat 1. #GBS positive - PCN  Pre-eclampsia with severe features: Continue IV mag sulf for seizure ppx - start PO procardia XL 30mg  daily - Labs stable. Continue to monitor   Sheppard Evens MD MPH OB Fellow, Faculty Practice [facility], Center for [facility] 07/16/2022

## 2022-07-17 ENCOUNTER — Encounter (HOSPITAL_COMMUNITY): Payer: Self-pay | Admitting: Family Medicine

## 2022-07-17 ENCOUNTER — Ambulatory Visit: Payer: Medicaid Other

## 2022-07-17 MED ORDER — FUROSEMIDE 20 MG PO TABS
20.0000 mg | ORAL_TABLET | Freq: Every day | ORAL | Status: DC
  Administered 2022-07-17 – 2022-07-18 (×2): 20 mg via ORAL
  Filled 2022-07-17 (×2): qty 1

## 2022-07-17 NOTE — Lactation Note (Signed)
This note was copied from a baby's chart. Lactation Consultation Note  Patient Name: Girl Felesha Robbs ZOXWR'U Date: 07/17/2022 Age:38 hours Reason for consult: Follow-up assessment;Early term 37-38.6wks;Infant < 6lbs;Infant weight loss (experienced BF) Per mom my plan is to just breast feed only for 3 months and not supplement. LC explained the potential feeding behaviors of a ET , less than 6 pound infant.  LC reviewed the doc flow sheets , and the baby has been consistently latching and feeding 15 -40 mins and per lots of swallows.  Per mom has not pumped today. LC recommended the importance of post pumping for stimulation to enhance the fatty milk coming in, also since the weight loss is at 5 % today. LC mentioned to mom she will have her milk if the weight loss increased to supplement.    Maternal Data Does the patient have breastfeeding experience prior to this delivery?: Yes How long did the patient breastfeed?: 1st baby 1 month and her 2nd baby 6 months  Feeding Mother's Current Feeding Choice: Breast Milk  LATCH Score - Latch score between 8-9  Per mom the baby recently fed.     Lactation Tools Discussed/Used Tools: Pump Breast pump type: Double-Electric Breast Pump Pump Education: Milk Storage (LC encouraged mom to keep up her post pumping to enhance her fatty milk coming in) Reason for Pumping: Less than 6 pounds , ET  Interventions  Education   Discharge    Consult Status Consult Status: Follow-up Date: 07/18/22 Follow-up type: In-patient    Matilde Sprang Graylon Amory 07/17/2022, 2:19 PM

## 2022-07-17 NOTE — Progress Notes (Signed)
Tina Melton was referred for history of panic attacks. * Referral screened out by Clinical Social Worker because none of the following criteria appear to apply: ~ History of panic attacks during this pregnancy, or of post-partum depression following prior delivery. No concerns noted in prenatal records.  ~ Diagnosis of panic attacks within last 3 years. Per chart review, Tina Melton's history of panic attacks dates back to 2013.  OR * Tina Melton's symptoms currently being treated with medication and/or therapy.  Please contact the Clinical Social Worker if needs arise, by [facility] request, or if Tina Melton scores greater than 9/yes to question 10 on Edinburgh Postpartum Depression Screen.  Celso Sickle, LCSW Clinical Social Worker [facility] Cell#: [phone]

## 2022-07-17 NOTE — Progress Notes (Signed)
Post Partum Day 1 Subjective: Patient reports feeling well and is without any complaints. She is ambulating and tolerating a regular diet. She is voiding  Objective: Blood pressure (!) 118/58, pulse 74, temperature 97.6 F (36.4 C), temperature source Oral, resp. rate 18, height 5\' 11"  (1.803 m), weight (!) 149 kg, last menstrual period 10/29/2021, SpO2 99 %, unknown if currently breastfeeding.  Physical Exam:  General: alert, cooperative, and no distress Lochia: appropriate Uterine Fundus: firm Incision: n/a DVT Evaluation: No evidence of DVT seen on physical exam.  Recent Labs    07/16/22 0127 07/16/22 0945  HGB 11.0* 11.3*  HCT 34.2* 35.4*    Assessment/Plan: 37 yo s/p SVD with severe preeclampsia - Continue magnesium sulfate for seizure prophylaxis until 9:30am - Continue monitoring BP on lasix and procardia - Continue routine postpartum care   LOS: 2 days   Catalina Antigua, MD 07/17/2022, 9:16 AM

## 2022-07-18 ENCOUNTER — Other Ambulatory Visit: Payer: Self-pay | Admitting: Obstetrics & Gynecology

## 2022-07-18 MED ORDER — FUROSEMIDE 20 MG PO TABS: 20.0000 mg | ORAL_TABLET | Freq: Every day | ORAL | 0 refills | Status: DC

## 2022-07-18 MED ORDER — NIFEDIPINE ER 30 MG PO TB24: 30.0000 mg | ORAL_TABLET | Freq: Every day | ORAL | 2 refills | Status: DC

## 2022-07-18 MED ORDER — IBUPROFEN 600 MG PO TABS: 600.0000 mg | ORAL_TABLET | Freq: Four times a day (QID) | ORAL | 1 refills | Status: DC

## 2022-07-18 MED ORDER — NIFEDIPINE ER 30 MG PO TB24: 60.0000 mg | ORAL_TABLET | Freq: Every day | ORAL | 2 refills | Status: DC

## 2022-07-18 NOTE — Lactation Note (Signed)
This note was copied from a baby's chart. Lactation Consultation Note  Patient Name: Tina Melton Date: 07/18/2022 Age:39 hours Reason for consult: Follow-up assessment;Early term 37-38.6wks;Infant < 6lbs;Infant weight loss;Hyperbilirubinemia (7 % weightt loss, Serum Bilirubin elevated and repeat at this afternoon at 1500 per mom and nurse. LC reviewed and updated the doc flow sheets per mom.) Pending Bilirubin this after noon depends on baby being D/C .  Mom aware to feed with feeding cues and by 3 hours STS to the baby is more active with her feeding.  LC reviewed the steps for latching and the importance of making sure the baby obtained the depth every feeding.  LC discussed effects of juandice and the making the baby potentially sluggish if the level has increased to a high level.  LC stressed the importance of post pumping after the baby has settled to bring the fatty milk in quicker for weight gain and decreasing jaundice level, also for milk to work with is the baby is to sluggish to latch.  Just in case mom and baby are D/C later today , LC reviewed sore nipple and engorgement prevention and tx.   Maternal Data    Feeding Mother's Current Feeding Choice: Breast Milk  LATCH Score - LC unable to assess a latch , baby recently fed     Lactation Tools Discussed/Used Tools: Pump Flange Size: 21 Breast pump type: Double-Electric Breast Pump Pump Education: Milk Storage Pumped volume:  (per mom recently pumped and with a small amount)  Interventions  Education   Discharge Discharge Education: Engorgement and breast care Pump: DEBP;Personal  Consult Status Consult Status: Follow-up Date: 07/19/22 Follow-up type: In-patient    Tina Melton 07/18/2022, 1:52 PM

## 2022-07-20 ENCOUNTER — Ambulatory Visit: Payer: Medicaid Other

## 2022-07-20 LAB — SURGICAL PATHOLOGY

## 2022-07-23 ENCOUNTER — Encounter: Payer: Medicaid Other | Admitting: Family Medicine

## 2022-07-23 ENCOUNTER — Ambulatory Visit (INDEPENDENT_AMBULATORY_CARE_PROVIDER_SITE_OTHER): Payer: Medicaid Other | Admitting: *Deleted

## 2022-07-23 VITALS — BP 149/84 | HR 79

## 2022-07-23 DIAGNOSIS — O133 Gestational [pregnancy-induced] hypertension without significant proteinuria, third trimester: Secondary | ICD-10-CM

## 2022-07-23 DIAGNOSIS — Z0130 Encounter for examination of blood pressure without abnormal findings: Secondary | ICD-10-CM

## 2022-07-23 NOTE — Progress Notes (Signed)
Subjective:  Tina Melton is a 37 y.o. female here for BP check.  Pt is not taking the Adalat.   Hypertension ROS: Patient denies any headaches, visual symptoms, RUQ/epigastric pain or other concerning symptoms.  Objective:  LMP 10/29/2021   Appearance alert, well appearing, and in no distress. General exam BP noted to be elevated today in office.    Assessment:   Blood Pressure needs improvement.   Plan:  Pt will start to take medication  and come back next week for a BP check.  Scheryl Marten, RN

## 2022-07-27 ENCOUNTER — Ambulatory Visit: Payer: Medicaid Other

## 2022-07-28 ENCOUNTER — Other Ambulatory Visit: Payer: Self-pay

## 2022-07-28 ENCOUNTER — Ambulatory Visit (INDEPENDENT_AMBULATORY_CARE_PROVIDER_SITE_OTHER): Payer: Medicaid Other | Admitting: Registered"

## 2022-07-28 ENCOUNTER — Encounter: Payer: Medicaid Other | Attending: Obstetrics and Gynecology | Admitting: Registered"

## 2022-07-28 ENCOUNTER — Other Ambulatory Visit: Payer: Medicaid Other | Admitting: Registered"

## 2022-07-28 DIAGNOSIS — E119 Type 2 diabetes mellitus without complications: Secondary | ICD-10-CM | POA: Diagnosis present

## 2022-07-28 DIAGNOSIS — Z713 Dietary counseling and surveillance: Secondary | ICD-10-CM | POA: Insufficient documentation

## 2022-07-28 DIAGNOSIS — E669 Obesity, unspecified: Secondary | ICD-10-CM | POA: Diagnosis not present

## 2022-07-28 DIAGNOSIS — Z6841 Body Mass Index (BMI) 40.0 and over, adult: Secondary | ICD-10-CM | POA: Diagnosis not present

## 2022-07-28 NOTE — Patient Instructions (Signed)
Your goals: Start document Genesis' sleep schedule so you can get yourself on a schedule. Be sure to schedule in sleep for yourself.  Great job on continuing to eat healthy eating and drinking plenty of water.  Consider scheduling a visit with our lactation specialist.

## 2022-07-28 NOTE — Progress Notes (Unsigned)
Medical Nutrition Therapy follow-up Appointment Start time:  20  Appointment End time:  1412  Primary concerns today: continue making healthy diet and lifestyle changes Referral diagnosis: R63.5 Preferred learning style: no preference indicated Learning readiness: ready, change in progress  Postpartum; 07/15/22 delivery date   NUTRITION ASSESSMENT  Anthropometrics  Wt Readings from Last 3 Encounters:  07/15/22 (!) 328 lb 6.4 oz (149 kg)  07/15/22 (!) 327 lb (148.3 kg)  07/08/22 (!) 323 lb (146.5 kg)   Medical Hx: pre-eclampsia in prior pregnancy Medications: synthroid (not taking), aspirin (not taking) Labs: A1c 5.1 initial prenatal 01/14/22, TSH 0.334(L) Notable Signs/Symptoms: exhausted since delivery  Lifestyle & Dietary Hx Pt states her goal is to get back into the gym at the end of the month and continue to eat well to lose her pregnancy weight.  Pt states when she went to her last prenatal visit her blood pressure was high so she was told to go to the hospital and she delivered the next day.  Pt states she is having difficulty with producing enough milk. Pt reports her baby is nursing or she is pumping every 2-3 hours. Pt states sometimes only gets 1 oz breast milk. Pt states she only uses formula if she has not produced enough milk. (Sensitive Similac). Pt states she also is motivated to just use breast milk because her baby girl has more gas and is more fussy with formula.  Pt states she is working from home about 28 hrs and having hard time fitting in enough sleep and time to keep up with chores around house.  Pt states while she was away from home her dad stayed with her sons and now she is out of groceries, will stop by grocery store after appt. Pt states she continues to eat balanced meals, but having to fix easy meals because not getting enough time or sleep to prepare more complicated meals. Sample meals: Chicken wraps (spinach) with lettuce, tomato, onions, taziki  sauce. Variety of vegetables. Soup and salads helps with regularly. Grilled cheese and tomato basil yesterday.  Pt reports she was told to wait 6 weeks before getting back into her exercise routine, but she plans and starting back slow at around 4 weeks, she is anxious to get into her routine again.  Note from first visit: Pt reports the following schedule: 4:30 am gets up to go workout 2 days a week with a trainer, other days starts work on her computer. 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 Takes kids to sports and other activities (eg swimming) Get home around 6:30-7 and cooks dinner, was cooking nightly now a few days a week.  Estimated daily fluid intake: "a lot" of  water Supplements: not assessed - waiting to receive supplement to help her produce more milk, recommended by a friend. Sleep: broken sleep due to new baby and breastfeeding Stress / self-care: not assessed Current average weekly physical activity: was told to wait 6 weeks after delivery but probably start after 4 weeks.  24-Hr Dietary Recall First Meal: bagel with [place] sausage, eggs Snack:   Second Meal: (plans to get something after visit) Snack:  Third Meal: Caesar salad blackened chicken, parm cheese    Snack: none Beverages: 4 bottles of water, ginger ale the other day  NUTRITION DIAGNOSIS  NB-1.4 Self-monitoring deficit as related to sleep needs as evidenced by stated plans for returning to  60+ hrs per work week and household chores which do not leave adequate time for rest and self-care.  NUTRITION INTERVENTION  Nutrition education (E-1) on the following topics:  Prioritizing sleep Increased energy needs for lactation Lactation specialist resource  Handouts Provided Include  none  Learning Style & Readiness for Change Teaching method utilized: Visual & Auditory  Demonstrated degree of understanding via: Teach Back   Barriers to learning/adherence to lifestyle change: none  Goals Established by Pt Start document Genesis' sleep schedule so you can get yourself on a schedule. Be sure to schedule in sleep for yourself.  Great job on continuing to eat healthy eating and drinking plenty of water.  Consider scheduling a visit with our lactation specialist.   MONITORING & EVALUATION Dietary intake, weekly physical activity, and sleep in 5 weeks.

## 2022-07-29 ENCOUNTER — Encounter: Payer: Medicaid Other | Admitting: Obstetrics and Gynecology

## 2022-07-29 ENCOUNTER — Ambulatory Visit (INDEPENDENT_AMBULATORY_CARE_PROVIDER_SITE_OTHER): Payer: Medicaid Other

## 2022-07-29 VITALS — BP 162/97

## 2022-07-29 DIAGNOSIS — O165 Unspecified maternal hypertension, complicating the puerperium: Secondary | ICD-10-CM

## 2022-07-29 MED ORDER — LABETALOL HCL 200 MG PO TABS
200.0000 mg | ORAL_TABLET | Freq: Two times a day (BID) | ORAL | 0 refills | Status: DC
Start: 2022-07-29 — End: 2022-08-28

## 2022-07-29 NOTE — Progress Notes (Signed)
Subjective:  Tina Melton is a 37 y.o. female here for BP check.   Hypertension ROS: Patient denies any headaches, visual symptoms, RUQ/epigastric pain or other concerning symptoms.  Objective:  BP (!) 162/97   LMP 10/29/2021   Appearance alert, well appearing, and in no distress. General exam BP noted to be 162/97 today in office.    Assessment:   Blood Pressure needs further observation.   Plan:  Follow up for Recheck BP visit in 1 week. Labs ordered today along with medication send into pharmacy per Dr.Picken's

## 2022-07-30 ENCOUNTER — Encounter: Payer: Medicaid Other | Admitting: Advanced Practice Midwife

## 2022-07-30 LAB — COMPREHENSIVE METABOLIC PANEL
ALT: 17 IU/L (ref 0–32)
AST: 16 IU/L (ref 0–40)
Albumin/Globulin Ratio: 1.4
Albumin: 3.8 g/dL — ABNORMAL LOW (ref 3.9–4.9)
Alkaline Phosphatase: 82 IU/L (ref 44–121)
BUN/Creatinine Ratio: 17 (ref 9–23)
BUN: 13 mg/dL (ref 6–20)
Bilirubin Total: 0.7 mg/dL (ref 0.0–1.2)
CO2: 19 mmol/L — ABNORMAL LOW (ref 20–29)
Calcium: 8.7 mg/dL (ref 8.7–10.2)
Chloride: 105 mmol/L (ref 96–106)
Creatinine, Ser: 0.78 mg/dL (ref 0.57–1.00)
Globulin, Total: 2.7 g/dL (ref 1.5–4.5)
Glucose: 83 mg/dL (ref 70–99)
Potassium: 3.8 mmol/L (ref 3.5–5.2)
Sodium: 141 mmol/L (ref 134–144)
Total Protein: 6.5 g/dL (ref 6.0–8.5)
eGFR: 99 mL/min/{1.73_m2} (ref >59)

## 2022-07-30 LAB — CBC
Hematocrit: 33.4 % — ABNORMAL LOW (ref 34.0–46.6)
Hemoglobin: 10.7 g/dL — ABNORMAL LOW (ref 11.1–15.9)
MCH: 25.2 pg — ABNORMAL LOW (ref 26.6–33.0)
MCHC: 32 g/dL (ref 31.5–35.7)
MCV: 79 fL (ref 79–97)
Platelets: 275 10*3/uL (ref 150–450)
RBC: 4.24 x10E6/uL (ref 3.77–5.28)
RDW: 13.6 % (ref 11.7–15.4)
WBC: 3.8 10*3/uL (ref 3.4–10.8)

## 2022-08-05 ENCOUNTER — Ambulatory Visit (INDEPENDENT_AMBULATORY_CARE_PROVIDER_SITE_OTHER): Payer: Medicaid Other | Admitting: Family Medicine

## 2022-08-05 VITALS — BP 177/117 | HR 67

## 2022-08-05 DIAGNOSIS — O133 Gestational [pregnancy-induced] hypertension without significant proteinuria, third trimester: Secondary | ICD-10-CM

## 2022-08-05 DIAGNOSIS — Z3A36 36 weeks gestation of pregnancy: Secondary | ICD-10-CM

## 2022-08-05 MED ORDER — LISINOPRIL 10 MG PO TABS
10.0000 mg | ORAL_TABLET | Freq: Every day | ORAL | 3 refills | Status: DC
Start: 2022-08-05 — End: 2022-08-28

## 2022-08-05 NOTE — Progress Notes (Signed)
Subjective:  Tina Melton is a 37 y.o. female here for BP check.   Hypertension ROS: Patient reports headaches, visual symptoms when taking B/P medication denies any  RUQ/epigastric pain or other concerning symptoms.  Objective:  LMP 10/29/2021   Appearance alert, well appearing, c/o HA's. General exam BP noted to be elevated  today in office.    Assessment:   Blood Pressure needs further observation.   Plan:  Change B/P Rx pt was not able to pick up new B/P medication and does not like side effects of original Blood Pressure medication Consulted with Dr.Pratt new medication will be sent.Dr.Pratt spoke with pt . Pt to return in 1 wk for B/P check.

## 2022-08-05 NOTE — Progress Notes (Addendum)
Patient seen and assessed by nursing staff.  Agree with documentation and plan. I spoke with patient. The Procardia is causing significant headaches. Prefer once daily regimen. Will prescribe ACE-I. Repeat BP in 1 week.

## 2022-08-12 ENCOUNTER — Ambulatory Visit (INDEPENDENT_AMBULATORY_CARE_PROVIDER_SITE_OTHER): Payer: Medicaid Other

## 2022-08-12 DIAGNOSIS — Z0131 Encounter for examination of blood pressure with abnormal findings: Secondary | ICD-10-CM

## 2022-08-12 DIAGNOSIS — O165 Unspecified maternal hypertension, complicating the puerperium: Secondary | ICD-10-CM

## 2022-08-12 MED ORDER — LISINOPRIL 20 MG PO TABS: 20.0000 mg | ORAL_TABLET | Freq: Every day | ORAL | 0 refills | Status: DC

## 2022-08-12 NOTE — Progress Notes (Signed)
Subjective:  Tina Melton is a 37 y.o. female here for BP check.   Hypertension ROS: Patient denies any headaches, visual symptoms, RUQ/epigastric pain or other concerning symptoms.  Objective:  LMP 10/29/2021   Appearance alert, well appearing, and in no distress. General exam BP noted to be 175/111 today in office.    Assessment:   Blood Pressure no significant medication side effects noted and needs improvement.   Plan:  Increase to 20mg  of lisinopril and return in 1 week for Nurse BP check  .

## 2022-08-19 ENCOUNTER — Ambulatory Visit (INDEPENDENT_AMBULATORY_CARE_PROVIDER_SITE_OTHER): Payer: Medicaid Other

## 2022-08-19 ENCOUNTER — Other Ambulatory Visit: Payer: Self-pay

## 2022-08-19 VITALS — BP 178/123 | HR 66

## 2022-08-19 DIAGNOSIS — Z0130 Encounter for examination of blood pressure without abnormal findings: Secondary | ICD-10-CM

## 2022-08-19 DIAGNOSIS — O165 Unspecified maternal hypertension, complicating the puerperium: Secondary | ICD-10-CM

## 2022-08-19 MED ORDER — BLOOD PRESSURE MONITOR/L CUFF MISC: 1.0000 | 0 refills | Status: DC

## 2022-08-19 NOTE — Progress Notes (Signed)
Subjective:  Tina Melton is a 37 y.o. female here for BP check.   Hypertension ROS: Patient denies any headaches, visual symptoms, RUQ/epigastric pain or other concerning symptoms.  Objective:  LMP 10/29/2021   Appearance alert, well appearing, and in no distress. General exam BP noted to be Elevated today in office.    Assessment:   Blood Pressure needs improvement.   Plan:  Message sent to Dr.Pratt to advise .

## 2022-08-21 DIAGNOSIS — Z3480 Encounter for supervision of other normal pregnancy, unspecified trimester: Secondary | ICD-10-CM | POA: Diagnosis not present

## 2022-08-25 ENCOUNTER — Other Ambulatory Visit: Payer: Medicaid Other

## 2022-08-26 ENCOUNTER — Other Ambulatory Visit: Payer: Self-pay | Admitting: Obstetrics and Gynecology

## 2022-08-28 ENCOUNTER — Encounter: Payer: Self-pay | Admitting: Family Medicine

## 2022-08-28 ENCOUNTER — Ambulatory Visit (INDEPENDENT_AMBULATORY_CARE_PROVIDER_SITE_OTHER): Payer: Medicaid Other | Admitting: Family Medicine

## 2022-08-28 DIAGNOSIS — O1493 Unspecified pre-eclampsia, third trimester: Secondary | ICD-10-CM

## 2022-08-28 DIAGNOSIS — O165 Unspecified maternal hypertension, complicating the puerperium: Secondary | ICD-10-CM

## 2022-08-28 DIAGNOSIS — Z8759 Personal history of other complications of pregnancy, childbirth and the puerperium: Secondary | ICD-10-CM

## 2022-08-28 NOTE — Assessment & Plan Note (Addendum)
Patient has had pp elevated BPs, still elevated  - Discussed adding hydrochlorothiazide, patient is hesitant and feels that as weight returns to pre-pregnancy her BP "always improves" - She denies HA/blurry vision - Recommended BP check in 2-3 weeks, if still elevated recommend hydrochlorothiazide.  - Reviewed safety of lisinopril in breastfeeding but NOT pregnancy. Patient had SE for procardia and labetalol - Patient was referred to [facility] cardiology as well

## 2022-08-28 NOTE — Progress Notes (Signed)
Post Partum Visit Note  Tina Melton is a 37 y.o. [phone] female who presents for a postpartum visit. She is 6 weeks postpartum following a normal spontaneous vaginal delivery.  I have fully reviewed the prenatal and intrapartum course. The delivery was at 37 gestational weeks.  Anesthesia: epidural. . Baby is doing well. Baby is feeding by both breast and bottle - Similac Sensitive RS. Bleeding no bleeding may be starting cycle. Bowel function is normal. Bladder function is normal. Patient is sexually active. Contraception method is none. Postpartum depression screening: negative.  The pregnancy intention screening data noted above was reviewed. Potential methods of contraception were discussed. The patient elected to proceed with No data recorded.   Edinburgh Postnatal Depression Scale - 08/28/22 1110       Edinburgh Postnatal Depression Scale:  In the Past 7 Days   I have been able to laugh and see the funny side of things. 0    I have looked forward with enjoyment to things. 0    I have blamed myself unnecessarily when things went wrong. 0    I have been anxious or worried for no good reason. 0    I have felt scared or panicky for no good reason. 0    Things have been getting on top of me. 0    I have been so unhappy that I have had difficulty sleeping. 0    I have felt sad or miserable. 0    I have been so unhappy that I have been crying. 0    The thought of harming myself has occurred to me. 0    Edinburgh Postnatal Depression Scale Total 0             Health Maintenance Due  Topic Date Due   DTaP/Tdap/Td (2 - Td or Tdap) 04/18/2021   COVID-19 Vaccine (1 - 2023-24 season) Never done    The following portions of the patient's history were reviewed and updated as appropriate: allergies, current medications, past family history, past medical history, past social history, past surgical history, and problem list.  Review of Systems Pertinent items are noted in  HPI.  Objective:  BP (!) 152/85   Pulse 72   Wt 293 lb (132.9 kg)   LMP 10/29/2021   Breastfeeding Yes   BMI 40.87 kg/m    General:  alert, cooperative, and appears stated age   Breasts:  not indicated  Lungs: clear to auscultation bilaterally  Heart:  regular rate and rhythm, S1, S2 normal, no murmur, click, rub or gallop  Abdomen: soft, non-tender; bowel sounds normal; no masses,  no organomegaly   Wound NA   GU exam:  not indicated       Assessment:   Normal postpartum exam.   Plan:   Essential components of care per ACOG recommendations:  1.  Mood and well being: Patient with negative depression screening today. Reviewed local resources for support.  - Patient tobacco use? No.   - hx of drug use? No.    2. Infant care and feeding:  -Patient currently breastmilk feeding? Yes. Reviewed importance of draining breast regularly to support lactation.   - Having fluctuating volumes from 1- 6 to 7oz. She reports some pumps she will get 4oz every 2-3 hours but then other times only 1oz every 3. She pumps at work with a DEBP. She is drinking copious water. She was fitted by [facility] in hospital to 21 flanges and has continued to use  these without changing. She has not latched the baby for a couple weeks at this point.  -Social determinants of health (SDOH) reviewed in EPIC. No concerns  3. Sexuality, contraception and birth spacing - Patient does not want a pregnancy in the next year.  Desired family size is 4 children.  - Reviewed reproductive life planning. Reviewed contraceptive methods based on pt preferences and effectiveness.  Patient desired Abstinence and Female Condom today.   - Discussed birth spacing of 18 months  4. Sleep and fatigue -Encouraged family/partner/community support of 4 hrs of uninterrupted sleep to help with mood and fatigue  5. Physical Recovery  - Discussed patients delivery and complications. She describes her labor as good. - Patient had a Vaginal, no  problems at delivery. Patient had no laceration. Perineal healing reviewed. Patient expressed understanding - Patient has urinary incontinence? No. - Patient is safe to resume physical and sexual activity  6.  Health Maintenance - HM due items addressed Yes - Last pap smear  Diagnosis  Date Value Ref Range Status  01/14/2022 - Benign reactive/reparative changes  Final  01/14/2022   Final   - Negative for Intraepithelial Lesions or Malignancy (NILM)   Pap smear done at today's visit.  -Breast Cancer screening indicated? No.   7. Chronic Disease/Pregnancy Condition follow up: Hypertension  1. History of gestational hypertension - Patient has had pp elevated BPs, still elevated  - Discussed adding hydrochlorothiazide, patient is hesitant and feels that as weight returns to pre-pregnancy her BP "always improves" - She denies HA/blurry vision - Recommended BP check in 2-3 weeks, if still elevated recommend hydrochlorothiazide.  - Reviewed safety of lisinopril in breastfeeding but NOT pregnancy. Patient had SE for procardia and labetalol - Patient was referred to [facility] cardiology as well -AMB Referral to Cardio Obstetrics  2. Pre-eclampsia in third trimester - AMB Referral to Cardio Obstetrics  3. Postpartum hypertension - AMB Referral to Cardio Obstetrics  4. Postpartum exam - Scheduled with LC at [facility]- needs pump flange resize. Instructed to bring pump and baby.    - PCP follow up  Future Appointments  Date Time Provider Department Center  09/17/2022 10:15 AM [facility]LA[facility]ATION CONSULTANT [facility]  09/23/2022 10:45 AM [facility] NURSE [facility]  12/24/2022 10:00 AM Vaillancourt, Lelon Mast, PA-C [facility] None    Federico Flake, MD [facility] [employer] [facility]

## 2022-09-16 ENCOUNTER — Other Ambulatory Visit: Payer: Self-pay | Admitting: Lactation Services

## 2022-09-16 DIAGNOSIS — Z7189 Other specified counseling: Secondary | ICD-10-CM

## 2022-09-23 ENCOUNTER — Ambulatory Visit (INDEPENDENT_AMBULATORY_CARE_PROVIDER_SITE_OTHER): Payer: Medicaid Other

## 2022-09-23 VITALS — BP 174/121 | HR 85 | Wt 294.0 lb

## 2022-09-23 DIAGNOSIS — O165 Unspecified maternal hypertension, complicating the puerperium: Secondary | ICD-10-CM

## 2022-09-23 DIAGNOSIS — Z0131 Encounter for examination of blood pressure with abnormal findings: Secondary | ICD-10-CM

## 2022-09-23 NOTE — Progress Notes (Signed)
Subjective:  Tina Melton is a 37 y.o. female here for BP check.   Hypertension ROS: Patient denies any headaches, visual symptoms, RUQ/epigastric pain or other concerning symptoms.  Objective:  BP (!) 174/121   Pulse 85   Appearance alert, well appearing, and in no distress. General exam BP noted to be elevated today in office.    Assessment:   Blood Pressure needs further observation.   Plan:  Schedule pt with a PCP to manage B/P. Pt to continue Lisinopril.  Marland Kitchen

## 2022-10-02 ENCOUNTER — Ambulatory Visit: Payer: Medicaid Other | Admitting: Cardiology

## 2022-10-02 ENCOUNTER — Other Ambulatory Visit: Payer: Self-pay | Admitting: Lactation Services

## 2022-10-02 VITALS — BP 160/102 | HR 75 | Ht 71.0 in | Wt 296.9 lb

## 2022-10-02 DIAGNOSIS — R9431 Abnormal electrocardiogram [ECG] [EKG]: Secondary | ICD-10-CM | POA: Diagnosis not present

## 2022-10-02 DIAGNOSIS — Z8759 Personal history of other complications of pregnancy, childbirth and the puerperium: Secondary | ICD-10-CM | POA: Diagnosis not present

## 2022-10-02 DIAGNOSIS — O165 Unspecified maternal hypertension, complicating the puerperium: Secondary | ICD-10-CM

## 2022-10-02 DIAGNOSIS — Z136 Encounter for screening for cardiovascular disorders: Secondary | ICD-10-CM

## 2022-10-02 MED ORDER — PRENATAL PLUS 27-1 MG PO TABS: 1.0000 | ORAL_TABLET | Freq: Every day | ORAL | 11 refills | Status: DC

## 2022-10-02 NOTE — Patient Instructions (Signed)
Medication Instructions:  Your physician recommends that you continue on your current medications as directed. Please refer to the Current Medication list given to you today.  *If you need a refill on your cardiac medications before your next appointment, please call your pharmacy*   Lab Work: None ordered   If you have labs (blood work) drawn today and your tests are completely normal, you will receive your results only by: MyChart Message (if you have MyChart) OR A paper copy in the mail If you have any lab test that is abnormal or we need to change your treatment, we will call you to review the results.   Testing/Procedures: Your physician has requested that you have an echocardiogram. Echocardiography is a painless test that uses sound waves to create images of your heart. It provides your doctor with information about the size and shape of your heart and how well your heart's chambers and valves are working. This procedure takes approximately one hour. There are no restrictions for this procedure. Please do NOT wear cologne, perfume, aftershave, or lotions (deodorant is allowed). Please arrive 15 minutes prior to your appointment time.    Follow-Up: At [facility], you and your health needs are our priority.  As part of our continuing mission to provide you with exceptional heart care, we have created designated Provider Care Teams.  These Care Teams include your primary Cardiologist (physician) and Advanced Practice Providers (APPs -  Physician Assistants and Nurse Practitioners) who all work together to provide you with the care you need, when you need it.  We recommend signing up for the patient portal called "MyChart".  Sign up information is provided on this After Visit Summary.  MyChart is used to connect with patients for Virtual Visits (Telemedicine).  Patients are able to view lab/test results, encounter notes, upcoming appointments, etc.  Non-urgent messages can be  sent to your provider as well.   To learn more about what you can do with MyChart, go to [email or web address].    Your next appointment:   8 week(s)  Provider:   Dr. Thomasene Ripple, DO at the [facility]  Other Instructions Your physician has referred you to see Cheree Ditto, [facility]

## 2022-10-02 NOTE — Progress Notes (Unsigned)
Cardio-Obstetrics Clinic  New Evaluation  Date:  10/04/2022   ID:  Tina Melton, DOB [birth year], MRN [id number]  PCP:  Practice, [facility]   [facility] Providers Cardiologist:  Thomasene Ripple, DO  Electrophysiologist:  None       Referring MD: Federico Flake,*   Chief Complaint: " I am ok"  History of Present Illness:    Tina Melton is a 37 y.o. female [[id number]] who is being seen today for the evaluation of chronic hypertension in at the request of Federico Flake,*.   Medical history includes hypertension, morbid obesity.  She has since been started back on her lisinopril in the postpartum period.  She started to take the medication noticed that her blood pressure was getting better and then stopped it recently.  She is hypertensive in the office.    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   History of chicken pox    History of chlamydia    History of hematuria 09/18/2021   History of hemorrhage specific to perinatal period    6-7 mos bleeding x2, heavy bldg with 2nd preg was told baby nicked 2 blood vessels   History of pre-eclampsia in prior pregnancy, currently pregnant 01/14/2022   Baseline labs  ASA   Obesity    Panic attacks    Pregnancy induced hypertension    Pregnancy with history of ectopic pregnancy, antepartum 12/11/2021   Proteinuria 04/17/2011   Yeast infection     Past Surgical History:  Procedure Laterality Date   INDUCED ABORTION     LAPAROSCOPIC UNILATERAL SALPINGECTOMY Left 08/11/2021   Procedure: LAPAROSCOPIC LEFT SALPINGECTOMY WITH REMOVAL OF ECTOPIC PREGNANCY;  Surgeon: [place] Bing, MD;  Location: [facility] OR;  Service: Gynecology;  Laterality: Left;    LIPOMA EXCISION Right 10/21/2021   Procedure: EXCISION LIPOMA;  Surgeon: Henrene Dodge, MD;  Location: [facility] ORS;  Service: General;  Laterality: Right;      OB History     Gravida  9   Para  3   Term  3   Preterm  0   AB  6   Living  3      SAB  1   IAB  4   Ectopic  1   Multiple  0   Live Births  3               Current Medications: Current Meds  Medication Sig   Blood Pressure Monitoring (BLOOD PRESSURE MONITOR/L CUFF) MISC 1 Device by Does not apply route as directed.   lisinopril (ZESTRIL) 20 MG tablet Take 1 tablet (20 mg total) by mouth daily.   prenatal vitamin w/FE, FA (PRENATAL 1 + 1) 27-1 MG TABS tablet Take 1 tablet by mouth daily at 12 noon.     Allergies:   Lemon juice, Lemon oil, and Gemtesa [vibegron]   Social History   Socioeconomic History   Marital status: Single    Spouse name: Not on file   Number of children: 2   Years of education: Not on file   Highest education level: Not on file  Occupational History   Occupation: mental health therapist  Tobacco Use   Smoking status: Never   Smokeless tobacco: Never  Vaping Use  Vaping status: Never Used  Substance and Sexual Activity   Alcohol use: No   Drug use: Not Currently    Comment: marijuana years ago   Sexual activity: Yes    Birth control/protection: None  Other Topics Concern   Not on file  Social History Narrative   Not on file   Social Determinants of Health   Financial Resource Strain: Not on file  Food Insecurity: No Food Insecurity (07/15/2022)   Hunger Vital Sign    Worried About Running Out of Food in the Last Year: Never true    Ran Out of Food in the Last Year: Never true  Transportation Needs: No Transportation Needs (07/15/2022)   PRAPARE - Administrator, Civil Service (Medical): No    Lack of Transportation (Non-Medical): No  Physical Activity: Not on file  Stress: Not on file  Social Connections: Unknown (06/27/2021)   Received from [facility]   Social Network    Social Network: Not on file      Family History  Problem Relation Age of Onset   Mental illness Mother        schizophrenia and panic attacks   Rheum arthritis Father    Arthritis Father    Birth defects Son        extra digit   Hypertension Maternal Grandmother    Stroke Maternal Grandmother    Dementia Maternal Grandmother    Diabetes Maternal Grandfather    Rheum arthritis Paternal Grandmother    Arthritis Paternal Grandmother    Diabetes Paternal Grandmother    Prostate cancer Neg Hx    Bladder Cancer Neg Hx    Kidney cancer Neg Hx    Asthma Neg Hx    Heart disease Neg Hx       ROS:   Please see the history of present illness.     All other systems reviewed and are negative.   Labs/EKG Reviewed:    EKG:   EKG waws ordered today.  The ekg ordered today demonstrates sinus rhythm, HR 75  Recent Labs: 06/11/2022: TSH 0.701 07/16/2022: Magnesium 3.9 07/29/2022: ALT 17; BUN 13; Creatinine, Ser 0.78; Hemoglobin 10.7; Platelets 275; Potassium 3.8; Sodium 141   Recent Lipid Panel No results found for: "CHOL", "TRIG", "HDL", "CHOLHDL", "LDLCALC", "LDLDIRECT"  Physical Exam:    VS:  BP (!) 160/102   Pulse 75   Ht 5\' 11"  (1.803 m)   Wt 296 lb 14.4 oz (134.7 kg)   SpO2 98%   BMI 41.41 kg/m     Wt Readings from Last 3 Encounters:  10/02/22 296 lb 14.4 oz (134.7 kg)  09/23/22 294 lb (133.4 kg)  08/28/22 293 lb (132.9 kg)     GEN:  Well nourished, well developed in no acute distress HEENT: Normal NECK: No JVD; No carotid bruits LYMPHATICS: No lymphadenopathy CARDIAC: RRR, no murmurs, rubs, gallops RESPIRATORY:  Clear to auscultation without rales, wheezing or rhonchi  ABDOMEN: Soft, non-tender, non-distended MUSCULOSKELETAL:  No edema; No deformity  SKIN: Warm and dry NEUROLOGIC:  Alert and oriented x 3 PSYCHIATRIC:  Normal affect    Risk Assessment/Risk Calculators:                 ASSESSMENT & PLAN:    Chronic  hypertension with accelerated postpartum hypertension  Morbid obesity  Abnormal EKG  She is hypertensive in the office today but tell me that she has not taken her lisinopril for several days. I discuss with the patient adding  additional antihypertensive but she has a declining.  She will take her lisinopril as prescribed we will see her back in 1 to 2 weeks with our cardio be pharmacist for optimization of blood pressure medication as appropriate.  Part of follow-up visit was heavily on counseling on the detrimental effects of uncontrolled hypertension.  She expresses understanding.  Her EKG is abnormal with her longstanding hypertension will be beneficial to get an echocardiogram to assess for any structural abnormalities.  The patient understands the need to lose weight with diet and exercise. We have discussed specific strategies for this.   Patient Instructions  Medication Instructions:  Your physician recommends that you continue on your current medications as directed. Please refer to the Current Medication list given to you today.  *If you need a refill on your cardiac medications before your next appointment, please call your pharmacy*   Lab Work: None ordered   If you have labs (blood work) drawn today and your tests are completely normal, you will receive your results only by: MyChart Message (if you have MyChart) OR A paper copy in the mail If you have any lab test that is abnormal or we need to change your treatment, we will call you to review the results.   Testing/Procedures: Your physician has requested that you have an echocardiogram. Echocardiography is a painless test that uses sound waves to create images of your heart. It provides your doctor with information about the size and shape of your heart and how well your heart's chambers and valves are working. This procedure takes approximately one hour. There are no restrictions for this procedure. Please do NOT wear  cologne, perfume, aftershave, or lotions (deodorant is allowed). Please arrive 15 minutes prior to your appointment time.    Follow-Up: At [facility], you and your health needs are our priority.  As part of our continuing mission to provide you with exceptional heart care, we have created designated Provider Care Teams.  These Care Teams include your primary Cardiologist (physician) and Advanced Practice Providers (APPs -  Physician Assistants and Nurse Practitioners) who all work together to provide you with the care you need, when you need it.  We recommend signing up for the patient portal called "MyChart".  Sign up information is provided on this After Visit Summary.  MyChart is used to connect with patients for Virtual Visits (Telemedicine).  Patients are able to view lab/test results, encounter notes, upcoming appointments, etc.  Non-urgent messages can be sent to your provider as well.   To learn more about what you can do with MyChart, go to [email or web address].    Your next appointment:   8 week(s)  Provider:   Dr. Thomasene Ripple, DO at the [facility]  Other Instructions Your physician has referred you to see Cheree Ditto, [facility]    Dispo:  No follow-ups on file.   Medication Adjustments/Labs and Tests Ordered: Current medicines are reviewed at length with the patient today.  Concerns regarding medicines are outlined above.  Tests Ordered: Orders Placed This Encounter  Procedures   AMB Referral to [facility] Pharm-D   EKG 12-Lead   ECHOCARDIOGRAM COMPLETE   Medication Changes: No orders of the defined types were placed in this encounter.

## 2022-10-02 NOTE — Progress Notes (Signed)
PNV reordered per standing order for BF mother.

## 2022-10-22 ENCOUNTER — Encounter: Payer: Self-pay | Admitting: Pharmacist

## 2022-10-22 ENCOUNTER — Ambulatory Visit: Payer: Medicaid Other | Attending: Internal Medicine | Admitting: Pharmacist

## 2022-10-22 VITALS — BP 173/122 | HR 74 | Wt 303.6 lb

## 2022-10-22 DIAGNOSIS — O165 Unspecified maternal hypertension, complicating the puerperium: Secondary | ICD-10-CM

## 2022-10-22 DIAGNOSIS — Z8759 Personal history of other complications of pregnancy, childbirth and the puerperium: Secondary | ICD-10-CM | POA: Diagnosis not present

## 2022-10-22 NOTE — Progress Notes (Signed)
Patient ID: Tina Melton                 DOB: 09-Nov-1985                      MRN: [id number]     HPI: Tina Melton is a 37 y.o. female referred by Dr. Servando Salina to HTN clinic. PMH is significant for HTN and obesity. Patient has been prescribed BP cuff but has not used yet.  Patient presents today to discuss BP. Feels well although is very tired. She reports not sleeping well due to baby who is now 22 months old. Her mother lives 15 minutes away and helps watch newborn but she does not drive. Reports only having 1 hour of sleep last night.  Has started exercising at [facility] but is discouraged she has not lost weight. Exercises 3 days a week in the morning. 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 Does not drink caffeine  Previously on nifedipine but was discontinued due to headaches. Placed on lisinopril which has been titrated to 20mg . No adverse effects reported.   Current HTN meds:  Lisonopril 20mg  daily  Previously tried: Nifedipine (headaches)  Wt Readings from Last 3 Encounters:  10/02/22 296 lb 14.4 oz (134.7 kg)  09/23/22 294 lb (133.4 kg)  08/28/22 293 lb (132.9 kg)   BP Readings from Last 3 Encounters:  10/02/22 (!) 160/102  09/23/22 (!) 174/121  08/28/22 (!) 152/85   Pulse Readings from Last 3 Encounters:  10/02/22 75  09/23/22 85  08/28/22 72    Renal function: CrCl cannot be calculated (Patient's most recent lab result is older than the maximum 21 days allowed.).  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   History of chicken pox    History of chlamydia    History of hematuria 09/18/2021   History of hemorrhage specific to perinatal period    6-7 mos bleeding x2, heavy bldg with 2nd preg was told baby nicked 2 blood vessels   History of pre-eclampsia in prior pregnancy,  currently pregnant 01/14/2022   Baseline labs  ASA   Obesity    Panic attacks    Pregnancy induced hypertension    Pregnancy with history of ectopic pregnancy, antepartum 12/11/2021   Proteinuria 04/17/2011   Yeast infection     Current Outpatient Medications on File Prior to Visit  Medication Sig Dispense Refill   Blood Pressure Monitoring (BLOOD PRESSURE MONITOR/L CUFF) MISC 1 Device by Does not apply route as directed. 1 each 0   levothyroxine (SYNTHROID) 25 MCG tablet Take 1 tablet (25 mcg total) by mouth daily before breakfast. (Patient not taking: Reported on 10/02/2022) 80 tablet 0   lisinopril (ZESTRIL) 20 MG tablet Take 1 tablet (20 mg total) by mouth daily. 30 tablet 0   prenatal vitamin w/FE, FA (PRENATAL 1 + 1) 27-1 MG TABS tablet Take 1 tablet by mouth daily at 12 noon. 30 tablet 11   No current facility-administered medications on file prior to visit.    Allergies  Allergen Reactions   Lemon Juice Anaphylaxis   Lemon Oil Anaphylaxis   Gemtesa [Vibegron] Rash     Assessment/Plan:  1. Hypertension -  Patient BP in room today 173/122 which is well above goal of <140/90. Unknown what home readings are. Possible  contributing factors are lack of sleep and [place] coat HTN. Educated patient on how to take BP at home and patient says she will try to be more compliant. Requested she bring cuff to follow up appt.   Will increase lisinopril to 30mg  at this time. Advised she can take one 20mg  tablet and one 10mg  tablet since she has some left over. Recheck in clinic in 2-3 weeks.  Increase lisinopril to 30mg  daily Check BP at home Recheck in clinic in 2-3 weeks  Laural Golden, PharmD, BCACP, CDCES, CPP [address] [place], [place], [address] Phone: [phone], Fax: [phone]

## 2022-10-22 NOTE — Patient Instructions (Addendum)
It was nice meeting you again  Your blood pressure was high today in the office  We can increase your lisinopril to 30mg  once a day in the morning. You can take one of your 20mg  tablets along with one of your 10mg  tablets  Please start checking your blood pressure at home after resting for about 5 minutes  Continue your exercise routine  I will see you back in a few weeks.  Please send me a message with any questions  Laural Golden, PharmD, BCACP, CDCES, CPP [address] [place], [place], [address] Phone: [phone], Fax: [phone]

## 2022-10-27 ENCOUNTER — Ambulatory Visit (HOSPITAL_COMMUNITY): Payer: Medicaid Other

## 2022-11-06 ENCOUNTER — Ambulatory Visit: Payer: Medicaid Other

## 2022-11-06 NOTE — Progress Notes (Deleted)
Patient ID: Tina Melton                 DOB: 03-19-1985                      MRN: [id number]     HPI: Tina Melton is a 37 y.o. female referred by Dr. Marland Kitchen to HTN clinic. PMH is significant for  Current HTN meds:  Previously tried:  BP goal:   Family History:   Social History:   Diet:   Exercise:   Home BP readings:   Wt Readings from Last 3 Encounters:  10/22/22 (!) 303 lb 9.6 oz (137.7 kg)  10/02/22 296 lb 14.4 oz (134.7 kg)  09/23/22 294 lb (133.4 kg)   BP Readings from Last 3 Encounters:  10/22/22 (!) 173/122  10/02/22 (!) 160/102  09/23/22 (!) 174/121   Pulse Readings from Last 3 Encounters:  10/22/22 74  10/02/22 75  09/23/22 85    Renal function: CrCl cannot be calculated (Patient's most recent lab result is older than the maximum 21 days allowed.).  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   History of chicken pox    History of chlamydia    History of hematuria 09/18/2021   History of hemorrhage specific to perinatal period    6-7 mos bleeding x2, heavy bldg with 2nd preg was told baby nicked 2 blood vessels   History of pre-eclampsia in prior pregnancy, currently pregnant 01/14/2022   Baseline labs  ASA   Obesity    Panic attacks    Pregnancy induced hypertension    Pregnancy with history of ectopic pregnancy, antepartum 12/11/2021   Proteinuria 04/17/2011   Yeast infection     Current Outpatient Medications on File Prior to Visit  Medication Sig Dispense Refill   CARESTART COVID-19 HOME TEST KIT See admin instructions.     Blood Pressure Monitoring (BLOOD PRESSURE MONITOR/L CUFF) MISC 1 Device by Does not apply route as directed. 1 each 0   levothyroxine (SYNTHROID) 25 MCG tablet Take 1 tablet (25 mcg total) by mouth daily before breakfast. (Patient not taking:  Reported on 10/02/2022) 80 tablet 0   lisinopril (ZESTRIL) 20 MG tablet Take 1 tablet (20 mg total) by mouth daily. 30 tablet 0   prenatal vitamin w/FE, FA (PRENATAL 1 + 1) 27-1 MG TABS tablet Take 1 tablet by mouth daily at 12 noon. (Patient not taking: Reported on 10/22/2022) 30 tablet 11   No current facility-administered medications on file prior to visit.    Allergies  Allergen Reactions   Lemon Juice Anaphylaxis   Lemon Oil Anaphylaxis   Gemtesa [Vibegron] Rash     Assessment/Plan:  1. Hypertension -

## 2022-11-13 ENCOUNTER — Other Ambulatory Visit (HOSPITAL_COMMUNITY): Payer: Self-pay

## 2022-11-13 ENCOUNTER — Encounter: Payer: Self-pay | Admitting: Pharmacist

## 2022-11-13 ENCOUNTER — Ambulatory Visit: Payer: Medicaid Other | Attending: Internal Medicine | Admitting: Pharmacist

## 2022-11-13 VITALS — BP 154/96 | HR 68

## 2022-11-13 DIAGNOSIS — I10 Essential (primary) hypertension: Secondary | ICD-10-CM | POA: Diagnosis not present

## 2022-11-13 DIAGNOSIS — O165 Unspecified maternal hypertension, complicating the puerperium: Secondary | ICD-10-CM

## 2022-11-13 DIAGNOSIS — Z8759 Personal history of other complications of pregnancy, childbirth and the puerperium: Secondary | ICD-10-CM

## 2022-11-13 MED ORDER — LISINOPRIL 40 MG PO TABS
40.0000 mg | ORAL_TABLET | Freq: Every day | ORAL | 1 refills | Status: DC
Start: 2022-11-13 — End: 2022-12-21
  Filled 2022-11-13: qty 90, 90d supply, fill #0

## 2022-11-13 NOTE — Progress Notes (Signed)
Patient ID: Tina Melton                 DOB: Oct 06, 1985                      MRN: [id number]     HPI: Tina Melton is a 37 y.o. female referred by Dr. Servando Salina to HTN clinic. PMH is significant for HTN and HTN in pregnancy.  Patinet presents today for HTN follow up. Brought baby girl (Tina Melton).   Continues to have struggles at home juggling motherhood, work, and other children. Had not been sleeping well until this week when she changed the angle of her bed. Her father has come over and is helping her as well which has been a relief for her. Mother does not drive and babies father now lives in [place].  Has been taken home readings sporadically and they are similar to her in office readings from last visit although she has not checked this week:  Recent readings:  182/106 70 175/106 78 181/105 76 178/115  Patient is currently breastfeeding.  Has been able to exercise 3x this week and is hoping to increase to 4x next week.  At last visit lisinopril was increased to 30mg  once daily. No patient reported adverse effects.  Requests a new referral to PCP.  Current HTN meds:  Lisinopril 30mg  aily  BP goal: <130/80  Wt Readings from Last 3 Encounters:  10/22/22 (!) 303 lb 9.6 oz (137.7 kg)  10/02/22 296 lb 14.4 oz (134.7 kg)  09/23/22 294 lb (133.4 kg)   BP Readings from Last 3 Encounters:  10/22/22 (!) 173/122  10/02/22 (!) 160/102  09/23/22 (!) 174/121   Pulse Readings from Last 3 Encounters:  10/22/22 74  10/02/22 75  09/23/22 85    Renal function: CrCl cannot be calculated (Patient's most recent lab result is older than the maximum 21 days allowed.).  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   History of chicken pox    History of chlamydia    History of hematuria 09/18/2021    History of hemorrhage specific to perinatal period    6-7 mos bleeding x2, heavy bldg with 2nd preg was told baby nicked 2 blood vessels   History of pre-eclampsia in prior pregnancy, currently pregnant 01/14/2022   Baseline labs  ASA   Obesity    Panic attacks    Pregnancy induced hypertension    Pregnancy with history of ectopic pregnancy, antepartum 12/11/2021   Proteinuria 04/17/2011   Yeast infection     Current Outpatient Medications on File Prior to Visit  Medication Sig Dispense Refill   Blood Pressure Monitoring (BLOOD PRESSURE MONITOR/L CUFF) MISC 1 Device by Does not apply route as directed. 1 each 0   CARESTART COVID-19 HOME TEST KIT See admin instructions.     levothyroxine (SYNTHROID) 25 MCG tablet Take 1 tablet (25 mcg total) by mouth daily before breakfast. (Patient not taking: Reported on 10/02/2022) 80 tablet 0   lisinopril (ZESTRIL) 20 MG tablet Take 1 tablet (20 mg total) by mouth daily. 30 tablet 0   prenatal vitamin w/FE, FA (PRENATAL 1 + 1) 27-1 MG TABS tablet Take 1 tablet by mouth daily at 12 noon. (Patient not taking: Reported on 10/22/2022) 30 tablet 11   No current facility-administered medications on  file prior to visit.    Allergies  Allergen Reactions   Lemon Juice Anaphylaxis   Lemon Oil Anaphylaxis   Gemtesa [Vibegron] Rash     Assessment/Plan:  1. Hypertension - Patient BP in room 154/96 which is improved since last visit but still above goal. (Could not recheck due to baby becoming fussy). Better sleep may be contributing to BP decrease and she reports feeling better.  Will increase lisinopril to 40mg  daily at this time and recheck BMP in 1-2 weeks. Has f/u already scheduled with Dr Servando Salina on 10/7.  Referral to primary care placed.  Follow up in 1 month.  Increase lisinopril to 40mg  daily Check BMP in 1-2 weeks F/u with Dr Servando Salina in 2 weeks F/u with PharmD in 4 weeks  Tina Melton, PharmD, BCACP, CDCES, CPP [address] [place], [place], [address] Phone: [phone], Fax: [phone]

## 2022-11-13 NOTE — Patient Instructions (Addendum)
It was good seeing you again  Your blood pressure is improved today but still higher than we like  I am going to increase your lisinopril to 40mg  once a day and I have also placed a lab order for you to have your labs checked in 1-2 weeks  I placed a referral for a primary care doctor  Please continue to monitor your blood pressure at home  Follow up with Dr Servando Salina in 2 weeks and myself in 4 weeks  Laural Golden, PharmD, BCACP, CDCES, CPP [address], [address] [place], [place], [address] Phone: [phone], Fax: [phone]

## 2022-11-16 ENCOUNTER — Other Ambulatory Visit (HOSPITAL_COMMUNITY): Payer: Self-pay

## 2022-11-19 ENCOUNTER — Ambulatory Visit (HOSPITAL_COMMUNITY): Payer: Medicaid Other | Attending: Cardiology

## 2022-11-19 DIAGNOSIS — R9431 Abnormal electrocardiogram [ECG] [EKG]: Secondary | ICD-10-CM | POA: Diagnosis not present

## 2022-11-19 LAB — ECHOCARDIOGRAM COMPLETE
Area-P 1/2: 5.13 cm2
S' Lateral: 3.7 cm

## 2022-11-23 ENCOUNTER — Encounter: Payer: Self-pay | Admitting: Cardiology

## 2022-11-23 ENCOUNTER — Ambulatory Visit: Payer: Medicaid Other | Attending: Cardiology | Admitting: Cardiology

## 2022-11-23 VITALS — BP 168/104 | HR 68 | Ht 71.0 in | Wt 308.6 lb

## 2022-11-23 DIAGNOSIS — Z8759 Personal history of other complications of pregnancy, childbirth and the puerperium: Secondary | ICD-10-CM

## 2022-11-23 DIAGNOSIS — O165 Unspecified maternal hypertension, complicating the puerperium: Secondary | ICD-10-CM

## 2022-11-23 DIAGNOSIS — I10 Essential (primary) hypertension: Secondary | ICD-10-CM

## 2022-11-23 MED ORDER — AMLODIPINE BESYLATE 5 MG PO TABS: 5.0000 mg | ORAL_TABLET | Freq: Every day | ORAL | 3 refills | Status: DC

## 2022-11-23 NOTE — Patient Instructions (Signed)
Medication Instructions:  Your physician has recommended you make the following change in your medication: START: Amlodipine 5 mg once daily *If you need a refill on your cardiac medications before your next appointment, please call your pharmacy*   Lab Work: None If you have labs (blood work) drawn today and your tests are completely normal, you will receive your results only by: MyChart Message (if you have MyChart) OR A paper copy in the mail If you have any lab test that is abnormal or we need to change your treatment, we will call you to review the results.   Testing/Procedures: None   Follow-Up: At [facility], you and your health needs are our priority.  As part of our continuing mission to provide you with exceptional heart care, we have created designated Provider Care Teams.  These Care Teams include your primary Cardiologist (physician) and Advanced Practice Providers (APPs -  Physician Assistants and Nurse Practitioners) who all work together to provide you with the care you need, when you need it.  Your next appointment:   12 week(s)  Provider:   Thomasene Ripple, DO     Other Instructions Please see our pharmacy staff Thayer Ohm P) in 2 weeks.

## 2022-11-24 NOTE — Progress Notes (Signed)
Cardio-Obstetrics Clinic  New Evaluation  Date:  11/24/2022   ID:  Tina Melton, DOB 01/06/1986, MRN [id number]  PCP:  Practice, Hiram Comber   [facility] Providers Cardiologist:  Thomasene Ripple, DO  Electrophysiologist:  None       Referring MD: Practice, Tora Duck*   Chief Complaint: " I am ok"  History of Present Illness:    Tina Melton is a 37 y.o. female [G9P3063] who is being seen today for the evaluation of postpartum hypertension at the request of Practice, Celesta Gentile Fami*.   Medical history includes hypertension, morbid obesity.  Her first visit with me was October 02, 2022 at that time she was started on lisinopril and had not been taking the medication.  During her visit her blood pressure was elevated I recommended adding additional antihypertensive she declined.  Since I saw her she had follow-up with our pharmacist twice.  On November 13 2022 during her visit her blood pressure was 150 96 she was an improvement since that she was seen.  Lisinopril was increased to 40 mg daily.  Today she has questions about exercising.   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   History of chicken pox    History of chlamydia    History of hematuria 09/18/2021   History of hemorrhage specific to perinatal period    6-7 mos bleeding x2, heavy bldg with 2nd preg was told baby nicked 2 blood vessels   History of pre-eclampsia in prior pregnancy, currently pregnant 01/14/2022   Baseline labs  ASA   Obesity    Panic attacks    Pregnancy induced hypertension    Pregnancy with history of ectopic pregnancy, antepartum 12/11/2021   Proteinuria 04/17/2011   Yeast infection     Past Surgical History:  Procedure  Laterality Date   INDUCED ABORTION     LAPAROSCOPIC UNILATERAL SALPINGECTOMY Left 08/11/2021   Procedure: LAPAROSCOPIC LEFT SALPINGECTOMY WITH REMOVAL OF ECTOPIC PREGNANCY;  Surgeon:  Bing, MD;  Location: [facility];  Service: Gynecology;  Laterality: Left;   LIPOMA EXCISION Right 10/21/2021   Procedure: EXCISION LIPOMA;  Surgeon: Henrene Dodge, MD;  Location: [facility] ORS;  Service: General;  Laterality: Right;      OB History     Gravida  9   Para  3   Term  3   Preterm  0   AB  6   Living  3      SAB  1   IAB  4   Ectopic  1   Multiple  0   Live Births  3               Current Medications: Current Meds  Medication Sig   amLODipine (NORVASC) 5 MG tablet Take 1 tablet (5 mg total) by mouth daily.   lisinopril (ZESTRIL) 40 MG tablet Take 1 tablet (40 mg total) by mouth daily.     Allergies:   Lemon juice, Lemon oil, and Gemtesa [vibegron]   Social History   Socioeconomic History   Marital status: Single    Spouse name: Not on file   Number of children: 2   Years of education: Not on file   Highest education level: Not on file  Occupational History  Occupation: mental health therapist  Tobacco Use   Smoking status: Never   Smokeless tobacco: Never  Vaping Use   Vaping status: Never Used  Substance and Sexual Activity   Alcohol use: No   Drug use: Not Currently    Comment: marijuana years ago   Sexual activity: Yes    Birth control/protection: None  Other Topics Concern   Not on file  Social History Narrative   Not on file   Social Determinants of Health   Financial Resource Strain: Not on file  Food Insecurity: No Food Insecurity (07/15/2022)   Hunger Vital Sign    Worried About Running Out of Food in the Last Year: Never true    Ran Out of Food in the Last Year: Never true  Transportation Needs: No Transportation Needs (07/15/2022)   PRAPARE - Administrator, Civil Service (Medical): No    Lack of Transportation  (Non-Medical): No  Physical Activity: Not on file  Stress: Not on file  Social Connections: Unknown (06/27/2021)   Received from [facility]   Social Network    Social Network: Not on file      Family History  Problem Relation Age of Onset   Mental illness Mother        schizophrenia and panic attacks   Rheum arthritis Father    Arthritis Father    Birth defects Son        extra digit   Hypertension Maternal Grandmother    Stroke Maternal Grandmother    Dementia Maternal Grandmother    Diabetes Maternal Grandfather    Rheum arthritis Paternal Grandmother    Arthritis Paternal Grandmother    Diabetes Paternal Grandmother    Prostate cancer Neg Hx    Bladder Cancer Neg Hx    Kidney cancer Neg Hx    Asthma Neg Hx    Heart disease Neg Hx       ROS:   Please see the history of present illness.     All other systems reviewed and are negative.   Labs/EKG Reviewed:    EKG:   EKG was not  ordered today.    Recent Labs: 06/11/2022: TSH 0.701 07/16/2022: Magnesium 3.9 07/29/2022: ALT 17; BUN 13; Creatinine, Ser 0.78; Hemoglobin 10.7; Platelets 275; Potassium 3.8; Sodium 141   Recent Lipid Panel No results found for: "CHOL", "TRIG", "HDL", "CHOLHDL", "LDLCALC", "LDLDIRECT"  Physical Exam:    VS:  BP (!) 168/104 (BP Location: Left Arm, Patient Position: Sitting, Cuff Size: Large)   Pulse 68   Ht 5\' 11"  (1.803 m)   Wt (!) 308 lb 9.6 oz (140 kg)   SpO2 98%   BMI 43.04 kg/m     Wt Readings from Last 3 Encounters:  11/23/22 (!) 308 lb 9.6 oz (140 kg)  10/22/22 (!) 303 lb 9.6 oz (137.7 kg)  10/02/22 296 lb 14.4 oz (134.7 kg)     GEN:  Well nourished, well developed in no acute distress HEENT: Normal NECK: No JVD; No carotid bruits LYMPHATICS: No lymphadenopathy CARDIAC: RRR, no murmurs, rubs, gallops RESPIRATORY:  Clear to auscultation without rales, wheezing or rhonchi  ABDOMEN: Soft, non-tender, non-distended MUSCULOSKELETAL:  No edema; No  deformity  SKIN: Warm and dry NEUROLOGIC:  Alert and oriented x 3 PSYCHIATRIC:  Normal affect    Risk Assessment/Risk Calculators:                 ASSESSMENT & PLAN:    Accelerated postpartum hypertension Morbid  obesity   Her blood pressure is significantly elevated in the office repeat blood pressure which was also done by me 168/104.  I have advised the patient I that she will need additional antihypertensive medication ideally she would likely end up on 3 antihypertensive medication given her high blood pressure.  She tells me she is not a medication person.  Explained to the patient that her blood pressure being elevated on lisinopril 40 mg daily really would benefit from additional antihypertensive medication.  She is agreeable I am going to start amlodipine 5 mg daily.  Ideally I like to transition her off of lisinopril to an ARB and then if needed add thiazide diuretic.  Once again understand is if she really is going to be adherence.  She does not share with me any barriers that will lead to nonadherence of her medications.  Her only concern is that she does not want to be on a long-term medication.  Explained to the patient that this will be beneficial to avoid progression for strokes or heart attacks or kidney failure from long-term uncontrolled hypertension. She expresses understanding.  She needs to follow-up with our pharmacy team in 2 weeks have also asked the patient to send me updated blood pressure in 1 week.  The patient understands the need to lose weight with diet and exercise. We have discussed specific strategies for this.  Patient Instructions  Medication Instructions:  Your physician has recommended you make the following change in your medication: START: Amlodipine 5 mg once daily *If you need a refill on your cardiac medications before your next appointment, please call your pharmacy*   Lab Work: None If you have labs (blood work) drawn today and your  tests are completely normal, you will receive your results only by: MyChart Message (if you have MyChart) OR A paper copy in the mail If you have any lab test that is abnormal or we need to change your treatment, we will call you to review the results.   Testing/Procedures: None   Follow-Up: At [facility], you and your health needs are our priority.  As part of our continuing mission to provide you with exceptional heart care, we have created designated Provider Care Teams.  These Care Teams include your primary Cardiologist (physician) and Advanced Practice Providers (APPs -  Physician Assistants and Nurse Practitioners) who all work together to provide you with the care you need, when you need it.  Your next appointment:   12 week(s)  Provider:   Thomasene Ripple, DO     Other Instructions Please see our pharmacy staff Thayer Ohm P) in 2 weeks.     Dispo:  No follow-ups on file.   Medication Adjustments/Labs and Tests Ordered: Current medicines are reviewed at length with the patient today.  Concerns regarding medicines are outlined above.  Tests Ordered: No orders of the defined types were placed in this encounter.  Medication Changes: Meds ordered this encounter  Medications   amLODipine (NORVASC) 5 MG tablet    Sig: Take 1 tablet (5 mg total) by mouth daily.    Dispense:  180 tablet    Refill:  3

## 2022-12-02 ENCOUNTER — Ambulatory Visit: Payer: Medicaid Other | Admitting: Primary Care

## 2022-12-11 ENCOUNTER — Ambulatory Visit: Payer: Medicaid Other

## 2022-12-21 ENCOUNTER — Other Ambulatory Visit: Payer: Self-pay | Admitting: *Deleted

## 2022-12-21 ENCOUNTER — Other Ambulatory Visit: Payer: Self-pay

## 2022-12-21 DIAGNOSIS — I10 Essential (primary) hypertension: Secondary | ICD-10-CM

## 2022-12-21 DIAGNOSIS — O165 Unspecified maternal hypertension, complicating the puerperium: Secondary | ICD-10-CM

## 2022-12-21 MED ORDER — LISINOPRIL 40 MG PO TABS
40.0000 mg | ORAL_TABLET | Freq: Every day | ORAL | 3 refills | Status: DC
Start: 2022-12-21 — End: 2023-02-24

## 2022-12-21 NOTE — Telephone Encounter (Signed)
Fax received from [facility] for refill request lisinopril from Dr Shawnie Pons, pt now under the care of Dr Servando Salina, will fax to her office

## 2022-12-24 ENCOUNTER — Ambulatory Visit: Payer: Medicaid Other | Admitting: Physician Assistant

## 2022-12-25 ENCOUNTER — Ambulatory Visit: Payer: Medicaid Other | Attending: Cardiovascular Disease | Admitting: Pharmacist

## 2022-12-25 ENCOUNTER — Encounter: Payer: Self-pay | Admitting: Physician Assistant

## 2022-12-25 ENCOUNTER — Encounter: Payer: Self-pay | Admitting: Pharmacist

## 2022-12-25 VITALS — BP 155/85 | HR 70

## 2022-12-25 DIAGNOSIS — I10 Essential (primary) hypertension: Secondary | ICD-10-CM

## 2022-12-25 MED ORDER — AMLODIPINE BESYLATE 10 MG PO TABS
10.0000 mg | ORAL_TABLET | Freq: Every day | ORAL | 1 refills | Status: DC
Start: 2022-12-25 — End: 2023-06-29

## 2022-12-25 NOTE — Progress Notes (Unsigned)
Patient ID: Tina Melton                 DOB: Dec 17, 1985                      MRN: [id number]     HPI: Tina Melton is a 37 y.o. female referred by Dr. Servando Salina. PMH is significant for HTN, obesity, and gestational HTN.  Patient presents for follow up. Blood pressures have been consistently elevated since giving birth in late May. Options have been limited due to breastfeeding.  Has been working on lifestyle changes. Exercises weekly at [facility] with a trainer.   Reports no adverse effects to amlodipine. However has not been checking BP at home.  Entered referral for PCP at last visit however patient says she has not received a call.  Plans on continuing to breastfeed as daughter did not care for formula.  Current HTN meds:  Amlodipine 5mg  daily Lisinpril 40mg  daily  BP goal: <130/80  Wt Readings from Last 3 Encounters:  11/23/22 (!) 308 lb 9.6 oz (140 kg)  10/22/22 (!) 303 lb 9.6 oz (137.7 kg)  10/02/22 296 lb 14.4 oz (134.7 kg)   BP Readings from Last 3 Encounters:  11/23/22 (!) 168/104  11/13/22 (!) 154/96  10/22/22 (!) 173/122   Pulse Readings from Last 3 Encounters:  11/23/22 68  11/13/22 68  10/22/22 74    Renal function: CrCl cannot be calculated (Patient's most recent lab result is older than the maximum 21 days allowed.).  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   History of chicken pox    History of chlamydia    History of hematuria 09/18/2021   History of hemorrhage specific to perinatal period    6-7 mos bleeding x2, heavy bldg with 2nd preg was told baby nicked 2 blood vessels   History of pre-eclampsia in prior pregnancy, currently pregnant 01/14/2022   Baseline labs  ASA   Obesity    Panic attacks    Pregnancy induced hypertension    Pregnancy with history of ectopic  pregnancy, antepartum 12/11/2021   Proteinuria 04/17/2011   Yeast infection     Current Outpatient Medications on File Prior to Visit  Medication Sig Dispense Refill   amLODipine (NORVASC) 5 MG tablet Take 1 tablet (5 mg total) by mouth daily. 180 tablet 3   levothyroxine (SYNTHROID) 25 MCG tablet Take 1 tablet (25 mcg total) by mouth daily before breakfast. 80 tablet 0   lisinopril (ZESTRIL) 40 MG tablet Take 1 tablet (40 mg total) by mouth daily. 90 tablet 3   No current facility-administered medications on file prior to visit.    Allergies  Allergen Reactions   Lemon Juice Anaphylaxis   Lemon Oil Anaphylaxis   Gemtesa [Vibegron] Rash     Assessment/Plan:  1. Hypertension -  Patient BP remains above goal today at 155/85 but improved from previous. Unfortunately do not have home readings to refer to. Limited in medication options. Will increase amlodipine to 10mg  daily at this time and will speak with front desk about referral which I see as active. Follow up in 4 weeks.  Continue lisinopril 40mg  daily Increase amlodipine to 10mg  daily F/U in 4 weeks  Laural Golden, PharmD, BCACP, CDCES, CPP [address] [place], [place], [address] Phone: [phone],  Fax: [phone]

## 2022-12-25 NOTE — Patient Instructions (Signed)
It was good seeing you again  Please increase your amlodipine to 10mg  daily. You can take two of your 5mg  tablets until you run out  Continue your lisinopril 40mg  daily  I will see you back in 4 weeks  Laural Golden, PharmD, BCACP, CDCES, CPP [address], [address] [place], [place], [address] Phone: [phone], Fax: [phone]

## 2023-01-27 ENCOUNTER — Encounter: Payer: Self-pay | Admitting: Pharmacist

## 2023-01-29 ENCOUNTER — Ambulatory Visit: Payer: Medicaid Other

## 2023-01-29 NOTE — Progress Notes (Unsigned)
Patient ID: Tina Melton                 DOB: Jun 07, 1985                      MRN: [id number]     HPI: Tina Melton is a 37 y.o. female referred by Dr. Servando Salina. PMH is significant for HTN, obesity, and gestational HTN.  Patient presents for follow up. Blood pressures have been consistently elevated since giving birth in late May. Options have been limited due to breastfeeding.  Has been working on lifestyle changes. Exercises weekly at [facility] with a trainer.   Reports no adverse effects to amlodipine. However has not been checking BP at home.  Entered referral for PCP at last visit however patient says she has not received a call.  Plans on continuing to breastfeed as daughter did not care for formula.  Current HTN meds:  Amlodipine 5mg  daily Lisinpril 40mg  daily  BP goal: <130/80  Wt Readings from Last 3 Encounters:  11/23/22 (!) 308 lb 9.6 oz (140 kg)  10/22/22 (!) 303 lb 9.6 oz (137.7 kg)  10/02/22 296 lb 14.4 oz (134.7 kg)   BP Readings from Last 3 Encounters:  12/25/22 (!) 155/85  11/23/22 (!) 168/104  11/13/22 (!) 154/96   Pulse Readings from Last 3 Encounters:  12/25/22 70  11/23/22 68  11/13/22 68    Renal function: CrCl cannot be calculated (Patient's most recent lab result is older than the maximum 21 days allowed.).  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   History of chicken pox    History of chlamydia    History of hematuria 09/18/2021   History of hemorrhage specific to perinatal period    6-7 mos bleeding x2, heavy bldg with 2nd preg was told baby nicked 2 blood vessels   History of pre-eclampsia in prior pregnancy, currently pregnant 01/14/2022   Baseline labs  ASA   Obesity    Panic attacks    Pregnancy induced hypertension    Pregnancy with history of ectopic  pregnancy, antepartum 12/11/2021   Proteinuria 04/17/2011   Yeast infection     Current Outpatient Medications on File Prior to Visit  Medication Sig Dispense Refill   amLODipine (NORVASC) 10 MG tablet Take 1 tablet (10 mg total) by mouth daily. 90 tablet 1   levothyroxine (SYNTHROID) 25 MCG tablet Take 1 tablet (25 mcg total) by mouth daily before breakfast. 80 tablet 0   lisinopril (ZESTRIL) 40 MG tablet Take 1 tablet (40 mg total) by mouth daily. 90 tablet 3   No current facility-administered medications on file prior to visit.    Allergies  Allergen Reactions   Lemon Juice Anaphylaxis   Lemon Oil Anaphylaxis   Gemtesa [Vibegron] Rash     Assessment/Plan:  1. Hypertension -  Patient BP remains above goal today at 155/85 but improved from previous. Unfortunately do not have home readings to refer to. Limited in medication options. Will increase amlodipine to 10mg  daily at this time and will speak with front desk about referral which I see as active. Follow up in 4 weeks.  Continue lisinopril 40mg  daily Increase amlodipine to 10mg  daily F/U in 4 weeks  Laural Golden, PharmD, BCACP, CDCES, CPP [address], [address] [place], [place], [address] Phone: [phone],  Fax: [phone]

## 2023-02-23 ENCOUNTER — Ambulatory Visit: Payer: Medicaid Other | Admitting: Cardiology

## 2023-02-24 ENCOUNTER — Encounter: Payer: Self-pay | Admitting: Cardiology

## 2023-02-24 ENCOUNTER — Ambulatory Visit: Payer: Medicaid Other | Attending: Cardiology | Admitting: Cardiology

## 2023-02-24 VITALS — BP 142/85 | HR 80 | Ht 71.0 in | Wt 304.6 lb

## 2023-02-24 DIAGNOSIS — O09299 Supervision of pregnancy with other poor reproductive or obstetric history, unspecified trimester: Secondary | ICD-10-CM

## 2023-02-24 DIAGNOSIS — Z8759 Personal history of other complications of pregnancy, childbirth and the puerperium: Secondary | ICD-10-CM | POA: Diagnosis not present

## 2023-02-24 DIAGNOSIS — I10 Essential (primary) hypertension: Secondary | ICD-10-CM

## 2023-02-24 MED ORDER — LABETALOL HCL 100 MG PO TABS: 100.0000 mg | ORAL_TABLET | Freq: Two times a day (BID) | ORAL | 3 refills | Status: DC

## 2023-02-24 NOTE — Progress Notes (Signed)
 Cardio-Obstetrics Clinic  New Evaluation  Date:  02/24/2023   ID:  Tina Melton, DOB 01/11/1986, MRN [id number]  PCP:  Practice, Rosslyn Bays   [facility] Providers Cardiologist:  Dub Huntsman, DO  Electrophysiologist:  None       Referring MD: Practice, Rosslyn Herrlich*   Chief Complaint:  I am ok  History of Present Illness:    Tina Melton is a 38 y.o. female [G9P3063] who is being seen today for the evaluation of postpartum hypertension at the request of Practice, Rosslyn Fami*.   Medical history includes hypertension, morbid obesity.  At her last visit with me and continue her lisinopril  and added amlodipine  5 mg and the patient daily regiment.  She has follow-up with Chris Pavero, PharmD since her last visit and medication has been titrated.  She is happy that her blood pressure is improving.  She is actually thinking about going full swelling on her exercise routine and is also considering starting on a strict diet but is hesitant due to the fact that she is currently breast-feeding.      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   History of chicken pox    History of chlamydia    History of hematuria 09/18/2021   History of hemorrhage specific to perinatal period    6-7 mos bleeding x2, heavy bldg with 2nd preg was told baby nicked 2 blood vessels   History of pre-eclampsia in prior pregnancy, currently pregnant 01/14/2022   Baseline labs  ASA   Obesity    Panic attacks    Pregnancy induced hypertension    Pregnancy with history of ectopic pregnancy, antepartum 12/11/2021   Proteinuria 04/17/2011   Yeast infection     Past Surgical History:  Procedure Laterality Date   INDUCED ABORTION      LAPAROSCOPIC UNILATERAL SALPINGECTOMY Left 08/11/2021   Procedure: LAPAROSCOPIC LEFT SALPINGECTOMY WITH REMOVAL OF ECTOPIC PREGNANCY;  Surgeon: Izell Harari, MD;  Location: [facility] OR;  Service: Gynecology;  Laterality: Left;   LIPOMA EXCISION Right 10/21/2021   Procedure: EXCISION LIPOMA;  Surgeon: Desiderio Schanz, MD;  Location: [facility] ORS;  Service: General;  Laterality: Right;      OB History     Gravida  9   Para  3   Term  3   Preterm  0   AB  6   Living  3      SAB  1   IAB  4   Ectopic  1   Multiple  0   Live Births  3               Current Medications: Current Meds  Medication Sig   amLODipine  (NORVASC ) 10 MG tablet Take 1 tablet (10 mg total) by mouth daily.   labetalol  (NORMODYNE ) 100 MG tablet Take 1 tablet (100 mg total) by mouth 2 (two) times daily.   [DISCONTINUED] lisinopril  (ZESTRIL ) 40 MG tablet Take 1 tablet (40 mg total) by mouth daily.     Allergies:   Lemon juice, Lemon oil, and Gemtesa  [vibegron ]   Social History   Socioeconomic History   Marital status: Single    Spouse name: Not on file   Number of children: 2   Years of education: Not on  file   Highest education level: Not on file  Occupational History   Occupation: mental health therapist  Tobacco Use   Smoking status: Never   Smokeless tobacco: Never  Vaping Use   Vaping status: Never Used  Substance and Sexual Activity   Alcohol use: No   Drug use: Not Currently    Comment: marijuana years ago   Sexual activity: Yes    Birth control/protection: None  Other Topics Concern   Not on file  Social History Narrative   Not on file   Social Drivers of Health   Financial Resource Strain: Not on file  Food Insecurity: No Food Insecurity (07/15/2022)   Hunger Vital Sign    Worried About Running Out of Food in the Last Year: Never true    Ran Out of Food in the Last Year: Never true  Transportation Needs: No Transportation Needs (07/15/2022)   PRAPARE - Doctor, General Practice (Medical): No    Lack of Transportation (Non-Medical): No  Physical Activity: Not on file  Stress: Not on file  Social Connections: Unknown (06/27/2021)   Received from [facility]   Social Network    Social Network: Not on file      Family History  Problem Relation Age of Onset   Mental illness Mother        schizophrenia and panic attacks   Rheum arthritis Father    Arthritis Father    Birth defects Son        extra digit   Hypertension Maternal Grandmother    Stroke Maternal Grandmother    Dementia Maternal Grandmother    Diabetes Maternal Grandfather    Rheum arthritis Paternal Grandmother    Arthritis Paternal Grandmother    Diabetes Paternal Grandmother    Prostate cancer Neg Hx    Bladder Cancer Neg Hx    Kidney cancer Neg Hx    Asthma Neg Hx    Heart disease Neg Hx       ROS:   Please see the history of present illness.     All other systems reviewed and are negative.   Labs/EKG Reviewed:    EKG:   EKG was not  ordered today.    Recent Labs: 06/11/2022: TSH 0.701 07/16/2022: Magnesium  3.9 07/29/2022: ALT 17; BUN 13; Creatinine, Ser 0.78; Hemoglobin 10.7; Platelets 275; Potassium 3.8; Sodium 141   Recent Lipid Panel No results found for: CHOL, TRIG, HDL, CHOLHDL, LDLCALC, LDLDIRECT  Physical Exam:    VS:  BP (!) 142/85 (BP Location: Right Arm, Patient Position: Sitting, Cuff Size: Large)   Pulse 80   Ht 5' 11 (1.803 m)   Wt (!) 304 lb 9.6 oz (138.2 kg)   SpO2 100%   BMI 42.48 kg/m     Wt Readings from Last 3 Encounters:  02/24/23 (!) 304 lb 9.6 oz (138.2 kg)  11/23/22 (!) 308 lb 9.6 oz (140 kg)  10/22/22 (!) 303 lb 9.6 oz (137.7 kg)     GEN:  Well nourished, well developed in no acute distress HEENT: Normal NECK: No JVD; No carotid bruits LYMPHATICS: No lymphadenopathy CARDIAC: RRR, no murmurs, rubs, gallops RESPIRATORY:  Clear to auscultation without rales, wheezing or rhonchi  ABDOMEN: Soft,  non-tender, non-distended MUSCULOSKELETAL:  No edema; No deformity  SKIN: Warm and dry NEUROLOGIC:  Alert and oriented x 3 PSYCHIATRIC:  Normal affect    Risk Assessment/Risk Calculators:  ASSESSMENT & PLAN:    Accelerated postpartum hypertension-improving Morbid obesity  Blood pressure has improved but still not yet less than 130/80.  Continue amlodipine  10 mg daily.  Will hold off on adding diuretics given the fact that she is also having trouble with milk production and I do not want to make this issue worse.  Will stop lisinopril , will start the patient on labetalol  100 mg twice daily.  She already have follow-up plans with the Pharm.D. clinic.  The patient understands the need to lose weight with diet and exercise. We have discussed specific strategies for this.  Patient Instructions  Medication Instructions:  Your physician has recommended you make the following change in your medication:  STOP:  Lisinopril  START: Labetalol  100 mg twice daily *If you need a refill on your cardiac medications before your next appointment, please call your pharmacy*  Follow-Up: At [facility], you and your health needs are our priority.  As part of our continuing mission to provide you with exceptional heart care, we have created designated Provider Care Teams.  These Care Teams include your primary Cardiologist (physician) and Advanced Practice Providers (APPs -  Physician Assistants and Nurse Practitioners) who all work together to provide you with the care you need, when you need it.  Your next appointment:   12 week(s)  Provider:   Laine Fonner, DO     Other Instructions You are invited to attend: [facility] event on February Friday 7th 2025 at [facility] ([address] [place], [place], [place] [address]) from 8am-12pm. Feel free to invite other women to attend!  See you there!          Dispo:  No follow-ups on file.   Medication  Adjustments/Labs and Tests Ordered: Current medicines are reviewed at length with the patient today.  Concerns regarding medicines are outlined above.  Tests Ordered: No orders of the defined types were placed in this encounter.  Medication Changes: Meds ordered this encounter  Medications   labetalol  (NORMODYNE ) 100 MG tablet    Sig: Take 1 tablet (100 mg total) by mouth 2 (two) times daily.    Dispense:  180 tablet    Refill:  3

## 2023-02-24 NOTE — Patient Instructions (Signed)
 Medication Instructions:  Your physician has recommended you make the following change in your medication:  STOP:  Lisinopril  START: Labetalol  100 mg twice daily *If you need a refill on your cardiac medications before your next appointment, please call your pharmacy*  Follow-Up: At [facility], you and your health needs are our priority.  As part of our continuing mission to provide you with exceptional heart care, we have created designated Provider Care Teams.  These Care Teams include your primary Cardiologist (physician) and Advanced Practice Providers (APPs -  Physician Assistants and Nurse Practitioners) who all work together to provide you with the care you need, when you need it.  Your next appointment:   12 week(s)  Provider:   Kardie Tobb, DO     Other Instructions You are invited to attend: [facility] event on February Friday 7th 2025 at [facility] ([address] [place], [place], [place] [address]) from 8am-12pm. Feel free to invite other women to attend!  See you there!

## 2023-03-15 ENCOUNTER — Ambulatory Visit: Payer: Medicaid Other | Attending: Cardiology | Admitting: Pharmacist Clinician (PhC)/ Clinical Pharmacy Specialist

## 2023-03-15 ENCOUNTER — Encounter: Payer: Self-pay | Admitting: Pharmacist Clinician (PhC)/ Clinical Pharmacy Specialist

## 2023-03-15 DIAGNOSIS — I10 Essential (primary) hypertension: Secondary | ICD-10-CM | POA: Diagnosis not present

## 2023-03-15 DIAGNOSIS — Z111 Encounter for screening for respiratory tuberculosis: Secondary | ICD-10-CM | POA: Diagnosis not present

## 2023-03-15 NOTE — Assessment & Plan Note (Addendum)
Assessment: BP is uncontrolled in office BP 138/85 mmHg;  above the goal (<130/80). Taking labetalol only once daiy Tolerates labetalol well, without any side effects; some fatigue with amlodipine Denies SOB, palpitation, chest pain, headaches,or swelling Reiterated the importance of regular exercise and low salt diet  Still nursing, daughter currently 7 months  Plan:  Start taking labetalol 100 mg twice daily Continue taking amlodipine 5 mg, but move to evenings Patient to keep record of BP readings with heart rate and report to [place] at the next visit Patient to follow up with Laural Golden in 6 weeks  Labs ordered today:  none

## 2023-03-15 NOTE — Patient Instructions (Signed)
Follow up appointment: March 14 at 10:30 am with Laural Golden  Take your BP meds as follows:   Increase labetalol to 100 mg twice daily  Move amlodipine to evenings.   Check your blood pressure at home daily (if able) and keep record of the readings.  Your blood pressure goal is < 130/80  To check your pressure at home you will need to:  1. Sit up in a chair, with feet flat on the floor and back supported. Do not cross your ankles or legs. 2. Rest your left arm so that the cuff is about heart level. If the cuff goes on your upper arm,  then just relax the arm on the table, arm of the chair or your lap. If you have a wrist cuff, we  suggest relaxing your wrist against your chest (think of it as Pledging the Flag with the  wrong arm).  3. Place the cuff snugly around your arm, about 1 inch above the crook of your elbow. The  cords should be inside the groove of your elbow.  4. Sit quietly, with the cuff in place, for about 5 minutes. After that 5 minutes press the power  button to start a reading. 5. Do not talk or move while the reading is taking place.  6. Record your readings on a sheet of paper. Although most cuffs have a memory, it is often  easier to see a pattern developing when the numbers are all in front of you.  7. You can repeat the reading after 1-3 minutes if it is recommended  Make sure your bladder is empty and you have not had caffeine or tobacco within the last 30 min  Always bring your blood pressure log with you to your appointments. If you have not brought your monitor in to be double checked for accuracy, please bring it to your next appointment.  You can find a list of quality blood pressure cuffs at [email or web address]  Important lifestyle changes to control high blood pressure  Intervention  Effect on the BP  Lose extra pounds and watch your waistline Weight loss is one of the most effective lifestyle changes for controlling blood pressure. If you're overweight  or obese, losing even a small amount of weight can help reduce blood pressure. Blood pressure might go down by about 1 millimeter of mercury (mm Hg) with each kilogram (about 2.2 pounds) of weight lost.  Exercise regularly As a general goal, aim for at least 30 minutes of moderate physical activity every day. Regular physical activity can lower high blood pressure by about 5 to 8 mm Hg.  Eat a healthy diet Eating a diet rich in whole grains, fruits, vegetables, and low-fat dairy products and low in saturated fat and cholesterol. A healthy diet can lower high blood pressure by up to 11 mm Hg.  Reduce salt (sodium) in your diet Even a small reduction of sodium in the diet can improve heart health and reduce high blood pressure by about 5 to 6 mm Hg.  Limit alcohol One drink equals 12 ounces of beer, 5 ounces of wine, or 1.5 ounces of 80-proof liquor.  Limiting alcohol to less than one drink a day for women or two drinks a day for men can help lower blood pressure by about 4 mm Hg.   If you have any questions or concerns please use My Chart to send questions or call the office at [phone]
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
## 2023-03-18 ENCOUNTER — Encounter: Payer: Self-pay | Admitting: Cardiology

## 2023-03-18 ENCOUNTER — Encounter: Payer: Self-pay | Admitting: Pharmacist

## 2023-03-19 ENCOUNTER — Ambulatory Visit (INDEPENDENT_AMBULATORY_CARE_PROVIDER_SITE_OTHER): Payer: Medicaid Other | Admitting: Pharmacist

## 2023-03-19 VITALS — BP 154/100 | HR 55 | Wt 307.8 lb

## 2023-03-19 DIAGNOSIS — I10 Essential (primary) hypertension: Secondary | ICD-10-CM | POA: Diagnosis not present

## 2023-03-19 NOTE — Patient Instructions (Addendum)
Your blood pressure today is elevated  Continue 10mg  of amlodipine. You can take two of your 5mg  tablets  Take 1 tablet of labetalol tomorrow and Sunday  We will restart lisinopril starting Monday 2/3 restart your lisinopril 20mg  (1/2 of a 40mg  tablet) and continue to monitor your blood pressure  After one week if everything is going well we can increase to 40mg  (the entire tablet)  Laural Golden, PharmD, BCACP, CDCES, CPP [address], [address] [place], [place], [address] Phone: [phone], Fax: [phone]
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
## 2023-03-21 ENCOUNTER — Encounter: Payer: Self-pay | Admitting: Pharmacist

## 2023-03-30 NOTE — Telephone Encounter (Signed)
Pt was seen on 1/31.

## 2023-04-24 ENCOUNTER — Other Ambulatory Visit: Payer: Self-pay

## 2023-04-24 ENCOUNTER — Emergency Department (HOSPITAL_COMMUNITY)

## 2023-04-24 ENCOUNTER — Encounter (HOSPITAL_COMMUNITY): Payer: Self-pay | Admitting: *Deleted

## 2023-04-24 ENCOUNTER — Emergency Department (HOSPITAL_COMMUNITY)
Admission: EM | Admit: 2023-04-24 | Discharge: 2023-04-24 | Disposition: A | Discharge status: Home / Self Care | Attending: Emergency Medicine | Admitting: Emergency Medicine

## 2023-04-24 DIAGNOSIS — T22122A Burn of first degree of left elbow, initial encounter: Secondary | ICD-10-CM | POA: Diagnosis not present

## 2023-04-24 DIAGNOSIS — S90511A Abrasion, right ankle, initial encounter: Secondary | ICD-10-CM | POA: Diagnosis not present

## 2023-04-24 DIAGNOSIS — S0093XA Contusion of unspecified part of head, initial encounter: Secondary | ICD-10-CM | POA: Diagnosis not present

## 2023-04-24 DIAGNOSIS — Z043 Encounter for examination and observation following other accident: Secondary | ICD-10-CM | POA: Diagnosis not present

## 2023-04-24 DIAGNOSIS — T300 Burn of unspecified body region, unspecified degree: Secondary | ICD-10-CM

## 2023-04-24 DIAGNOSIS — X088XXA Exposure to other specified smoke, fire and flames, initial encounter: Secondary | ICD-10-CM | POA: Diagnosis not present

## 2023-04-24 DIAGNOSIS — T25211A Burn of second degree of right ankle, initial encounter: Secondary | ICD-10-CM | POA: Diagnosis not present

## 2023-04-24 DIAGNOSIS — R0789 Other chest pain: Secondary | ICD-10-CM | POA: Diagnosis not present

## 2023-04-24 DIAGNOSIS — T310 Burns involving less than 10% of body surface: Secondary | ICD-10-CM | POA: Diagnosis not present

## 2023-04-24 DIAGNOSIS — S0990XA Unspecified injury of head, initial encounter: Secondary | ICD-10-CM | POA: Diagnosis not present

## 2023-04-24 LAB — HCG, SERUM, QUALITATIVE: Preg, Serum: NEGATIVE
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
## 2023-04-30 ENCOUNTER — Encounter: Payer: Self-pay | Admitting: Pharmacist

## 2023-04-30 ENCOUNTER — Telehealth (HOSPITAL_BASED_OUTPATIENT_CLINIC_OR_DEPARTMENT_OTHER): Payer: Self-pay | Admitting: General Practice

## 2023-04-30 ENCOUNTER — Ambulatory Visit: Payer: Medicaid Other | Attending: Cardiovascular Disease | Admitting: Pharmacist

## 2023-04-30 VITALS — BP 137/86 | HR 68

## 2023-04-30 DIAGNOSIS — O09299 Supervision of pregnancy with other poor reproductive or obstetric history, unspecified trimester: Secondary | ICD-10-CM | POA: Diagnosis not present

## 2023-04-30 DIAGNOSIS — I10 Essential (primary) hypertension: Secondary | ICD-10-CM
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
## 2023-04-30 NOTE — Telephone Encounter (Signed)
 Please call patient to schedule an appt for PCP establish care. She has been unable to be reached per the referral. Please schedule asap
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
## 2023-05-01 LAB — BASIC METABOLIC PANEL
BUN/Creatinine Ratio: 24 — ABNORMAL HIGH (ref 9–23)
BUN: 20 mg/dL (ref 6–20)
CO2: 23 mmol/L (ref 20–29)
Calcium: 9.4 mg/dL (ref 8.7–10.2)
Chloride: 103 mmol/L (ref 96–106)
Creatinine, Ser: 0.83 mg/dL (ref 0.57–1.00)
Glucose: 77 mg/dL (ref 70–99)
Potassium: 4.3 mmol/L (ref 3.5–5.2)
Sodium: 139 mmol/L (ref 134–144)
eGFR: 92 mL/min/{1.73_m2} (ref >59)

## 2023-05-03 ENCOUNTER — Encounter: Payer: Self-pay | Admitting: Cardiology

## 2023-05-03 LAB — HEMOGLOBIN A1C
Est. average glucose Bld gHb Est-mCnc: 105 mg/dL
Hgb A1c MFr Bld: 5.3 % (ref 4.8–5.6)

## 2023-05-03 NOTE — Telephone Encounter (Signed)
 Scheduled 06-28-23

## 2023-05-16 IMAGING — US US OB < 14 WEEKS - US OB TV
1 series · 15 of 28 positions shown · non-contrast
Comparison: None Available.

CLINICAL DATA: Vaginal bleeding and epigastric pain.

EXAM:
OBSTETRIC <14 WK US AND TRANSVAGINAL OB US
TECHNIQUE: Both transabdominal and transvaginal ultrasound examinations were
performed for complete evaluation of the gestation as well as the
maternal uterus, adnexal regions, and pelvic cul-de-sac.
Transvaginal technique was performed to assess early pregnancy.

[Series 1: us ob < 14 weeks - us ob tv · 15 of 53 slices shown]
[im 1/53]
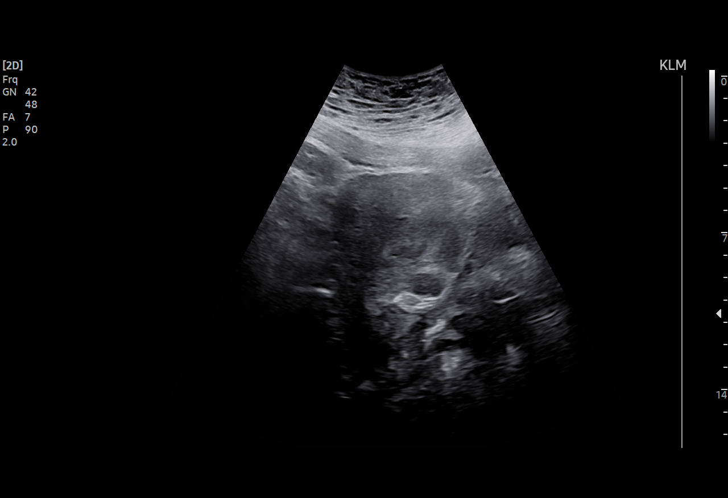
[im 4/53]
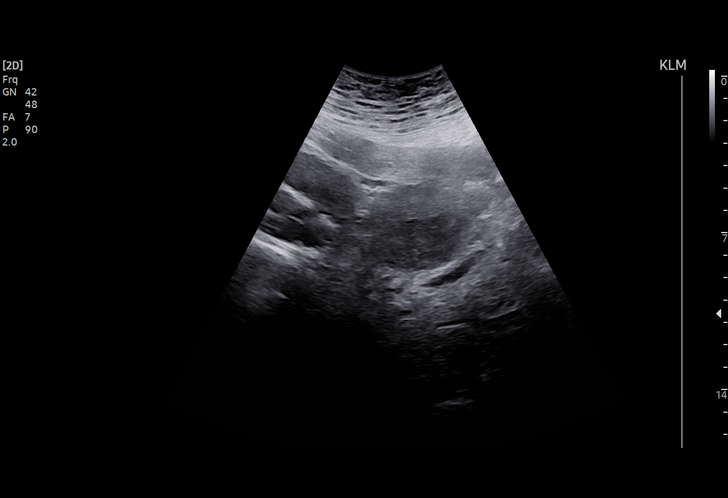
[im 8/53]
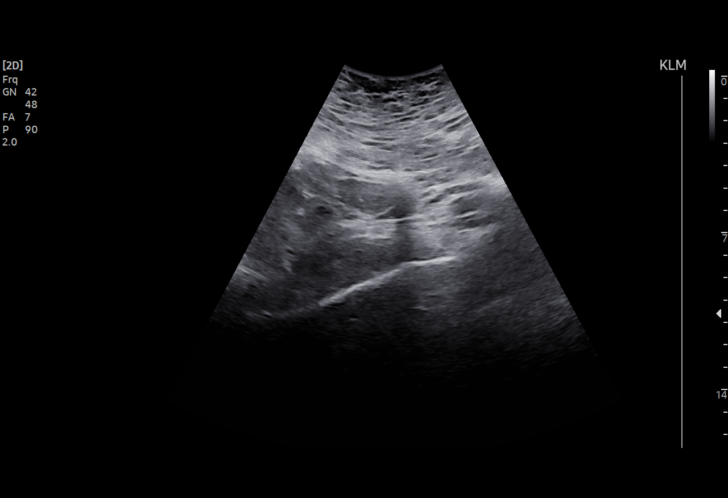
[im 12/53]
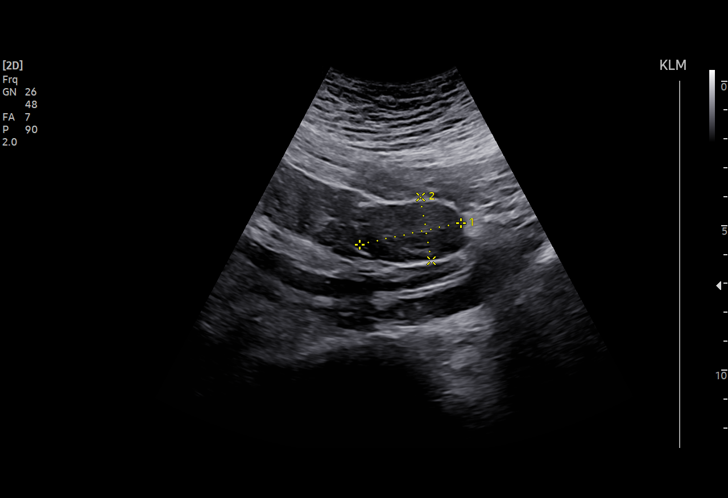
[im 16/53]
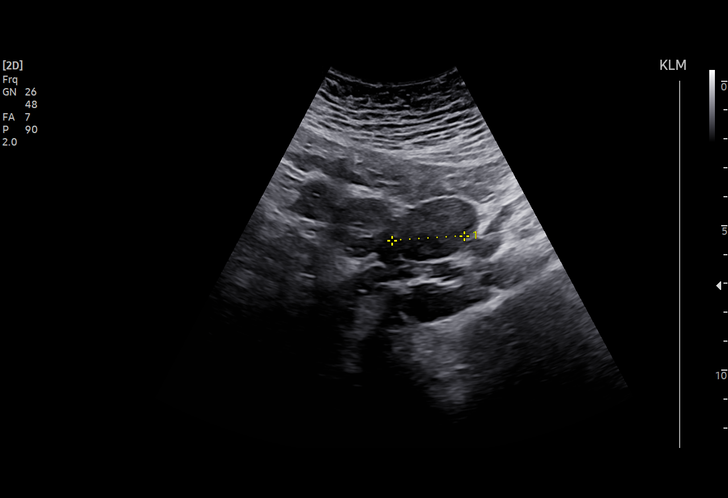
[im 20/53]
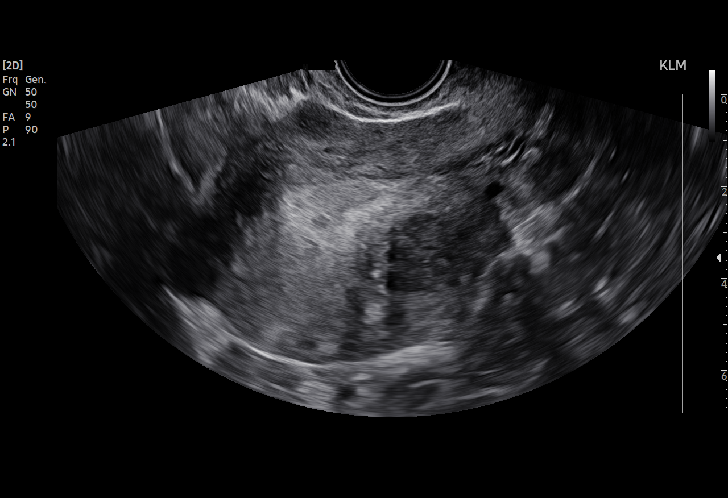
[im 24/53]
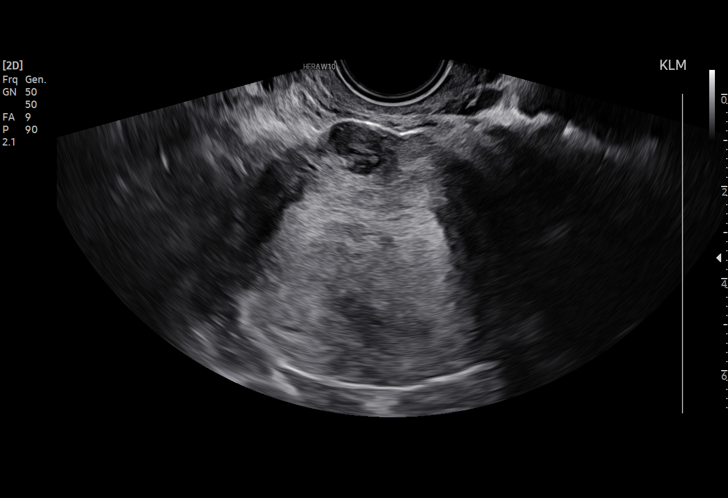
[im 27/53]
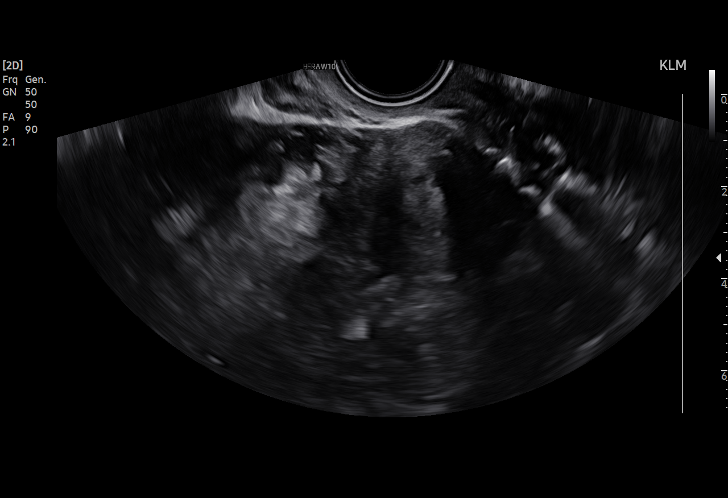
[im 29/53]
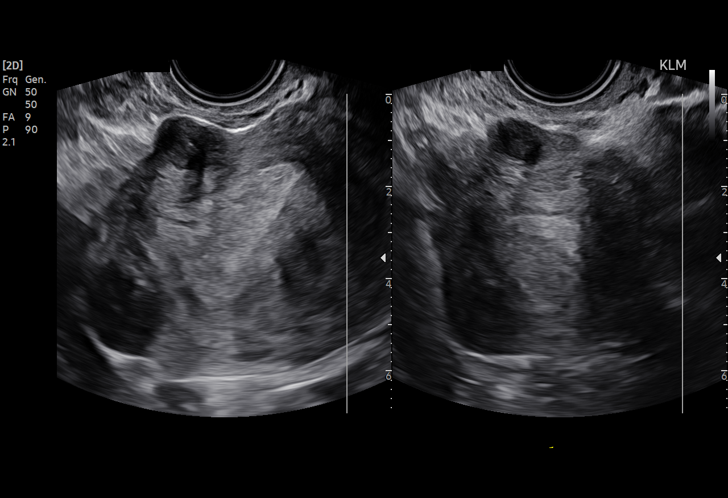
[im 33/53]
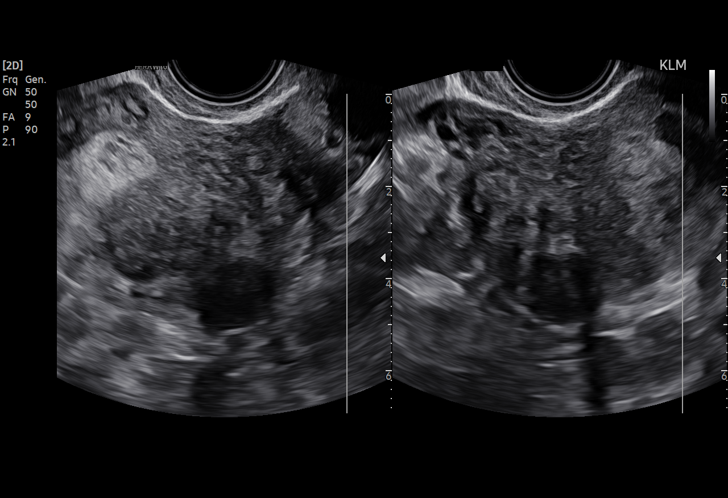
[im 37/53]
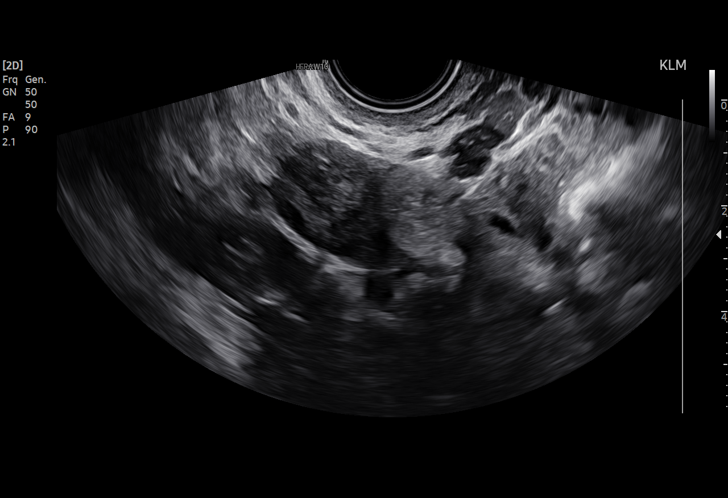
[im 41/53]
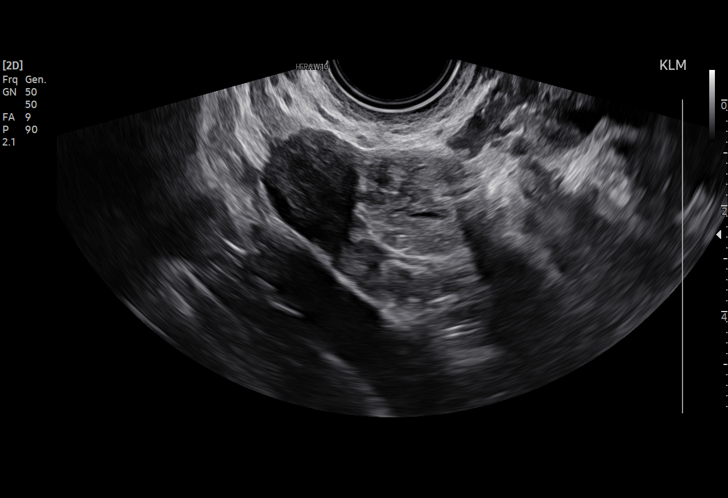
[im 45/53]
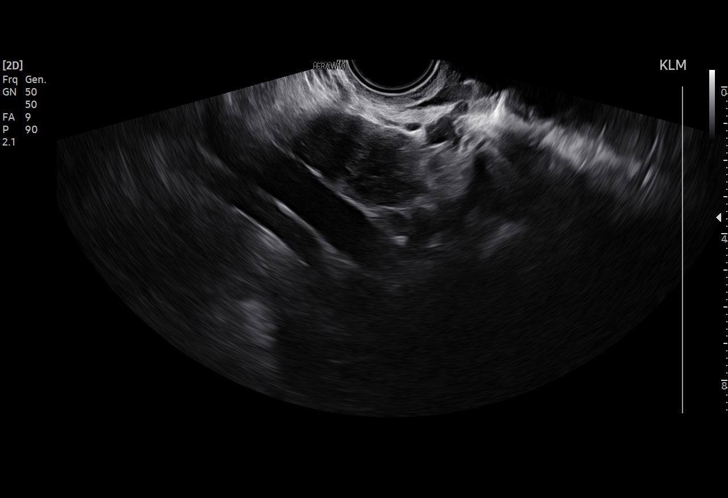
[im 49/53]
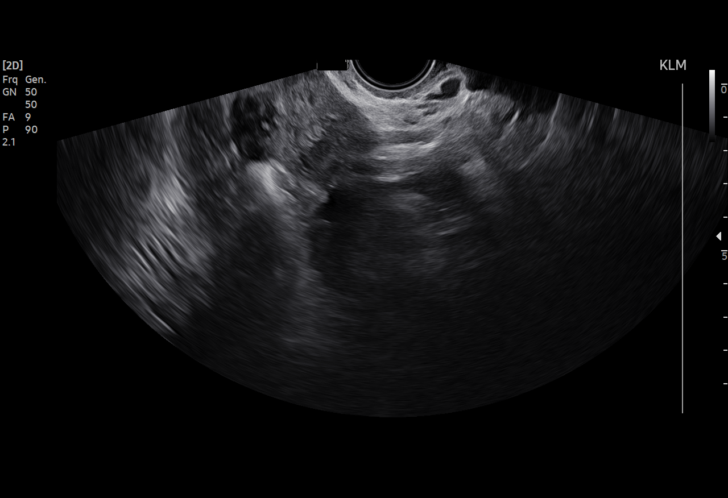
[im 53/53]
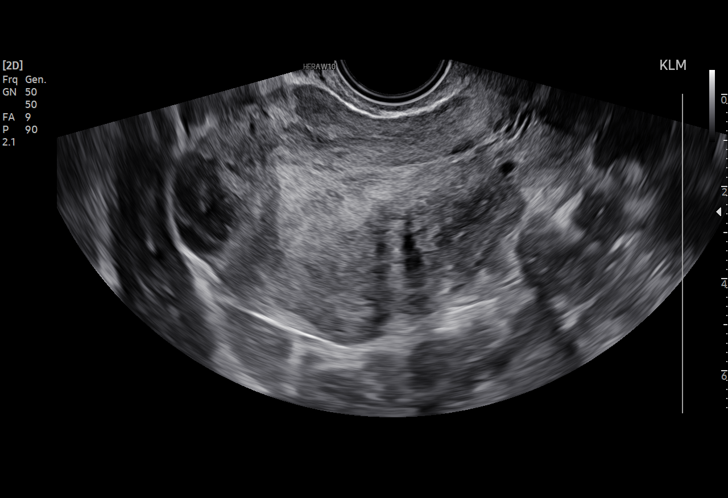

[15 of 28 positions shown; findings below may reference images not displayed]

FINDINGS: Intrauterine gestational sac: None

Yolk sac:  Not Visualized.

Embryo:  Not Visualized.

Cardiac Activity: Not Visualized.

Heart Rate: N/A  bpm

Subchorionic hemorrhage:  None visualized.

Maternal uterus/adnexae: Multiple small heterogeneous uterine
fibroids are seen. The largest measures approximately 1.9 cm x
cm x 1.6 cm.

The bilateral ovaries are visualized and are normal in appearance.

There is no evidence of pelvic free fluid.
IMPRESSION: 1. No evidence of an intrauterine pregnancy. Correlation with
follow-up pelvic ultrasound and serial beta HCG levels is
recommended if this remains of clinical concern.
2. Multiple heterogeneous uterine fibroids.

## 2023-05-18 ENCOUNTER — Encounter: Payer: Self-pay | Admitting: Cardiology

## 2023-05-18 ENCOUNTER — Ambulatory Visit: Payer: Medicaid Other | Attending: Cardiology | Admitting: Cardiology

## 2023-05-18 VITALS — BP 129/76 | HR 74 | Ht 71.0 in | Wt 311.8 lb

## 2023-05-18 DIAGNOSIS — Z79899 Other long term (current) drug therapy: Secondary | ICD-10-CM

## 2023-05-18 DIAGNOSIS — I10 Essential (primary) hypertension: Secondary | ICD-10-CM | POA: Diagnosis not present

## 2023-05-18 LAB — COMPREHENSIVE METABOLIC PANEL WITH GFR
ALT: 15 IU/L (ref 0–32)
AST: 16 IU/L (ref 0–40)
Albumin: 4 g/dL (ref 3.9–4.9)
Alkaline Phosphatase: 100 IU/L (ref 44–121)
BUN/Creatinine Ratio: 12 (ref 9–23)
BUN: 9 mg/dL (ref 6–20)
Bilirubin Total: 0.4 mg/dL (ref 0.0–1.2)
CO2: 23 mmol/L (ref 20–29)
Calcium: 9.1 mg/dL (ref 8.7–10.2)
Chloride: 103 mmol/L (ref 96–106)
Creatinine, Ser: 0.74 mg/dL (ref 0.57–1.00)
Globulin, Total: 3.1 g/dL (ref 1.5–4.5)
Glucose: 81 mg/dL (ref 70–99)
Potassium: 4.2 mmol/L (ref 3.5–5.2)
Sodium: 139 mmol/L (ref 134–144)
Total Protein: 7.1 g/dL (ref 6.0–8.5)
eGFR: 105 mL/min/{1.73_m2} (ref >59)

## 2023-05-18 LAB — MAGNESIUM: Magnesium: 1.9 mg/dL (ref 1.6–2.3)

## 2023-05-18 NOTE — Patient Instructions (Signed)
 Medication Instructions:  Your physician recommends that you continue on your current medications as directed. Please refer to the Current Medication list given to you today.  *If you need a refill on your cardiac medications before your next appointment, please call your pharmacy*  Lab Work: CMET, Mag If you have labs (blood work) drawn today and your tests are completely normal, you will receive your results only by: MyChart Message (if you have MyChart) OR A paper copy in the mail If you have any lab test that is abnormal or we need to change your treatment, we will call you to review the results.  Follow-Up: At [facility], you and your health needs are our priority.  As part of our continuing mission to provide you with exceptional heart care, our providers are all part of one team.  This team includes your primary Cardiologist (physician) and Advanced Practice Providers or APPs (Physician Assistants and Nurse Practitioners) who all work together to provide you with the care you need, when you need it.  Your next appointment:   6 month(s)  Provider:   Thomasene Ripple, DO    Other Instructions:   1st Floor: - Lobby - Registration  - Pharmacy  - Lab - Cafe  2nd Floor: - PV Lab - Diagnostic Testing (echo, CT, nuclear med)  3rd Floor: - Vacant  4th Floor: - TCTS (cardiothoracic surgery) - AFib Clinic - Structural Heart Clinic - Vascular Surgery  - Vascular Ultrasound  5th Floor: - HeartCare Cardiology (general and EP) - Clinical Pharmacy for coumadin, hypertension, lipid, weight-loss medications, and med management appointments    Valet parking services will be available as well.
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
## 2023-05-20 ENCOUNTER — Encounter: Payer: Self-pay | Admitting: Cardiology

## 2023-06-28 ENCOUNTER — Ambulatory Visit (INDEPENDENT_AMBULATORY_CARE_PROVIDER_SITE_OTHER): Admitting: Family Medicine

## 2023-06-28 ENCOUNTER — Encounter (HOSPITAL_BASED_OUTPATIENT_CLINIC_OR_DEPARTMENT_OTHER): Payer: Self-pay | Admitting: Family Medicine

## 2023-06-28 VITALS — BP 126/85 | HR 65 | Ht 71.0 in | Wt 311.8 lb

## 2023-06-28 DIAGNOSIS — R002 Palpitations: Secondary | ICD-10-CM | POA: Diagnosis not present

## 2023-06-28 DIAGNOSIS — I10 Essential (primary) hypertension: Secondary | ICD-10-CM

## 2023-06-28 NOTE — Progress Notes (Signed)
 New Patient Office Visit  Subjective:   Tina Melton 11-18-1985 06/28/2023  Chief Complaint  Patient presents with   New Patient (Initial Visit)    Patient is here today to get established with the practice. States she has had problems where her legs will become numb and also states occasionally she will see stars but does not associate it with a migraine.    HPI: KAMAL VAUPEL presents today to establish care at [facility] at [facility]. Introduced to Publishing rights manager role and practice setting.  All questions answered.   HYPERTENSION: QUANTERIA TEICHMAN presents for the medical management of hypertension. She is seeing Cardiology for postpartum HTN every 6 months. She states in previous pregnancies her BP elevates at 6-7 months. She states after 1 year after birth of child her BP improves without medication.  Patient's current hypertension medication regimen is: Amlodipine  10mg , Lisinopril  40mg   Patient is  currently taking prescribed medications for HTN.  Patient is  regularly keeping a check on BP at home.  Adhering to low sodium diet: Yes Exercising Regularly: Yes Denies headache, dizziness, CP, SHOB, vision changes.   BP Readings from Last 3 Encounters:  06/28/23 126/85  05/18/23 129/76  04/30/23 137/86     NEAR SYNCOPE CONCERN:  Patient states she has noticed "seeing stars" when bending over to pick up items. She states at times the episodes will last for a few seconds to a few minutes. She reports intermittent blurred vision and headaches. Denies nausea , vomiting. Reports intermittent ringing in the ears bilaterally. No LOC reported. She reports resolution of symptoms with resting. Denies chest pain or shortness. She has noticed her heart racing frequently. She states episodes of palpitations can occur at time of rest and exercise. Resolve with rest.    The following portions of the patient's history were reviewed and updated as  appropriate: past medical history, past surgical history, family history, social history, allergies, medications, and problem list.   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   History of chicken pox    History of chlamydia    History of hematuria 09/18/2021   History of hemorrhage specific to perinatal period    6-7 mos bleeding x2, heavy bldg with 2nd preg was told baby nicked 2 blood vessels   History of pre-eclampsia in prior pregnancy, currently pregnant 01/14/2022   Baseline labs  ASA   Obesity    Panic attacks    Pregnancy induced hypertension    Pregnancy with history of ectopic pregnancy, antepartum 12/11/2021   Proteinuria 04/17/2011   Yeast infection    Past Surgical History:  Procedure Laterality Date   INDUCED ABORTION     LAPAROSCOPIC UNILATERAL SALPINGECTOMY Left 08/11/2021   Procedure: LAPAROSCOPIC LEFT SALPINGECTOMY WITH REMOVAL OF ECTOPIC PREGNANCY;  Surgeon: Raynell Caller, MD;  Location: [facility] OR;  Service: Gynecology;  Laterality: Left;   LIPOMA EXCISION Right 10/21/2021   Procedure: EXCISION LIPOMA;  Surgeon: Emmalene Hare, MD;  Location: [facility] ORS;  Service: General;  Laterality: Right;   Family History  Problem Relation Age of  Onset   Mental illness Mother        schizophrenia and panic attacks   Rheum arthritis Father    Arthritis Father    Birth defects Son        extra digit   Hypertension Maternal Grandmother    Stroke Maternal Grandmother    Dementia Maternal Grandmother    Diabetes Maternal Grandfather    Rheum arthritis Paternal  Grandmother    Arthritis Paternal Grandmother    Diabetes Paternal Grandmother    Prostate cancer Neg Hx    Bladder Cancer Neg Hx    Kidney cancer Neg Hx    Asthma Neg Hx    Heart disease Neg Hx    Social History   Socioeconomic History   Marital status: Single    Spouse name: Not on file   Number of children: 2   Years of education: Not on file   Highest education level: Not on file  Occupational History   Occupation: mental health therapist  Tobacco Use   Smoking status: Never   Smokeless tobacco: Never  Vaping Use   Vaping status: Never Used  Substance and Sexual Activity   Alcohol use: No   Drug use: Not Currently    Comment: marijuana years ago   Sexual activity: Yes    Birth control/protection: None  Other Topics Concern   Not on file  Social History Narrative   Not on file   Social Drivers of Health   Financial Resource Strain: Not on file  Food Insecurity: No Food Insecurity (07/15/2022)   Hunger Vital Sign    Worried About Running Out of Food in the Last Year: Never true    Ran Out of Food in the Last Year: Never true  Transportation Needs: No Transportation Needs (07/15/2022)   PRAPARE - Administrator, Civil Service (Medical): No    Lack of Transportation (Non-Medical): No  Physical Activity: Not on file  Stress: Not on file  Social Connections: Unknown (06/27/2021)   Received from [facility], [facility]   Social Network    Social Network: Not on file  Intimate Partner Violence: Not At Risk (07/15/2022)   Humiliation, Afraid, Rape, and Kick questionnaire    Fear of Current or Ex-Partner: No    Emotionally Abused: No    Physically Abused: No    Sexually Abused: No   Outpatient Medications Prior to Visit  Medication Sig Dispense Refill   amLODipine  (NORVASC ) 10 MG tablet Take 1 tablet (10 mg total) by mouth daily. 90 tablet 1   lisinopril  (ZESTRIL ) 40 MG tablet Take 1 tablet (40 mg total) by mouth daily.     No  facility-administered medications prior to visit.   Allergies  Allergen Reactions   Lemon Juice Anaphylaxis   Lemon Oil Anaphylaxis   Gemtesa  [Vibegron ] Rash    ROS: A complete ROS was performed with pertinent positives/negatives noted in the HPI. The remainder of the ROS are negative.   Objective:   Today's Vitals   06/28/23 1005  BP: 126/85  Pulse: 65  SpO2: 100%  Weight: (!) 311 lb 12.8 oz (141.4 kg)  Height: 5\' 11"  (1.803 m)    GENERAL: Well-appearing, in NAD. Well nourished.  SKIN: Pink, warm and dry.  Head: Normocephalic. NECK: Trachea midline. Full ROM w/o pain or tenderness. No lymphadenopathy.  EARS: Tympanic membranes are intact, translucent without bulging and without drainage. Appropriate landmarks visualized.  EYES: Conjunctiva clear without exudates. EOMI, PERRL, no drainage present.  NOSE: Septum midline w/o deformity. Nares patent, mucosa pink and non-inflamed w/o drainage. No sinus tenderness.  THROAT: Uvula midline. Oropharynx clear.  Mucous membranes pink and moist.  RESPIRATORY: Chest wall symmetrical. Respirations even and non-labored. Breath sounds clear to auscultation bilaterally.  CARDIAC: S1, S2 present, regular rate and rhythm without murmur or gallops. Peripheral pulses 2+ bilaterally. Carotid arteries are without bruit or thrill.  MSK: Muscle tone and strength appropriate for age. Joints w/o tenderness, redness, or swelling.  EXTREMITIES: Without clubbing, cyanosis, or edema.  NEUROLOGIC: No motor or sensory deficits. Steady, even gait. C2-C12 intact. Dix-hallpike negative.  PSYCH/MENTAL STATUS: Alert, oriented x 3. Cooperative, appropriate mood and affect.      Assessment & Plan:  1. Palpitations (Primary) No signs of vertigo contributing. Recommend obtaining labs today with iron studyes, thryoid and CBC to rule out possible anemia. Recent Bmp and mag reviewed by PCP. Pending labs, will consider Zio patch if needed for further evaluation. Safety  plan for SE reviewed.   - Iron, TIBC and Ferritin Panel - CBC with Differential/Platelet - TSH Rfx on Abnormal to Free T4   2. Primary hypertension Stable. Well controlled. Continue  cardiology management.    Patient to reach out to office if new, worrisome, or unresolved symptoms arise or if no improvement in patient's condition. Patient verbalized understanding and is agreeable to treatment plan. All questions answered to patient's satisfaction.    Return in about 2 months (around 08/28/2023) for ANNUAL PHYSICAL, follow up Palpitations .    Nonda Bays, [place]

## 2023-06-29 ENCOUNTER — Ambulatory Visit (HOSPITAL_BASED_OUTPATIENT_CLINIC_OR_DEPARTMENT_OTHER): Payer: Self-pay | Admitting: Family Medicine

## 2023-06-29 ENCOUNTER — Other Ambulatory Visit: Payer: Self-pay | Admitting: Cardiology

## 2023-06-29 DIAGNOSIS — I10 Essential (primary) hypertension: Secondary | ICD-10-CM

## 2023-06-29 DIAGNOSIS — E611 Iron deficiency: Secondary | ICD-10-CM | POA: Insufficient documentation

## 2023-06-29 LAB — CBC WITH DIFFERENTIAL/PLATELET
Basophils Absolute: 0 10*3/uL (ref 0.0–0.2)
Basos: 1 %
EOS (ABSOLUTE): 0.1 10*3/uL (ref 0.0–0.4)
Eos: 3 %
Hematocrit: 38 % (ref 34.0–46.6)
Hemoglobin: 11.5 g/dL (ref 11.1–15.9)
Immature Grans (Abs): 0 10*3/uL (ref 0.0–0.1)
Immature Granulocytes: 0 %
Lymphocytes Absolute: 1.5 10*3/uL (ref 0.7–3.1)
Lymphs: 30 %
MCH: 25.2 pg — ABNORMAL LOW (ref 26.6–33.0)
MCHC: 30.3 g/dL — ABNORMAL LOW (ref 31.5–35.7)
MCV: 83 fL (ref 79–97)
Monocytes Absolute: 0.5 10*3/uL (ref 0.1–0.9)
Monocytes: 10 %
Neutrophils Absolute: 2.8 10*3/uL (ref 1.4–7.0)
Neutrophils: 56 %
Platelets: 319 10*3/uL (ref 150–450)
RBC: 4.57 x10E6/uL (ref 3.77–5.28)
RDW: 13.4 % (ref 11.7–15.4)
WBC: 4.8 10*3/uL (ref 3.4–10.8)

## 2023-06-29 LAB — IRON,TIBC AND FERRITIN PANEL
Ferritin: 139 ng/mL (ref 15–150)
Iron Saturation: 10 % — ABNORMAL LOW (ref 15–55)
Iron: 30 ug/dL (ref 27–159)
Total Iron Binding Capacity: 294 ug/dL (ref 250–450)
UIBC: 264 ug/dL (ref 131–425)

## 2023-06-29 LAB — TSH RFX ON ABNORMAL TO FREE T4: TSH: 0.807 u[IU]/mL (ref 0.450–4.500)

## 2023-06-29 NOTE — Progress Notes (Signed)
 Your iron level is slightly low and may be contributing to symptoms of dizziness or palpitations.  Are you currently breast-feeding?  If not breastfeeding, I would recommend starting a over-the-counter iron supplement such as Slow Fe and taking 1 tablet approximately 3 days a week.  We could recheck this in approximately 3 to 4 months.  If your symptoms continue to worsen or do not improve, please reach out to the office and we could proceed with a ZIO heart monitor patch.  If you are breast-feeding, increasing your dietary sources of iron in the foods such as below will also help.  IRON RICH FOODS:  Beef, pork, chicken, [place], scallops, shrimp, salmon, eggs, canned chunk like tuna, grains, fish eggs.  Peanut butter, beans (black, kidney, lima, pinto), lentils, tofu, hummus, dark leafy green vegetables (current greens, kale, broccoli), oatmeal.  Iron enriched breads, pastas, and cereals.

## 2023-10-04 ENCOUNTER — Ambulatory Visit (HOSPITAL_BASED_OUTPATIENT_CLINIC_OR_DEPARTMENT_OTHER): Admitting: Family Medicine

## 2023-10-07 ENCOUNTER — Ambulatory Visit (HOSPITAL_BASED_OUTPATIENT_CLINIC_OR_DEPARTMENT_OTHER): Admitting: Family Medicine

## 2023-10-29 ENCOUNTER — Encounter: Payer: Self-pay | Admitting: Cardiology

## 2023-11-19 ENCOUNTER — Encounter (HOSPITAL_BASED_OUTPATIENT_CLINIC_OR_DEPARTMENT_OTHER): Payer: Self-pay | Admitting: Family Medicine

## 2023-11-19 ENCOUNTER — Ambulatory Visit (INDEPENDENT_AMBULATORY_CARE_PROVIDER_SITE_OTHER): Payer: Self-pay | Admitting: Family Medicine

## 2023-11-19 VITALS — BP 127/97 | HR 84 | Ht 71.0 in | Wt 319.8 lb

## 2023-11-19 DIAGNOSIS — M79642 Pain in left hand: Secondary | ICD-10-CM

## 2023-11-19 DIAGNOSIS — I10 Essential (primary) hypertension: Secondary | ICD-10-CM

## 2023-11-19 DIAGNOSIS — E611 Iron deficiency: Secondary | ICD-10-CM

## 2023-11-19 DIAGNOSIS — Z1231 Encounter for screening mammogram for malignant neoplasm of breast: Secondary | ICD-10-CM

## 2023-11-19 MED ORDER — AMLODIPINE BESYLATE 5 MG PO TABS: 5.0000 mg | ORAL_TABLET | Freq: Every day | ORAL | Status: AC

## 2023-11-19 MED ORDER — MELOXICAM 15 MG PO TABS: 15.0000 mg | ORAL_TABLET | Freq: Every day | ORAL | 0 refills | Status: DC

## 2023-11-19 NOTE — Patient Instructions (Signed)
 Please take Meloxicam when you stop breastfeeding..  Use ice, wrist brace if needed.  Stretches twice daily.  If no improvement, reach out to PCP

## 2023-11-19 NOTE — Progress Notes (Signed)
 Subjective:   Tina Melton January 23, 1986 11/19/2023  Chief Complaint  Patient presents with   Medical Management of Chronic Issues    2-3 month follow up; pt states two days ago, her fingers locked up on her to where she could not move them for about . Now states she has pain in her hand, arms, and also in her legs.    Discussed the use of AI scribe software for clinical note transcription with the patient, who gave verbal consent to proceed.  History of Present Illness Tina Melton is a 38 year old female with hypertension who presents with hand pain and recent medication issues.  LEFT HAND PAIN;  She experiences left hand pain that began while cooking, characterized by her fingers locking and an inability to bend or separate them for about 30 minutes. The sensation includes twitching and shooting pain from her hand  persisting particularly in her third finger and exacerbated by pressure. No recent injuries, falls, or accidents. She is left-handed.She recently returned to working out in the gym in the past 2-3 weeks. Frequently typing on keyboard and computer work. No numbness or tingling in the hand, but pain is present in the third finger when pressure is applied. No recent injuries or accidents reported.  HTN:  She has a history of hypertension and is prescribed lisinopril  40 mg and amlodipine  5 mg. There was confusion at the pharmacy where she received both 5 mg and 10 mg doses of amlodipine  despite only needing the 5 mg. Tina Melton Her blood pressure tends to rise with weight gain and stress, and she has recently resumed working out. She is currently taking Lisinopril  40mg  and Amlodipine  5mg  with BP control per patient.    IRON DEFICIENCY:  She was previously advised to take a prenatal vitamin with iron due to iron deficiency while breastfeeding, but she has not been consistent with this regimen. She stopped breastfeeding in early September but resumed due to her daughter's illness  with RSV, which required her to increase her caloric intake to maintain milk production and has contributed to weight gain.     Lab Results  Component Value Date   WBC 4.8 06/28/2023   HGB 11.5 06/28/2023   HCT 38.0 06/28/2023   MCV 83 06/28/2023   PLT 319 06/28/2023    Lab Results  Component Value Date   IRON 30 06/28/2023   TIBC 294 06/28/2023   FERRITIN 139 06/28/2023   Wt Readings from Last 3 Encounters:  11/19/23 (!) 319 lb 12.8 oz (145.1 kg)  06/28/23 (!) 311 lb 12.8 oz (141.4 kg)  05/18/23 (!) 311 lb 12.8 oz (141.4 kg)      The following portions of the patient's history were reviewed and updated as appropriate: past medical history, past surgical history, family history, social history, allergies, medications, and problem list.   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   History of chicken pox    History of chlamydia    History of hematuria 09/18/2021   History of hemorrhage specific to perinatal period  6-7 mos bleeding x2, heavy bldg with 2nd preg was told baby nicked 2 blood vessels   History of pre-eclampsia in prior pregnancy, currently pregnant 01/14/2022   Baseline labs  ASA   Obesity    Panic attacks    Pregnancy induced hypertension    Pregnancy with history of ectopic pregnancy, antepartum 12/11/2021   Proteinuria 04/17/2011   Yeast infection    Past Surgical History:  Procedure Laterality Date   INDUCED ABORTION     LAPAROSCOPIC UNILATERAL SALPINGECTOMY Left 08/11/2021   Procedure:  LAPAROSCOPIC LEFT SALPINGECTOMY WITH REMOVAL OF ECTOPIC PREGNANCY;  Surgeon: Izell Harari, MD;  Location: [facility] OR;  Service: Gynecology;  Laterality: Left;   LIPOMA EXCISION Right 10/21/2021   Procedure: EXCISION LIPOMA;  Surgeon: Desiderio Schanz, MD;  Location: [facility] ORS;  Service: General;  Laterality: Right;   Family History  Problem Relation Age of Onset   Mental illness Mother        schizophrenia and panic attacks   Rheum arthritis Father    Arthritis Father    Birth defects Son        extra digit   Hypertension Maternal Grandmother    Stroke Maternal Grandmother    Dementia Maternal Grandmother    Diabetes Maternal Grandfather    Rheum arthritis Paternal Grandmother    Arthritis Paternal Grandmother    Diabetes Paternal Grandmother    Prostate cancer Neg Hx    Bladder Cancer Neg Hx    Kidney cancer Neg Hx    Asthma Neg Hx    Heart disease Neg Hx    Outpatient Medications Prior to Visit  Medication Sig Dispense Refill   lisinopril  (ZESTRIL ) 40 MG tablet Take 1 tablet (40 mg total) by mouth daily.     amLODipine  (NORVASC ) 10 MG tablet TAKE 1 TABLET BY MOUTH EVERY DAY 90 tablet 3   No facility-administered medications prior to visit.   Allergies  Allergen Reactions   Lemon Juice Anaphylaxis   Lemon Oil Anaphylaxis   Gemtesa  [Vibegron ] Rash     ROS: A complete ROS was performed with pertinent positives/negatives noted in the HPI. The remainder of the ROS are negative.    Objective:   Today's Vitals   11/19/23 1104 11/19/23 1158  BP: (!) 121/94 (!) 127/97  Pulse: 84   SpO2: 100%   Weight: (!) 319 lb 12.8 oz (145.1 kg)   Height: 5' 11 (1.803 m)     Physical Exam  MUSCULOSKELETAL: Pain in left hand on palpation. Pain in third finger of left hand.  GENERAL: Well-appearing, in NAD. Well nourished.  SKIN: Pink, warm and dry.  Head: Normocephalic. NECK: Trachea midline. Full ROM w/o pain or tenderness.  RESPIRATORY: Chest wall symmetrical. Respirations even and  non-labored.  MSK: Muscle tone and strength appropriate for age. Joints w/out swelling or redness. + pain with palpation to 3rd metacarpal of left hand. Full ROM, grip strength. Negative Phalens, tinels. Negative for pain with flexion and extension.  EXTREMITIES: Without clubbing, cyanosis, or edema.  NEUROLOGIC: No motor or sensory deficits. Steady, even gait. C2-C12 intact.  PSYCH/MENTAL STATUS: Alert, oriented x 3. Cooperative, appropriate mood and affect.   Health Maintenance Due  Topic Date Due   COVID-19 Vaccine (1) Never done   Mammogram  Never done   HPV VACCINES (1 - 3-dose SCDM series) Never done   DTaP/Tdap/Td (9 - Td or Tdap) 04/06/2022       Assessment & Plan:  1. Chronic hypertension (Primary) Slightly elevated above control today.  Patient would like to continue current regimen and will check with recent increase of exercise. She states BP does improve significantly with weight loss. Will continue with home monitoring and follow up as scheduled. BMP with labs today.  - amLODipine  (NORVASC ) 5 MG tablet; Take 1 tablet (5 mg total) by mouth daily. - Basic metabolic panel with GFR  2. Iron deficiency Will check Iron and CBC with labs. Adequate intake with diet.  - Iron, TIBC and Ferritin Panel - CBC with Differential/Platelet  3. Breast cancer screening by mammogram - MM 3D SCREENING MAMMOGRAM BILATERAL BREAST; Future  4. Left hand pain Possible tendinitis causing flexion and muscle spasm given new onset of exercise and overuse of left hand. Neuro exam is unremarkable. Recommend anti-inflammatories with Mobic 15mg  x 2 weeks with food and will check electrolytes with labs today. Recommend stretching provided with AVS, ice, and brace if needed. If no improvement in 2 week, reach out to PCP.  - Basic metabolic panel with GFR - Magnesium     Meds ordered this encounter  Medications   amLODipine  (NORVASC ) 5 MG tablet    Sig: Take 1 tablet (5 mg total) by mouth daily.     Supervising Provider:   DE PERU, RAYMOND J [[id number]]   meloxicam (MOBIC) 15 MG tablet    Sig: Take 1 tablet (15 mg total) by mouth daily for 15 days.    Dispense:  15 tablet    Refill:  0    Supervising Provider:   DE PERU, RAYMOND J [[id number]]   Lab Orders         Basic metabolic panel with GFR         Magnesium          Iron, TIBC and Ferritin Panel         CBC with Differential/Platelet      Return in about 4 months (around 03/21/2024) for ANNUAL PHYSICAL.    Patient to reach out to office if new, worrisome, or unresolved symptoms arise or if no improvement in patient's condition. Patient verbalized understanding and is agreeable to treatment plan. All questions answered to patient's satisfaction.    Tina Melton, [place]

## 2023-11-20 LAB — CBC WITH DIFFERENTIAL/PLATELET
Basophils Absolute: 0 x10E3/uL (ref 0.0–0.2)
Basos: 1 %
EOS (ABSOLUTE): 0.2 x10E3/uL (ref 0.0–0.4)
Eos: 2 %
Hematocrit: 36 % (ref 34.0–46.6)
Hemoglobin: 11.3 g/dL (ref 11.1–15.9)
Immature Grans (Abs): 0 x10E3/uL (ref 0.0–0.1)
Immature Granulocytes: 0 %
Lymphocytes Absolute: 1.6 x10E3/uL (ref 0.7–3.1)
Lymphs: 24 %
MCH: 25.9 pg — ABNORMAL LOW (ref 26.6–33.0)
MCHC: 31.4 g/dL — ABNORMAL LOW (ref 31.5–35.7)
MCV: 82 fL (ref 79–97)
Monocytes Absolute: 0.5 x10E3/uL (ref 0.1–0.9)
Monocytes: 7 %
Neutrophils Absolute: 4.4 x10E3/uL (ref 1.4–7.0)
Neutrophils: 65 %
Platelets: 356 x10E3/uL (ref 150–450)
RBC: 4.37 x10E6/uL (ref 3.77–5.28)
RDW: 13.1 % (ref 11.7–15.4)
WBC: 6.6 x10E3/uL (ref 3.4–10.8)

## 2023-11-20 LAB — IRON,TIBC AND FERRITIN PANEL
Ferritin: 210 ng/mL — ABNORMAL HIGH (ref 15–150)
Iron Saturation: 10 % — ABNORMAL LOW (ref 15–55)
Iron: 33 ug/dL (ref 27–159)
Total Iron Binding Capacity: 344 ug/dL (ref 250–450)
UIBC: 311 ug/dL (ref 131–425)

## 2023-11-20 LAB — BASIC METABOLIC PANEL WITH GFR
BUN/Creatinine Ratio: 28 — ABNORMAL HIGH (ref 9–23)
BUN: 19 mg/dL (ref 6–24)
CO2: 23 mmol/L (ref 20–29)
Calcium: 9 mg/dL (ref 8.7–10.2)
Chloride: 101 mmol/L (ref 96–106)
Creatinine, Ser: 0.67 mg/dL (ref 0.57–1.00)
Glucose: 70 mg/dL (ref 70–99)
Potassium: 4.3 mmol/L (ref 3.5–5.2)
Sodium: 137 mmol/L (ref 134–144)
eGFR: 113 mL/min/1.73 (ref >59)

## 2023-11-20 LAB — MAGNESIUM: Magnesium: 2 mg/dL (ref 1.6–2.3)

## 2023-11-23 ENCOUNTER — Ambulatory Visit (HOSPITAL_BASED_OUTPATIENT_CLINIC_OR_DEPARTMENT_OTHER): Payer: Self-pay | Admitting: Family Medicine

## 2023-11-23 NOTE — Progress Notes (Signed)
 Hi Tina Melton,  Your electrolytes and kidney function are stable. Your hemoglobin, hematocrit are stable. Your MCH, MCHC are slightly low and correlate with slightly decreased iron saturation. Increasing dietary sources of iron below can help. Taking a multivitamin with iron will also help and you could take this on an every other day basis. I would recommend we recheck this at your next appt in February.

## 2023-11-24 ENCOUNTER — Other Ambulatory Visit: Payer: Self-pay

## 2023-11-24 ENCOUNTER — Emergency Department (HOSPITAL_COMMUNITY): Payer: Self-pay

## 2023-11-24 ENCOUNTER — Emergency Department (HOSPITAL_COMMUNITY)
Admission: EM | Admit: 2023-11-24 | Discharge: 2023-11-25 | Disposition: A | Discharge status: Home / Self Care | Payer: Self-pay | Attending: Emergency Medicine | Admitting: Emergency Medicine

## 2023-11-24 ENCOUNTER — Encounter (HOSPITAL_COMMUNITY): Payer: Self-pay

## 2023-11-24 DIAGNOSIS — Z79899 Other long term (current) drug therapy: Secondary | ICD-10-CM | POA: Insufficient documentation

## 2023-11-24 DIAGNOSIS — I10 Essential (primary) hypertension: Secondary | ICD-10-CM | POA: Insufficient documentation

## 2023-11-24 DIAGNOSIS — R079 Chest pain, unspecified: Secondary | ICD-10-CM | POA: Insufficient documentation

## 2023-11-24 LAB — BASIC METABOLIC PANEL WITH GFR
Anion gap: 11 (ref 5–15)
BUN: 16 mg/dL (ref 6–20)
CO2: 20 mmol/L — ABNORMAL LOW (ref 22–32)
Calcium: 8.6 mg/dL — ABNORMAL LOW (ref 8.9–10.3)
Chloride: 105 mmol/L (ref 98–111)
Creatinine, Ser: 0.74 mg/dL (ref 0.44–1.00)
GFR, Estimated: >60 mL/min (ref >60)
Glucose, Bld: 83 mg/dL (ref 70–99)
Potassium: 4.1 mmol/L (ref 3.5–5.1)
Sodium: 136 mmol/L (ref 135–145)

## 2023-11-24 LAB — TROPONIN I (HIGH SENSITIVITY)
Troponin I (High Sensitivity): 2 ng/L (ref <18)
Troponin I (High Sensitivity): 3 ng/L (ref <18)

## 2023-11-24 LAB — CBC
HCT: 37.8 % (ref 36.0–46.0)
Hemoglobin: 11.8 g/dL — ABNORMAL LOW (ref 12.0–15.0)
MCH: 25.8 pg — ABNORMAL LOW (ref 26.0–34.0)
MCHC: 31.2 g/dL (ref 30.0–36.0)
MCV: 82.7 fL (ref 80.0–100.0)
Platelets: 349 K/uL (ref 150–400)
RBC: 4.57 MIL/uL (ref 3.87–5.11)
RDW: 13.7 % (ref 11.5–15.5)
WBC: 5.7 K/uL (ref 4.0–10.5)
nRBC: 0 % (ref 0.0–0.2)

## 2023-11-24 LAB — HCG, SERUM, QUALITATIVE: Preg, Serum: NEGATIVE

## 2023-11-24 NOTE — ED Triage Notes (Addendum)
 Pt c/o L arm pain and L sided chest pain for several days.

## 2023-11-24 NOTE — ED Provider Notes (Signed)
 [place] EMERGENCY DEPARTMENT AT [facility] Provider Note   CSN: [id number] Arrival date & time: 11/24/23  1123     Patient presents with: Chest Pain   Tina Melton is a 38 y.o. female.  Patient with past medical history significant for hypertension, anemia, panic attacks, obesity, recent evaluation for palpitations presents to the emergency department for evaluation of left-sided chest pain.  Patient states chest pain began on Saturday and has persisted over the weekend.  She states that it improves if she puts pressure on the left side of her chest.  It worsens with movement of left arm.  She denies any known musculoskeletal injury.  The patient states that she does feel short of breath.  Primary care has reportedly wanted to change her medication regimen but the patient is still breast-feeding her child due to a recent infection with RSV.  Pediatrician requested further breast-feeding to help with immunity.  The patient has an appointment with the pediatrician this Friday and is hopeful that she may be able to discontinue breast-feeding at that time so that she may change her other medications.  She states she has had multiple outpatient test for her heart with no specific findings.  She denies having a Holter monitor in the past.  Patient has no abdominal pain, nausea, vomiting.  She does endorse some vague paresthesias in the left hand and left foot.    Chest Pain      Prior to Admission medications   Medication Sig Start Date End Date Taking? Authorizing Provider  amLODipine  (NORVASC ) 5 MG tablet Take 1 tablet (5 mg total) by mouth daily. 11/19/23   Caudle, Thersia Bitters, FNP  lisinopril  (ZESTRIL ) 40 MG tablet Take 1 tablet (40 mg total) by mouth daily. 03/19/23   Tobb, Kardie, DO  meloxicam (MOBIC) 15 MG tablet Take 1 tablet (15 mg total) by mouth daily for 15 days. 11/19/23 12/04/23  Caudle, Thersia Bitters, FNP    Allergies: Lemon juice, Lemon oil, and Gemtesa  [vibegron ]     Review of Systems  Cardiovascular:  Positive for chest pain.    Updated Vital Signs BP (!) 156/85   Pulse 70   Temp 98 F (36.7 C) (Oral)   Resp 18   Ht 5' 11 (1.803 m)   Wt (!) 142.9 kg   LMP 10/30/2023 (Exact Date)   SpO2 100%   Breastfeeding Yes   BMI 43.93 kg/m   Physical Exam Vitals and nursing note reviewed.  Constitutional:      General: She is not in acute distress.    Appearance: She is well-developed. She is obese.  HENT:     Head: Normocephalic and atraumatic.  Eyes:     Conjunctiva/sclera: Conjunctivae normal.  Cardiovascular:     Rate and Rhythm: Normal rate and regular rhythm.     Heart sounds: No murmur heard. Pulmonary:     Effort: Pulmonary effort is normal. No respiratory distress.     Breath sounds: Normal breath sounds.  Chest:     Chest wall: No tenderness.  Abdominal:     Palpations: Abdomen is soft.     Tenderness: There is no abdominal tenderness.  Musculoskeletal:        General: No swelling.     Cervical back: Neck supple.  Skin:    General: Skin is warm and dry.  Neurological:     Mental Status: She is alert.  Psychiatric:        Mood and Affect: Mood normal.     (  all labs ordered are listed, but only abnormal results are displayed) Labs Reviewed  BASIC METABOLIC PANEL WITH GFR - Abnormal; Notable for the following components:      Result Value   CO2 20 (*)    Calcium 8.6 (*)    All other components within normal limits  CBC - Abnormal; Notable for the following components:   Hemoglobin 11.8 (*)    MCH 25.8 (*)    All other components within normal limits  D-DIMER, QUANTITATIVE - Abnormal; Notable for the following components:   D-Dimer, Quant 3.09 (*)    All other components within normal limits  HCG, SERUM, QUALITATIVE  TROPONIN I (HIGH SENSITIVITY)  TROPONIN I (HIGH SENSITIVITY)    EKG: EKG Interpretation Date/Time:  Wednesday November 24 2023 11:45:29 EDT Ventricular Rate:  75 PR Interval:  156 QRS  Duration:  84 QT Interval:  366 QTC Calculation: 408 R Axis:   18  Text Interpretation: Normal sinus rhythm Septal infarct , age undetermined Abnormal ECG When compared with ECG of 02-Oct-2022 13:46, PREVIOUS ECG IS PRESENT No significant change was found Confirmed by Trine Likes [phone]) on 11/24/2023 11:06:27 PM  Radiology: CT Angio Chest PE W and/or Wo Contrast Result Date: 11/25/2023 CLINICAL DATA:  Pulmonary embolism (PE) suspected, low to intermediate prob, positive D-dimer, chest pain EXAM: CT ANGIOGRAPHY CHEST WITH CONTRAST TECHNIQUE: Multidetector CT imaging of the chest was performed using the standard protocol during bolus administration of intravenous contrast. Multiplanar CT image reconstructions and MIPs were obtained to evaluate the vascular anatomy. RADIATION DOSE REDUCTION: This exam was performed according to the departmental dose-optimization program which includes automated exposure control, adjustment of the mA and/or kV according to patient size and/or use of iterative reconstruction technique. CONTRAST:  75mL OMNIPAQUE IOHEXOL 350 MG/ML SOLN COMPARISON:  None Available. FINDINGS: Cardiovascular: No significant coronary artery calcification. Cardiac size is within normal limits. Adequate opacification of the pulmonary arterial tree. No intraluminal filling defect identified to suggest acute pulmonary embolism. Central pulmonary arteries are of normal caliber. No pericardial effusion. No significant atherosclerotic calcification within the thoracic aorta. No aortic aneurysm. Mediastinum/Nodes: No enlarged mediastinal, hilar, or axillary lymph nodes. Thyroid  gland, trachea, and esophagus demonstrate no significant findings. Lungs/Pleura: Lungs are clear. No pleural effusion or pneumothorax. Upper Abdomen: No acute abnormality. Musculoskeletal: No chest wall abnormality. No acute or significant osseous findings. Review of the MIP images confirms the above findings. IMPRESSION: 1. No  pulmonary embolism. No acute intrathoracic pathology identified. Electronically Signed   By: Dorethia Molt M.D.   On: 11/25/2023 01:16   DG Chest 2 View Result Date: 11/24/2023 CLINICAL DATA:  Left-sided chest and left arm pain. EXAM: CHEST - 2 VIEW COMPARISON:  04/24/2023. FINDINGS: The heart size and mediastinal contours are within normal limits. No focal consolidation, pleural effusion, or pneumothorax. No acute osseous abnormality. IMPRESSION: No acute cardiopulmonary findings. Electronically Signed   By: Harrietta Sherry M.D.   On: 11/24/2023 12:45     Procedures   Medications Ordered in the ED  iohexol (OMNIPAQUE) 350 MG/ML injection 75 mL (75 mLs Intravenous Contrast Given 11/25/23 0106)                                    Medical Decision Making Amount and/or Complexity of Data Reviewed Labs: ordered. Radiology: ordered.  Risk Prescription drug management.   This patient presents to the ED for concern of chest pain, this involves  an extensive number of treatment options, and is a complaint that carries with it a high risk of complications and morbidity.  The differential diagnosis includes ACS, pneumonia, MSK pain, PE, dissection, anxiety, GERD, others   Co morbidities / Chronic conditions that complicate the patient evaluation  HTN   Additional history obtained:  Additional history obtained from EMR External records from outside source obtained and reviewed including primary care notes   Lab Tests:  I Ordered, and personally interpreted labs.  The pertinent results include: D-dimer 3.09, negative pregnancy test   Imaging Studies ordered:  I ordered imaging studies including chest x-ray, CT angio chest PE study I independently visualized and interpreted imaging which showed no acute findings I agree with the radiologist interpretation   Cardiac Monitoring: / EKG:  The patient was maintained on a cardiac monitor.  I personally viewed and interpreted the  cardiac monitored which showed an underlying rhythm of: Sinus rhythm   Social Determinants of Health:  Patient has no reported health insurance   Test / Admission - Considered:  Patient with negative troponins and nonischemic EKG.  No sign of ACS at this time.  Patient did have an elevated D-dimer of unclear significance.  CT angio chest PE study was negative for acute abnormality.  Presentation not consistent with dissection.  No ripping/tearing pain.  Patient is not significantly hypertensive.  No back pain.  Patient has requested no pain medication during her encounter.  No pneumonia on chest x-ray.  Unclear cause of chest pain.  Patient already follows with cardiology and plans to continue to follow with them.  I see no indication for further emergent workup or admission at this time.  Patient appears stable for discharge home.      Final diagnoses:  Chest pain, unspecified type    ED Discharge Orders     None          Logan Ubaldo KATHEE DEVONNA 11/25/23 0159    Trine Raynell Moder, MD 11/26/23 [phone]

## 2023-11-24 NOTE — ED Triage Notes (Signed)
 C/O [facility] as well.

## 2023-11-25 ENCOUNTER — Emergency Department (HOSPITAL_COMMUNITY): Payer: Self-pay

## 2023-11-25 LAB — D-DIMER, QUANTITATIVE: D-Dimer, Quant: 3.09 ug{FEU}/mL — ABNORMAL HIGH (ref 0.00–0.50)

## 2023-11-25 MED ORDER — IOHEXOL 350 MG/ML SOLN
75.0000 mL | Freq: Once | INTRAVENOUS | Status: AC | PRN
Start: 2023-11-25 — End: 2023-11-25
  Administered 2023-11-25: 75 mL via INTRAVENOUS

## 2023-11-25 NOTE — Discharge Instructions (Signed)
 Your workup this evening was reassuring. Please continue to follow up with your primary care provider and your cardiologist for further evaluation. Return to the emergency department if you develop any life threatening symptoms.

## 2023-11-25 NOTE — ED Provider Notes (Incomplete)
 [place] EMERGENCY DEPARTMENT AT [facility] Provider Note   CSN: [id number] Arrival date & time: 11/24/23  1123     Patient presents with: Chest Pain   Tina Melton is a 38 y.o. female.  Patient with past medical history significant for hypertension, anemia, panic attacks, obesity, recent evaluation for palpitations presents to the emergency department for evaluation of left-sided chest pain.  Patient states chest pain began on Saturday and has persisted over the weekend.  She states that it improves if she puts pressure on the left side of her chest.  It worsens with movement of left arm.  She denies any known musculoskeletal injury.  The patient states that she does feel short of breath.  Primary care has reportedly wanted to change her medication regimen but the patient is still breast-feeding her child due to a recent infection with RSV.  Pediatrician requested further breast-feeding to help with immunity.  The patient has an appointment with the pediatrician this Friday and is hopeful that she may be able to discontinue breast-feeding at that time so that she may change her other medications.  She states she has had multiple outpatient test for her heart with no specific findings.  She denies having a Holter monitor in the past.  Patient has no abdominal pain, nausea, vomiting.  She does endorse some vague paresthesias in the left hand and left foot.  {Add pertinent medical, surgical, social history, OB history to HPI:32947}  Chest Pain      Prior to Admission medications   Medication Sig Start Date End Date Taking? Authorizing Provider  amLODipine  (NORVASC ) 5 MG tablet Take 1 tablet (5 mg total) by mouth daily. 11/19/23   Caudle, Thersia Bitters, FNP  lisinopril  (ZESTRIL ) 40 MG tablet Take 1 tablet (40 mg total) by mouth daily. 03/19/23   Tobb, Kardie, DO  meloxicam (MOBIC) 15 MG tablet Take 1 tablet (15 mg total) by mouth daily for 15 days. 11/19/23 12/04/23  Caudle, Thersia Bitters, FNP    Allergies: Lemon juice, Lemon oil, and Gemtesa  [vibegron ]    Review of Systems  Cardiovascular:  Positive for chest pain.    Updated Vital Signs BP (!) 146/92 (BP Location: Right Arm)   Pulse 71   Temp 98 F (36.7 C) (Oral)   Resp 18   Ht 5' 11 (1.803 m)   Wt (!) 142.9 kg   LMP 10/30/2023 (Exact Date)   SpO2 100%   Breastfeeding Yes   BMI 43.93 kg/m   Physical Exam Vitals and nursing note reviewed.  Constitutional:      General: She is not in acute distress.    Appearance: She is well-developed. She is obese.  HENT:     Head: Normocephalic and atraumatic.  Eyes:     Conjunctiva/sclera: Conjunctivae normal.  Cardiovascular:     Rate and Rhythm: Normal rate and regular rhythm.     Heart sounds: No murmur heard. Pulmonary:     Effort: Pulmonary effort is normal. No respiratory distress.     Breath sounds: Normal breath sounds.  Chest:     Chest wall: No tenderness.  Abdominal:     Palpations: Abdomen is soft.     Tenderness: There is no abdominal tenderness.  Musculoskeletal:        General: No swelling.     Cervical back: Neck supple.  Skin:    General: Skin is warm and dry.  Neurological:     Mental Status: She is alert.  Psychiatric:  Mood and Affect: Mood normal.     (all labs ordered are listed, but only abnormal results are displayed) Labs Reviewed  BASIC METABOLIC PANEL WITH GFR - Abnormal; Notable for the following components:      Result Value   CO2 20 (*)    Calcium 8.6 (*)    All other components within normal limits  CBC - Abnormal; Notable for the following components:   Hemoglobin 11.8 (*)    MCH 25.8 (*)    All other components within normal limits  HCG, SERUM, QUALITATIVE  TROPONIN I (HIGH SENSITIVITY)  TROPONIN I (HIGH SENSITIVITY)    EKG: None  Radiology: DG Chest 2 View Result Date: 11/24/2023 CLINICAL DATA:  Left-sided chest and left arm pain. EXAM: CHEST - 2 VIEW COMPARISON:  04/24/2023. FINDINGS: The  heart size and mediastinal contours are within normal limits. No focal consolidation, pleural effusion, or pneumothorax. No acute osseous abnormality. IMPRESSION: No acute cardiopulmonary findings. Electronically Signed   By: Harrietta Sherry M.D.   On: 11/24/2023 12:45    {Document cardiac monitor, telemetry assessment procedure when appropriate:32947} Procedures   Medications Ordered in the ED - No data to display    {Click here for ABCD2, HEART and other calculators REFRESH Note before signing:1}                              Medical Decision Making Amount and/or Complexity of Data Reviewed Labs: ordered. Radiology: ordered.   This patient presents to the ED for concern of chest pain, this involves an extensive number of treatment options, and is a complaint that carries with it a high risk of complications and morbidity.  The differential diagnosis includes ACS, pneumonia, MSK pain, PE, dissection, anxiety, GERD, others   Co morbidities / Chronic conditions that complicate the patient evaluation  HTN   Additional history obtained:  Additional history obtained from EMR External records from outside source obtained and reviewed including primary care notes   Lab Tests:  I Ordered, and personally interpreted labs.  The pertinent results include:  ***   Imaging Studies ordered:  I ordered imaging studies including ***  I independently visualized and interpreted imaging which showed *** I agree with the radiologist interpretation   Cardiac Monitoring: / EKG:  The patient was maintained on a cardiac monitor.  I personally viewed and interpreted the cardiac monitored which showed an underlying rhythm of: ***   Problem List / ED Course / Critical interventions / Medication management  *** I ordered medication including ***   Reevaluation of the patient after these medicines showed that the patient *** I have reviewed the patients home medicines and have made  adjustments as needed   Consultations Obtained:  I requested consultation with the ***,  and discussed lab and imaging findings as well as pertinent plan - they recommend: ***   Social Determinants of Health:  ***   Test / Admission - Considered:  ***   {Document critical care time when appropriate  Document review of labs and clinical decision tools ie CHADS2VASC2, etc  Document your independent review of radiology images and any outside records  Document your discussion with family members, caretakers and with consultants  Document social determinants of health affecting pt's care  Document your decision making why or why not admission, treatments were needed:32947:::1}   Final diagnoses:  None    ED Discharge Orders     None

## 2023-11-26 ENCOUNTER — Encounter (HOSPITAL_BASED_OUTPATIENT_CLINIC_OR_DEPARTMENT_OTHER): Payer: Self-pay

## 2023-11-26 ENCOUNTER — Ambulatory Visit (INDEPENDENT_AMBULATORY_CARE_PROVIDER_SITE_OTHER): Payer: Self-pay | Admitting: Family Medicine

## 2023-11-26 ENCOUNTER — Ambulatory Visit: Payer: Self-pay | Attending: Family Medicine

## 2023-11-26 ENCOUNTER — Encounter (HOSPITAL_BASED_OUTPATIENT_CLINIC_OR_DEPARTMENT_OTHER): Payer: Self-pay | Admitting: Family Medicine

## 2023-11-26 VITALS — BP 129/81 | HR 70 | Ht 71.0 in | Wt 312.0 lb

## 2023-11-26 DIAGNOSIS — R002 Palpitations: Secondary | ICD-10-CM

## 2023-11-26 DIAGNOSIS — M79602 Pain in left arm: Secondary | ICD-10-CM | POA: Insufficient documentation

## 2023-11-26 DIAGNOSIS — R072 Precordial pain: Secondary | ICD-10-CM | POA: Insufficient documentation

## 2023-11-26 MED ORDER — CYCLOBENZAPRINE HCL 5 MG PO TABS: 5.0000 mg | ORAL_TABLET | Freq: Every evening | ORAL | 0 refills | Status: DC | PRN

## 2023-11-26 NOTE — Progress Notes (Unsigned)
EP

## 2023-11-26 NOTE — Progress Notes (Signed)
 Subjective:   Tina Melton 11-30-1985 11/26/2023  Chief Complaint  Patient presents with   Follow-up    Pt went to the ED due to having chest pain. Was told that everything was okay based off of the testing that was done. Has been having problems with her right calf being tight and states that she is still having occasional pains in her chest along with SOB.     HPI: Tina Melton presents today for re-assessment and management of chronic medical conditions.  Patient did go to ED on 11/24/2023 for chest pain and numbness in her left arm. She is frustrated due to long wait time in the ER. She did have a full cardiac evaluation with troponin monitoring, D dimer which was elevated, and negative CTA. She has continued to have intermittent chest pain present radiating to her left arm. She was recommended to follow up with Cardiology. She states she is having chest pains occurring in her sleep awakening her and causes shortness of breath. She is unable to tell if chest pain worsens with her left arm. Denies nausea, vomiting, sweating when pain comes on. She may have some mild dizziness at time of symptoms. She is having 1-2 episodes per day.   The following portions of the patient's history were reviewed and updated as appropriate: past medical history, past surgical history, family history, social history, allergies, medications, and problem list.   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   History of chicken pox    History of chlamydia    History of hematuria 09/18/2021   History of hemorrhage specific to perinatal period    6-7 mos bleeding x2, heavy bldg with 2nd preg was told baby nicked 2 blood vessels   History of pre-eclampsia in prior pregnancy, currently pregnant 01/14/2022   Baseline labs  ASA   Obesity    Panic attacks    Pregnancy induced hypertension    Pregnancy with history of ectopic pregnancy, antepartum 12/11/2021   Proteinuria 04/17/2011   Yeast infection    Past Surgical History:  Procedure Laterality Date   INDUCED ABORTION     LAPAROSCOPIC UNILATERAL SALPINGECTOMY Left 08/11/2021   Procedure: LAPAROSCOPIC LEFT SALPINGECTOMY WITH REMOVAL OF ECTOPIC PREGNANCY;  Surgeon: Izell Harari, MD;  Location: [facility] OR;  Service: Gynecology;  Laterality: Left;   LIPOMA EXCISION Right 10/21/2021   Procedure: EXCISION LIPOMA;  Surgeon: Desiderio Schanz, MD;  Location: [facility] ORS;  Service: General;  Laterality: Right;   Family History  Problem Relation Age of Onset   Mental illness Mother        schizophrenia and panic attacks   Rheum arthritis Father    Arthritis Father    Birth defects Son        extra digit   Hypertension Maternal Grandmother    Stroke Maternal Grandmother    Dementia Maternal Grandmother    Diabetes Maternal Grandfather    Rheum arthritis Paternal Grandmother    Arthritis Paternal Grandmother  Diabetes Paternal Grandmother    Prostate cancer Neg Hx    Bladder Cancer Neg Hx    Kidney cancer Neg Hx    Asthma Neg Hx    Heart disease Neg Hx    Outpatient Medications Prior to Visit  Medication Sig Dispense Refill   amLODipine  (NORVASC ) 5 MG tablet Take 1 tablet (5 mg total) by mouth daily.     lisinopril  (ZESTRIL ) 40 MG tablet Take 1 tablet (40 mg total) by mouth daily.     meloxicam (MOBIC) 15 MG tablet Take 1 tablet (15 mg total) by mouth daily for 15 days. 15 tablet 0   No  facility-administered medications prior to visit.   Allergies  Allergen Reactions   Lemon Juice Anaphylaxis   Lemon Oil Anaphylaxis   Gemtesa  [Vibegron ] Rash     ROS: A complete ROS was performed with pertinent positives/negatives noted in the HPI. The remainder of the ROS are negative.    Objective:   Today's Vitals   11/26/23 0842 11/26/23 0917  BP: (!) 142/83 129/81  Pulse: 70   SpO2: 100%   Weight: (!) 312 lb (141.5 kg)   Height: 5' 11 (1.803 m)     Physical Exam   GENERAL: Well-appearing, in NAD. Well nourished.  SKIN: Pink, warm and dry.  Head: Normocephalic. NECK: Trachea midline. Full ROM w/o pain or tenderness.  RESPIRATORY: Chest wall symmetrical. Respirations even and non-labored. Breath sounds clear to auscultation bilaterally.  CARDIAC: S1, S2 present, regular rate and rhythm without murmur or gallops. Peripheral pulses 2+ bilaterally.  MSK: Muscle tone and strength appropriate for age. Moderate muscle tightness to left trapezius and rhomboid with palpation. Decreased ROM to LUE due to pain and tightness. No deformity or decreased strength in LUE.  EXTREMITIES: Without clubbing, cyanosis, or edema.  NEUROLOGIC: No motor or sensory deficits. Steady, even gait. C2-C12 intact.  PSYCH/MENTAL STATUS: Alert, oriented x 3. Cooperative, appropriate mood and affect.   Health Maintenance Due  Topic Date Due   COVID-19 Vaccine (1) Never done   Mammogram  Never done   HPV VACCINES (1 - 3-dose SCDM series) Never done   DTaP/Tdap/Td (9 - Td or Tdap) 04/06/2022    No results found for any visits on 11/26/23.  The ASCVD Risk score (Arnett DK, et al., 2019) failed to calculate for the following reasons:   Cannot find a previous HDL lab   Cannot find a previous total cholesterol lab     Assessment & Plan:  1. Palpitations (Primary) 2. Precordial pain Discussed patient's work up and will obtain Zio patch given episodes of chest pain and palpitations. I recommend she  follow up with Cardiology for concern of ischemic pain. We discussed pain may be musculoskeletal in nature versus cardiac and she would like to proceed with further cardiac evaluation. PCP did reach out to [facility] and they will call to schedule. Zio ordered. Red flag signs and symptoms to go to ER reviewed.  - LONG TERM MONITOR (3-14 DAYS); Future  3. Musculoskeletal pain of left upper extremity Pain present to left rhomboid and trapezius upon exam. This may be contributing to radiating chest and LUE pain. Will start Meloxicam as she is no longer breastfeeding for 14 days and use Flexeril as needed at night. Safe use reviewed. Stretching provided and recommend heating pad daily as well. If no improvement in 2 weeks, reach out to PCP.     Meds ordered this encounter  Medications   cyclobenzaprine (FLEXERIL) 5 MG  tablet    Sig: Take 1 tablet (5 mg total) by mouth at bedtime as needed for muscle spasms.    Dispense:  30 tablet    Refill:  0    Supervising Provider:   DE PERU, RAYMOND J [[id number]]   Lab Orders  No laboratory test(s) ordered today   No images are attached to the encounter or orders placed in the encounter.  Return if symptoms worsen or fail to improve.    Patient to reach out to office if new, worrisome, or unresolved symptoms arise or if no improvement in patient's condition. Patient verbalized understanding and is agreeable to treatment plan. All questions answered to patient's satisfaction.    Thersia Schuyler Stark, [place]

## 2023-11-26 NOTE — Patient Instructions (Addendum)
 Take Meloxicam if no longer breastfeeding    Schedule with cardiology    Complete Zio patch study

## 2023-12-01 NOTE — Telephone Encounter (Signed)
 Alexis, please see recent mychart sent by pt and advise on what she needs to do since she states she has not received the monitor yet.

## 2023-12-08 ENCOUNTER — Other Ambulatory Visit (HOSPITAL_BASED_OUTPATIENT_CLINIC_OR_DEPARTMENT_OTHER): Payer: Self-pay | Admitting: Family Medicine

## 2023-12-15 NOTE — Progress Notes (Deleted)
  Cardiology Office Note   Date:  12/15/2023  ID:  Tina Melton, Tina Melton Jan 31, 1986, MRN [id number] PCP: Tina Thersia Bitters, FNP  [facility] Providers Cardiologist:  Dub Huntsman, DO    History of Present Illness Tina Melton is a 38 y.o. female with past medical history of postpartum hypertension, palpitations, anemia.  Followed by Dr. Huntsman and presents today for a 51-month follow-up appointment.  Patient previously had an echocardiogram completed in 11/2022 that showed EF 54%, no regional wall motion abnormalities, normal LV diastolic parameters, normal RV systolic function.  She has been on amlodipine  and lisinopril  for blood pressure management.  Patient was seen in the ED on 11/24/2023 complaining of left arm and left sided chest pain.  Pain worsened whenever she tried to move her arm.  In the ED, high-sensitivity troponin was negative x 2.  K4.1, creatinine 0.78, WBC 5.7, hemoglobin 11.8.  D-dimer elevated.  CTA chest showed no PE, no acute intrathoracic pathology.  There were no significant coronary artery calcifications noted.  When seen by her PCP few days later, patient reported tightness in her right calf palpitations.  Ordered a Zio patch  Palpitations   HTN  - Continue amlodipine  5 mg daily, lisinopril  40 mg daily - K4.1, creatinine 0.76 on 11/24/2023  Left should pain  - Seen in the ED on 10/8 for left arm/shoulder pain.  Troponin negative x 2.  Pain worse when trying to move her arm.  PCP started meloxicam and Flexeril  ROS: ***  Studies Reviewed      *** Risk Assessment/Calculations {Does this patient have ATRIAL FIBRILLATION?:[id number]} No BP recorded.  {Refresh Note OR Click here to enter BP  :1}***       Physical Exam VS:  There were no vitals taken for this visit.       Wt Readings from Last 3 Encounters:  11/26/23 (!) 312 lb (141.5 kg)  11/24/23 (!) 315 lb (142.9 kg)  11/19/23 (!) 319 lb 12.8 oz (145.1 kg)    GEN: Well nourished, well  developed in no acute distress NECK: No JVD; No carotid bruits CARDIAC: ***RRR, no murmurs, rubs, gallops RESPIRATORY:  Clear to auscultation without rales, wheezing or rhonchi  ABDOMEN: Soft, non-tender, non-distended EXTREMITIES:  No edema; No deformity   ASSESSMENT AND PLAN ***    {Are you ordering a CV Procedure (e.g. stress test, cath, DCCV, TEE, etc)?   Press F2        :[id number]}  Dispo: ***  Signed, Rollo FABIENE Louder, PA-C

## 2023-12-23 ENCOUNTER — Other Ambulatory Visit (HOSPITAL_BASED_OUTPATIENT_CLINIC_OR_DEPARTMENT_OTHER): Payer: Self-pay | Admitting: Family Medicine

## 2023-12-28 ENCOUNTER — Ambulatory Visit: Payer: Self-pay | Admitting: Cardiology

## 2023-12-29 DIAGNOSIS — R002 Palpitations: Secondary | ICD-10-CM

## 2023-12-29 DIAGNOSIS — R072 Precordial pain: Secondary | ICD-10-CM

## 2023-12-30 ENCOUNTER — Ambulatory Visit (HOSPITAL_BASED_OUTPATIENT_CLINIC_OR_DEPARTMENT_OTHER): Payer: Self-pay | Admitting: Family Medicine

## 2023-12-30 NOTE — Progress Notes (Signed)
 Hi Tina Melton,  Your zio patch did not show any arrhythmias or irregular rhythms. You had 1 episode of an increased heart rate lasting only 5 beats. Your symptom triggered episodes did not show any abnormal beats or rhythms when symptoms occurred. Please follow up with Cardiology as scheduled on 01/19/2024 to discuss further evaluation.

## 2024-01-18 ENCOUNTER — Encounter: Payer: Self-pay | Admitting: *Deleted

## 2024-01-19 ENCOUNTER — Ambulatory Visit: Payer: Self-pay | Admitting: Cardiology

## 2024-01-20 ENCOUNTER — Ambulatory Visit: Payer: Self-pay | Attending: Cardiology | Admitting: Cardiology

## 2024-01-20 ENCOUNTER — Encounter: Payer: Self-pay | Admitting: Cardiology

## 2024-01-20 VITALS — BP 126/78 | HR 82 | Ht 71.0 in | Wt 314.0 lb

## 2024-01-20 DIAGNOSIS — Z13220 Encounter for screening for lipoid disorders: Secondary | ICD-10-CM

## 2024-01-20 DIAGNOSIS — I10 Essential (primary) hypertension: Secondary | ICD-10-CM

## 2024-01-20 DIAGNOSIS — R002 Palpitations: Secondary | ICD-10-CM

## 2024-01-20 NOTE — Patient Instructions (Signed)
 Medication Instructions:  Your physician recommends that you continue on your current medications as directed. Please refer to the Current Medication list given to you today.  *If you need a refill on your cardiac medications before your next appointment, please call your pharmacy*  Lab Work: Lipids, Lp(a) If you have labs (blood work) drawn today and your tests are completely normal, you will receive your results only by: MyChart Message (if you have MyChart) OR A paper copy in the mail If you have any lab test that is abnormal or we need to change your treatment, we will call you to review the results.  Follow-Up: At [facility], you and your health needs are our priority.  As part of our continuing mission to provide you with exceptional heart care, our providers are all part of one team.  This team includes your primary Cardiologist (physician) and Advanced Practice Providers or APPs (Physician Assistants and Nurse Practitioners) who all work together to provide you with the care you need, when you need it.  Your next appointment:   1 year(s)  Provider:   Kardie Tobb, DO

## 2024-01-20 NOTE — Progress Notes (Signed)
 Cardio-Obstetrics Clinic  New Evaluation  Date:  01/20/2024   ID:  Tina Melton, Tina Melton Jun 24, 1985, MRN [id number]  PCP:  Knute Thersia Bitters, FNP    [facility] Providers Cardiologist:  Dub Huntsman, DO  Electrophysiologist:  None       Referring MD: Knute Thersia Bitters, *   Chief Complaint:  I am ok  History of Present Illness:    Tina Melton is a 38 y.o. female [[id number]] who is being seen today for a follow up.  Medical history includes hypertension, morbid obesity.    At her visit in 05/2023 we stopped the lisinopril . Continue the Amlodipine . She is her today for a follow up visit.    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   History of chicken pox    History of chlamydia    History of hematuria 09/18/2021   History of hemorrhage specific to perinatal period    6-7 mos bleeding x2, heavy bldg with 2nd preg was told baby nicked 2 blood vessels   History of pre-eclampsia in prior pregnancy, currently pregnant 01/14/2022   Baseline labs  ASA   Obesity    Panic attacks    Pregnancy induced hypertension    Pregnancy with history of ectopic pregnancy, antepartum 12/11/2021   Proteinuria 04/17/2011   Yeast infection     Past Surgical History:  Procedure Laterality Date   INDUCED ABORTION     LAPAROSCOPIC UNILATERAL SALPINGECTOMY Left 08/11/2021   Procedure: LAPAROSCOPIC LEFT SALPINGECTOMY WITH REMOVAL OF ECTOPIC PREGNANCY;  Surgeon: Izell Harari, MD;  Location: [facility] OR;  Service: Gynecology;  Laterality: Left;   LIPOMA EXCISION Right 10/21/2021   Procedure: EXCISION LIPOMA;  Surgeon: Desiderio Schanz, MD;  Location: [facility] ORS;  Service: General;  Laterality: Right;      OB History     Gravida  9    Para  3   Term  3   Preterm  0   AB  6   Living  3      SAB  1   IAB  4   Ectopic  1   Multiple  0   Live Births  3               Current Medications: Current Meds  Medication Sig   amLODipine  (NORVASC ) 5 MG tablet Take 1 tablet (5 mg total) by mouth daily.   cyclobenzaprine  (FLEXERIL ) 5 MG tablet TAKE 1 TABLET BY MOUTH AT BEDTIME AS NEEDED FOR MUSCLE SPASMS.   lisinopril  (ZESTRIL ) 40 MG tablet Take 1 tablet (40 mg total) by mouth daily.   meloxicam  (MOBIC ) 15 MG tablet TAKE 1 TABLET BY MOUTH DAILY FOR 15 DAYS     Allergies:   Lemon juice, Lemon oil, and Gemtesa  [vibegron ]   Social History   Socioeconomic History   Marital status: Single    Spouse name: Not on file   Number of children: 2   Years of education: Not on file   Highest education level: Not on file  Occupational History   Occupation: mental health therapist  Tobacco Use   Smoking status: Never   Smokeless tobacco: Never  Vaping Use   Vaping status: Never Used  Substance and Sexual Activity   Alcohol use: No   Drug  use: Not Currently    Comment: marijuana years ago   Sexual activity: Yes    Birth control/protection: None  Other Topics Concern   Not on file  Social History Narrative   Not on file   Social Drivers of Health   Financial Resource Strain: Not on file  Food Insecurity: No Food Insecurity (07/15/2022)   Hunger Vital Sign    Worried About Running Out of Food in the Last Year: Never true    Ran Out of Food in the Last Year: Never true  Transportation Needs: No Transportation Needs (07/15/2022)   PRAPARE - Administrator, Civil Service (Medical): No    Lack of Transportation (Non-Medical): No  Physical Activity: Not on file  Stress: Not on file  Social Connections: Unknown (06/27/2021)   Received from [facility]   Social Network    Social Network: Not on file      Family History  Problem Relation Age of Onset   Mental illness Mother         schizophrenia and panic attacks   Rheum arthritis Father    Arthritis Father    Birth defects Son        extra digit   Hypertension Maternal Grandmother    Stroke Maternal Grandmother    Dementia Maternal Grandmother    Diabetes Maternal Grandfather    Rheum arthritis Paternal Grandmother    Arthritis Paternal Grandmother    Diabetes Paternal Grandmother    Prostate cancer Neg Hx    Bladder Cancer Neg Hx    Kidney cancer Neg Hx    Asthma Neg Hx    Heart disease Neg Hx       ROS:   Please see the history of present illness.     All other systems reviewed and are negative.   Labs/EKG Reviewed:    EKG:   EKG was not  ordered today.    Recent Labs: 05/18/2023: ALT 15 06/28/2023: TSH 0.807 11/19/2023: Magnesium  2.0 11/24/2023: BUN 16; Creatinine, Ser 0.74; Hemoglobin 11.8; Platelets 349; Potassium 4.1; Sodium 136   Recent Lipid Panel No results found for: CHOL, TRIG, HDL, CHOLHDL, LDLCALC, LDLDIRECT  Physical Exam:    VS:  BP 126/78 (BP Location: Left Arm, Patient Position: Sitting, Cuff Size: Large)   Pulse 82   Ht 5' 11 (1.803 m)   Wt (!) 314 lb (142.4 kg)   SpO2 99%   BMI 43.79 kg/m     Wt Readings from Last 3 Encounters:  01/20/24 (!) 314 lb (142.4 kg)  11/26/23 (!) 312 lb (141.5 kg)  11/24/23 (!) 315 lb (142.9 kg)     GEN:  Well nourished, well developed in no acute distress HEENT: Normal NECK: No JVD; No carotid bruits LYMPHATICS: No lymphadenopathy CARDIAC: RRR, no murmurs, rubs, gallops RESPIRATORY:  Clear to auscultation without rales, wheezing or rhonchi  ABDOMEN: Soft, non-tender, non-distended MUSCULOSKELETAL:  No edema; No deformity  SKIN: Warm and dry NEUROLOGIC:  Alert and oriented x 3 PSYCHIATRIC:  Normal affect    Risk Assessment/Risk Calculators:                ASSESSMENT & PLAN:    Chronic hypertension - blood pressure is at target, continue with current medication regime.   Obesity -the patient understands the need  to lose weight with diet and exercise. We have discussed specific strategies for this.    There are no Patient Instructions on file for this visit.   Dispo:  No  follow-ups on file.   Medication Adjustments/Labs and Tests Ordered: Current medicines are reviewed at length with the patient today.  Concerns regarding medicines are outlined above.  Tests Ordered: No orders of the defined types were placed in this encounter.  Medication Changes: No orders of the defined types were placed in this encounter.

## 2024-01-21 ENCOUNTER — Telehealth: Payer: Self-pay | Admitting: Licensed Clinical Social Worker

## 2024-01-21 NOTE — Telephone Encounter (Signed)
 H&V Care Navigation CSW Progress Note  Clinical Social Worker contacted patient by phone to f/u on self pay status- previously had Medicaid. Received call back from pt after leaving a voicemail at [phone]. Confirmed full name, DOB, and current home address. Pt resides with children, shares she had Medicaid during her pregnancy but her current income makes her ineligible for Medicaid otherwise. Employer does not offer insurance at this time. Pt has reviewed marketplace and believes it is too expensive for her at this time. We discussed CAFA to assist with recent bills and ongoing outpatient care if interested. At this time pt shares she gets her medications without issues but may be more cost effective at [facility] pharmacy.   Sent pt a copy of my card, [facility] and list of [facility] generic medications on $5 list should she want to use [facility] pharmacy moving forward. No additional questions, will f/u to ensure pt receives paperwork and answer any additional questions.  Patient is participating in a Managed Medicaid Plan:  No, self pay only  SDOH Screenings   Food Insecurity: No Food Insecurity (01/21/2024)  Housing: Low Risk  (01/21/2024)  Transportation Needs: No Transportation Needs (01/21/2024)  Utilities: Not At Risk (07/15/2022)  Depression (PHQ2-9): Medium Risk (11/19/2023)  Financial Resource Strain: Low Risk  (01/21/2024)  Social Connections: Unknown (06/27/2021)   Received from [facility]  Tobacco Use: Low Risk  (01/20/2024)  Health Literacy: Adequate Health Literacy (01/21/2024)    Marit Lark, MSW, LCSW Clinical Social Worker II [facility] Heart/Vascular Care Navigation  [phone]- work cell phone (preferred)

## 2024-01-22 LAB — LIPID PANEL
Chol/HDL Ratio: 3.6 ratio (ref 0.0–4.4)
Cholesterol, Total: 243 mg/dL — ABNORMAL HIGH (ref 100–199)
HDL: 67 mg/dL (ref >39)
LDL Chol Calc (NIH): 159 mg/dL — ABNORMAL HIGH (ref 0–99)
Triglycerides: 97 mg/dL (ref 0–149)
VLDL Cholesterol Cal: 17 mg/dL (ref 5–40)

## 2024-01-22 LAB — LIPOPROTEIN A (LPA): Lipoprotein (a): 218.6 nmol/L — AB (ref <75.0)

## 2024-02-09 ENCOUNTER — Ambulatory Visit: Payer: Self-pay | Admitting: Cardiology

## 2024-03-14 ENCOUNTER — Encounter (HOSPITAL_BASED_OUTPATIENT_CLINIC_OR_DEPARTMENT_OTHER): Payer: Self-pay | Admitting: Family Medicine

## 2024-03-14 ENCOUNTER — Ambulatory Visit (INDEPENDENT_AMBULATORY_CARE_PROVIDER_SITE_OTHER): Payer: Self-pay | Admitting: Family Medicine

## 2024-03-14 VITALS — BP 117/72 | HR 73 | Ht 71.0 in | Wt 311.8 lb

## 2024-03-14 DIAGNOSIS — G43709 Chronic migraine without aura, not intractable, without status migrainosus: Secondary | ICD-10-CM | POA: Insufficient documentation

## 2024-03-14 DIAGNOSIS — R400 Somnolence: Secondary | ICD-10-CM

## 2024-03-14 MED ORDER — CYCLOBENZAPRINE HCL 5 MG PO TABS: 2.5000 mg | ORAL_TABLET | Freq: Every evening | ORAL | 0 refills | Status: AC | PRN

## 2024-03-14 MED ORDER — NAPROXEN 250 MG PO TABS: 250.0000 mg | ORAL_TABLET | Freq: Two times a day (BID) | ORAL | 0 refills | Status: AC

## 2024-03-14 NOTE — Progress Notes (Signed)
 "    Subjective:   Tina Melton [birth year] 03/14/2024  Chief Complaint  Patient presents with   Headache    Pt has been having problems with headaches and also states she has been feeling dizzy from them. Denies any problems with BP.    Discussed the use of AI scribe software for clinical note transcription with the patient, who gave verbal consent to proceed.  History of Present Illness Tina Melton is a 39 year old female who presents with frequent, severe headaches.  She experiences frequent, severe headaches characterized by a pounding sensation, blurred vision, and extreme fatigue. An episode yesterday was particularly intense, with a sensation of near-fainting and a headache lasting four to five days. The frequency of these headaches has increased. The headaches began around Christmas while at the beach and have been recurring since. During the week leading into New Year's, she experienced daily headaches, which then occurred every other day in the following weeks. Recently, the frequency has increased further.  She has attempted to manage the headaches with over-the-counter medications. Initially, she used 500 mg of Tylenol  Extra Strength without relief, then switched to Excedrin Migraine, which provided some relief after a couple of days. She typically takes 1000 mg of Tylenol  when needed, but it was ineffective during recent episodes.  During the headaches, she experiences sensitivity to light and nausea, significantly impacting her daily activities, including her ability to work and care for her children.Her blood pressure was initially normal at 120/82 mmHg but increased to 150/95 mmHg as the headaches persisted, possibly due to stress.  No family history of migraines. She does not snore at night, although she used to when she was heavier. She wears a retainer due to previous braces and notes that her jaw often feels tight and painful, especially in the mornings, which she  suspects may contribute to her headaches.  STOP-BANG Score: 3 Points- High Risk   The following portions of the patient's history were reviewed and updated as appropriate: past medical history, past surgical history, family history, social history, allergies, medications, and problem list.   Patient Active Problem List   Diagnosis Date Noted   Chronic migraine without aura without status migrainosus, not intractable 03/14/2024   Musculoskeletal pain of left upper extremity 11/26/2023   Precordial pain 11/26/2023   Iron deficiency 06/29/2023   Primary hypertension 11/13/2022   Preeclampsia 07/16/2022   Subclinical hypothyroidism 02/24/2022   History of gestational hypertension 02/19/2022   BMI 40.0-44.9, adult (HCC) 05/29/2011   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   History of chicken pox    History of chlamydia    History of hematuria 09/18/2021   History of hemorrhage specific to perinatal period    6-7 mos bleeding x2, heavy bldg with 2nd preg was told baby nicked 2 blood vessels   History of pre-eclampsia in prior pregnancy, currently pregnant 01/14/2022   Baseline labs  ASA   Obesity    Panic attacks    Pregnancy induced hypertension    Pregnancy with history of ectopic pregnancy, antepartum 12/11/2021   Proteinuria 04/17/2011   Yeast infection    Past Surgical History:  Procedure Laterality Date   INDUCED ABORTION     LAPAROSCOPIC UNILATERAL SALPINGECTOMY Left 08/11/2021   Procedure: LAPAROSCOPIC LEFT SALPINGECTOMY WITH REMOVAL OF ECTOPIC PREGNANCY;  Surgeon:  Izell Harari, MD;  Location: [facility] OR;  Service: Gynecology;  Laterality: Left;   LIPOMA EXCISION Right 10/21/2021   Procedure: EXCISION LIPOMA;  Surgeon: Desiderio Schanz, MD;  Location: [facility] ORS;  Service: General;  Laterality: Right;    Family History  Problem Relation Age of Onset   Mental illness Mother        schizophrenia and panic attacks   Rheum arthritis Father    Arthritis Father    Birth defects Son        extra digit   Hypertension Maternal Grandmother    Stroke Maternal Grandmother    Dementia Maternal Grandmother    Diabetes Maternal Grandfather    Rheum arthritis Paternal Grandmother    Arthritis Paternal Grandmother    Diabetes Paternal Grandmother    Prostate cancer Neg Hx    Bladder Cancer Neg Hx    Kidney cancer Neg Hx    Asthma Neg Hx    Heart disease Neg Hx    Outpatient Medications Prior to Visit  Medication Sig Dispense Refill   amLODipine  (NORVASC ) 5 MG tablet Take 1 tablet (5 mg total) by mouth daily.     lisinopril  (ZESTRIL ) 40 MG tablet Take 1 tablet (40 mg total) by mouth daily.     meloxicam  (MOBIC ) 15 MG tablet TAKE 1 TABLET BY MOUTH DAILY FOR 15 DAYS (Patient taking differently: as needed for pain.) 15 tablet 0   cyclobenzaprine  (FLEXERIL ) 5 MG tablet TAKE 1 TABLET BY MOUTH AT BEDTIME AS NEEDED FOR MUSCLE SPASMS. 30 tablet 0   No facility-administered medications prior to visit.   Allergies[1]   ROS: A complete ROS was performed with pertinent positives/negatives noted in the HPI. The remainder of the ROS are negative.    Objective:   Today's Vitals   03/14/24 1032  BP: 117/72  Pulse: 73  SpO2: 99%  Weight: (!) 311 lb 12.8 oz (141.4 kg)  Height: 5' 11 (1.803 m)    Physical Exam   GENERAL: Well-appearing, in NAD. Well nourished.  SKIN: Pink, warm and dry.  Head: Normocephalic. No clicking, popping or deformity with jaw opening. Mild tenderness to right TMJ with palpation and with opening/closing of jaw.  NECK: Trachea midline. Full ROM w/o pain or tenderness.  RESPIRATORY: Chest wall symmetrical. Respirations even and non-labored.  MSK: Muscle tone and strength appropriate for age. Joints w/o tenderness, redness, or swelling.  EXTREMITIES: Without clubbing,  cyanosis, or edema.  NEUROLOGIC: No motor or sensory deficits. Steady, even gait. C2-C12 intact.  PSYCH/MENTAL STATUS: Alert, oriented x 3. Cooperative, appropriate mood and affect.       Assessment & Plan:   1. Chronic migraine without aura without status migrainosus, not intractable (Primary) Possible TMJ contributing to new onset of headaches. Concern of OSA arising given morbid obesity, daytime somnolence and hx of HTN and snoring. Will treat possible TMJ with Naproxen  250mg  BID x 10 days and use of Flexeril  as needed. Safe use reviewed with patient. She has f/u with dentist in 1 month.   - Ambulatory referral to Pulmonology - naproxen  (NAPROSYN ) 250 MG tablet; Take 1 tablet (250 mg total) by mouth 2 (two) times daily with a meal for 10 days. Do not take with ibuprofen  or meloxicam   Dispense: 20 tablet; Refill: 0 - cyclobenzaprine  (FLEXERIL ) 5 MG tablet; Take 0.5-1 tablets (2.5-5 mg total) by mouth at bedtime as needed (TMJ pain).  Dispense: 30 tablet; Refill: 0  2. Daytime somnolence I recommend she proceed with a sleep evaluation as well  and patient agreeable with referral placed to Pulm given STOP BANG score and new onset headaches, fatigue, and hx of obesity, HTN    Meds ordered this encounter  Medications   naproxen  (NAPROSYN ) 250 MG tablet    Sig: Take 1 tablet (250 mg total) by mouth 2 (two) times daily with a meal for 10 days. Do not take with ibuprofen  or meloxicam     Dispense:  20 tablet    Refill:  0    Supervising Provider:   DE CUBA, RAYMOND J [[id number]]   cyclobenzaprine  (FLEXERIL ) 5 MG tablet    Sig: Take 0.5-1 tablets (2.5-5 mg total) by mouth at bedtime as needed (TMJ pain).    Dispense:  30 tablet    Refill:  0    Supervising Provider:   DE CUBA, RAYMOND J [[id number]]   Lab Orders  No laboratory test(s) ordered today   No images are attached to the encounter or orders placed in the encounter.  Return if symptoms worsen or fail to improve.    Patient to  reach out to office if new, worrisome, or unresolved symptoms arise or if no improvement in patient's condition. Patient verbalized understanding and is agreeable to treatment plan. All questions answered to patient's satisfaction.    Thersia Schuyler Stark, FNP    [1]  Allergies Allergen Reactions   Lemon Juice Anaphylaxis   Lemon Oil Anaphylaxis   Gemtesa  [Vibegron ] Rash   "

## 2024-03-31 ENCOUNTER — Encounter (HOSPITAL_BASED_OUTPATIENT_CLINIC_OR_DEPARTMENT_OTHER): Admitting: Family Medicine

## 2024-06-12 ENCOUNTER — Ambulatory Visit (HOSPITAL_BASED_OUTPATIENT_CLINIC_OR_DEPARTMENT_OTHER): Payer: Self-pay | Admitting: Pulmonary Disease
